# Patient Record
Sex: Female | Born: 1975 | Race: Black or African American | Hispanic: No | Marital: Single | Smoking: Never smoker
Health system: Southern US, Community
[De-identification: ages and names within clinical notes are randomized; demographics above are authoritative.]

## PROBLEM LIST (undated history)

## (undated) DIAGNOSIS — C792 Secondary malignant neoplasm of skin: Secondary | ICD-10-CM

## (undated) DIAGNOSIS — G47419 Narcolepsy without cataplexy: Secondary | ICD-10-CM

## (undated) DIAGNOSIS — C787 Secondary malignant neoplasm of liver and intrahepatic bile duct: Secondary | ICD-10-CM

## (undated) DIAGNOSIS — L509 Urticaria, unspecified: Secondary | ICD-10-CM

## (undated) DIAGNOSIS — C50919 Malignant neoplasm of unspecified site of unspecified female breast: Secondary | ICD-10-CM

## (undated) DIAGNOSIS — M419 Scoliosis, unspecified: Secondary | ICD-10-CM

## (undated) DIAGNOSIS — C50912 Malignant neoplasm of unspecified site of left female breast: Secondary | ICD-10-CM

## (undated) DIAGNOSIS — N3941 Urge incontinence: Secondary | ICD-10-CM

## (undated) HISTORY — DX: Urticaria, unspecified: L50.9

## (undated) HISTORY — DX: Secondary malignant neoplasm of skin: C79.2

## (undated) HISTORY — PX: HERNIA REPAIR: SHX51

## (undated) HISTORY — DX: Secondary malignant neoplasm of liver and intrahepatic bile duct: C78.7

## (undated) HISTORY — PX: SENTINEL NODE BIOPSY: SHX6608

## (undated) HISTORY — DX: Malignant neoplasm of unspecified site of left female breast: C50.912

## (undated) HISTORY — DX: Malignant neoplasm of unspecified site of unspecified female breast: C50.919

---

## 2016-01-03 HISTORY — PX: BREAST BIOPSY: SHX20

## 2017-10-21 DIAGNOSIS — C50812 Malignant neoplasm of overlapping sites of left female breast: Secondary | ICD-10-CM | POA: Insufficient documentation

## 2017-10-21 DIAGNOSIS — Z171 Estrogen receptor negative status [ER-]: Secondary | ICD-10-CM | POA: Insufficient documentation

## 2018-02-13 ENCOUNTER — Other Ambulatory Visit (HOSPITAL_COMMUNITY): Payer: Self-pay | Admitting: Internal Medicine

## 2018-02-13 DIAGNOSIS — C50919 Malignant neoplasm of unspecified site of unspecified female breast: Secondary | ICD-10-CM

## 2018-02-20 ENCOUNTER — Encounter (HOSPITAL_COMMUNITY): Payer: Self-pay

## 2018-02-20 ENCOUNTER — Ambulatory Visit (HOSPITAL_COMMUNITY)
Admission: RE | Admit: 2018-02-20 | Discharge: 2018-02-20 | Disposition: A | Discharge status: Home / Self Care | Source: Ambulatory Visit | Attending: Internal Medicine | Admitting: Internal Medicine

## 2018-02-20 DIAGNOSIS — C773 Secondary and unspecified malignant neoplasm of axilla and upper limb lymph nodes: Secondary | ICD-10-CM | POA: Insufficient documentation

## 2018-02-20 DIAGNOSIS — Z9889 Other specified postprocedural states: Secondary | ICD-10-CM | POA: Insufficient documentation

## 2018-02-20 DIAGNOSIS — C781 Secondary malignant neoplasm of mediastinum: Secondary | ICD-10-CM | POA: Diagnosis not present

## 2018-02-20 DIAGNOSIS — C50912 Malignant neoplasm of unspecified site of left female breast: Secondary | ICD-10-CM | POA: Insufficient documentation

## 2018-02-20 DIAGNOSIS — C787 Secondary malignant neoplasm of liver and intrahepatic bile duct: Secondary | ICD-10-CM | POA: Insufficient documentation

## 2018-02-20 DIAGNOSIS — J90 Pleural effusion, not elsewhere classified: Secondary | ICD-10-CM | POA: Diagnosis not present

## 2018-02-20 DIAGNOSIS — C50919 Malignant neoplasm of unspecified site of unspecified female breast: Secondary | ICD-10-CM

## 2018-02-20 DIAGNOSIS — C771 Secondary and unspecified malignant neoplasm of intrathoracic lymph nodes: Secondary | ICD-10-CM | POA: Insufficient documentation

## 2018-02-20 DIAGNOSIS — R590 Localized enlarged lymph nodes: Secondary | ICD-10-CM | POA: Insufficient documentation

## 2018-02-20 LAB — GLUCOSE, CAPILLARY: Glucose-Capillary: 78 mg/dL (ref 65–99)

## 2018-02-20 MED ORDER — FLUDEOXYGLUCOSE F - 18 (FDG) INJECTION
5.5000 | Freq: Once | INTRAVENOUS | Status: AC | PRN
  Administered 2018-02-20: 5.5 via INTRAVENOUS

## 2018-08-05 ENCOUNTER — Encounter (HOSPITAL_COMMUNITY): Payer: Self-pay

## 2018-08-05 ENCOUNTER — Emergency Department (HOSPITAL_COMMUNITY)
Admission: EM | Admit: 2018-08-05 | Discharge: 2018-08-05 | Disposition: A | Discharge status: Home / Self Care | Attending: Emergency Medicine | Admitting: Emergency Medicine

## 2018-08-05 ENCOUNTER — Other Ambulatory Visit: Payer: Self-pay

## 2018-08-05 DIAGNOSIS — T7840XA Allergy, unspecified, initial encounter: Secondary | ICD-10-CM | POA: Diagnosis not present

## 2018-08-05 HISTORY — DX: Scoliosis, unspecified: M41.9

## 2018-08-05 HISTORY — DX: Narcolepsy without cataplexy: G47.419

## 2018-08-05 MED ORDER — METHYLPREDNISOLONE 4 MG PO TBPK: ORAL_TABLET | ORAL | 0 refills | Status: DC

## 2018-08-05 MED ORDER — EPINEPHRINE 0.3 MG/0.3ML IJ SOAJ: 0.3000 mg | Freq: Once | INTRAMUSCULAR | 0 refills | Status: AC

## 2018-08-05 NOTE — Discharge Instructions (Addendum)
Try taking scheduled benadryl. If that doesn't help, I've given you a prescription for a steroid taper that you can try. Please follow up with your outpatient doctor to discuss restarting your home medications/supplements.   If you experience worsening swelling that leads to difficulty breathing, return to the emergency department.

## 2018-08-05 NOTE — ED Triage Notes (Signed)
Pt c/o mouth and tongue swelling. Pt is on treatment for breast cancer. Pt started new medication and noticed Monday that her right of her mouth was swelling. Pt took benadryl and symptoms resolved. Then the next day left side of mouth was swollen. Pt denies SOB or difficulty.

## 2018-08-05 NOTE — ED Provider Notes (Signed)
[facility] Provider Note   CSN: [id number] Arrival date & time: 08/05/18  3086     History   Chief Complaint Chief Complaint  Patient presents with  . Allergic Reaction    HPI Taylor Flynn is a 42 y.o. female with ER-/PR-/HER2+ stage IV breast cancer currently receiving chemo infusions (paclitaxel, trastuzumab, pertuzumab) along with a long list of herbs/supplements who presents for evaluation of mouth swelling.   She reports starting a new medication Sunday (methotrexate 2.35m every Sunday/Wednesday) and woke up the next morning with right sided lip swelling that resolved with benadryl. The next morning she woke up with left sided lip swelling that resolved on its own throughout the day. This morning she woke up with left sided tongue and left buccal swelling. She reports difficulty speaking but no respiratory distress. She was concerned about the migratory nature of her swelling and the fact that it has been traveling towards her oropharynx.   She has stopped taking all her medications/supplements. Her swelling has almost completely resolved by the time of my encounter.   She denies any issues breathing or swallowing, denies cough, chest pain, rhinorrhea, sneezing, congestion, ear pain, fevers, chills, dysuria or other skin lesions.   ROS is positive for subjectively swollen abdomen and itchy eyes.   For her breast cancer, she elected to pursue alternative therapies with a naturopathic provider including vitamin D, vitamin C, iodine, selenium, magnesium, mushrooms, Chinese herbals, and enemas. She also received infusions of vitamin B-17.    Past Medical History:  Diagnosis [place]  [place]   . Narcolepsy   . Scoliosis     There are no active problems to display for this patient.   Past Surgical History:  Procedure Laterality Date  . CESAREAN SECTION    . HERNIA REPAIR       OB History   None      Home Medications     Prior to Admission medications   Medication Sig Start Date End Date Taking? Authorizing Provider  methylPREDNISolone (MEDROL DOSEPAK) 4 MG TBPK tablet Take dosepak as prescribed 08/05/18   VIsabelle Course MD  paclitaxel, trastuzumab, pertuzumab  vitamin D, vitamin C, iodine, selenium, magnesium, mushrooms, Chinese herbals, and enemas. She also received infusions of vitamin B-17.    Family History No family history on file.  Social History Social History   Tobacco Use  . Smoking status: Never Smoker  . Smokeless tobacco: Never Used  Substance Use Topics  . Alcohol use: Not Currently    Frequency: Never  . Drug use: Never     Allergies   Patient has no allergy information on record.   Review of Systems Review of Systems  See HPI  Physical Exam Updated Vital Signs BP 113/70   Pulse 70   Temp 97.7 F (36.5 C) (Oral)   Resp 16   Ht '5\' 4"'  (1.626 m)   Wt 52.6 kg   LMP 07/14/2018 (Exact Date)   SpO2 100%   BMI 19.91 kg/m   Physical Exam Vitals:   08/05/18 0716 08/05/18 0730 08/05/18 0815  BP:  118/73 113/70  Pulse:  66 70  Resp:  16 16  Temp:  97.7 F (36.5 C)   TempSrc:  Oral   SpO2:  100% 100%  Weight: 52.6 kg    Height: '5\' 4"'  (1.626 m)     General: Vital signs reviewed.  Patient is well-developed and well-nourished, in no acute distress and cooperative with exam.  Sitting up in bed. Talking comfortably.  Head: Normocephalic and atraumatic. Eyes: EOMI, conjunctivae normal, no scleral icterus. No discharge or erythema  Mouth: slight swelling of left tongue, oropharynx erythematous with out exudate or obstruction. No mucosal lesions. No lip swelling. Nose: nasal turbinates pink without swelling, clear mucus noted.  Neck: Supple, trachea midline, normal ROM, masses Pulmonary/Chest: Port-a-cath in right upper chest. Left breast retracted with scattered nodules and skin lesions, no drainage or foul odor from skin lesions. Clear to auscultation bilaterally, no  wheezes, rales, or rhonchi. Abdominal: Soft, non-tender, non-distended, no masses, organomegaly, or guarding present. Two 2x3cm erythematous macules on abdomen from mistletoe injections.  Musculoskeletal: No joint deformities, erythema, or stiffness, ROM full and nontender. Extremities: No lower extremity edema bilaterally,  pulses symmetric and intact bilaterally. No cyanosis or clubbing. Neurological: A&O x3, Strength is normal and symmetric bilaterally, cranial nerve II-XII are grossly intact, no focal motor deficit, sensory intact to light touch bilaterally.  Skin: Warm, dry and intact.  Psychiatric: Normal mood and affect. speech and behavior is normal. Cognition and memory are normal.    ED Treatments / Results  Labs (all labs ordered are listed, but only abnormal results are displayed) Labs Reviewed - No data to display  EKG None  Radiology No results found.  Procedures Procedures (including critical care time)  Medications Ordered in ED Medications - No data to display   Initial Impression / Assessment and Plan / ED Course  I have reviewed the triage vital signs and the nursing notes.  Pertinent labs & imaging results that were available during my care of the patient were reviewed by me and considered in my medical decision making (see chart for details).   42yo female with ER-/PR-/HER2+ stage IV breast cancer currently receiving chemo infusions (paclitaxel, trastuzumab, pertuzumab) along with a long list of herbs/supplements who presents for evaluation of mouth swelling. Timeline correlates with starting low dose methotrexate. She is taking a long list of herbs/supplements that could be interacting. Mucosal swelling waxes and wanes. No other dermatologic involvement, no nausea/vomiting/diarrhea, she otherwise feels in her usual state of health. Denies respiratory distress and swelling has almost completely resolved. Unlikely Remo Lipps Johnson's syndrome due to lack of skin  rash.   She is stable for discharge. Swelling has resolved. Recommend scheduled benadryl. Will prescribe steroid dosepak in case scheduled benadryl does not work. Given return precautions if swelling worsens.   Final Clinical Impressions(s) / ED Diagnoses   Final diagnoses:  Allergic reaction, initial encounter    ED Discharge Orders         Ordered    methylPREDNISolone (MEDROL DOSEPAK) 4 MG TBPK tablet     08/05/18 8329           Isabelle Course, MD 08/05/18 1916    Drenda Freeze, MD 08/07/18 2149

## 2018-08-14 ENCOUNTER — Other Ambulatory Visit (HOSPITAL_COMMUNITY): Payer: Self-pay | Admitting: Family Medicine

## 2018-08-14 DIAGNOSIS — R140 Abdominal distension (gaseous): Secondary | ICD-10-CM

## 2018-08-14 DIAGNOSIS — C50919 Malignant neoplasm of unspecified site of unspecified female breast: Secondary | ICD-10-CM

## 2018-08-18 ENCOUNTER — Encounter: Payer: Self-pay | Admitting: Allergy

## 2018-08-18 ENCOUNTER — Ambulatory Visit (INDEPENDENT_AMBULATORY_CARE_PROVIDER_SITE_OTHER): Admitting: Allergy

## 2018-08-18 VITALS — BP 124/80 | HR 82 | Temp 97.5°F | Resp 16 | Ht 64.4 in | Wt 115.6 lb

## 2018-08-18 DIAGNOSIS — T783XXD Angioneurotic edema, subsequent encounter: Secondary | ICD-10-CM

## 2018-08-18 NOTE — Patient Instructions (Addendum)
Angioedema    - at this time believe most likely related to aspirin use as angioedema has been seen in aspirin moreso than MTX.      - would continue avoidance of MTX and aspirin (and all NSAIDs) until able to challenge.  Recommend in-office challenge first to aspirin no sooner than 6 weeks from initial incident    - will also rule out Hereditary angioedema which presents with episodic swelling which usually does not respond to antihistamines or steroids    - consider Loratadine 10mg daily to help suppress angioedema episodes.     - you have access to Epipen in case of severe reaction.    Follow-up for challenge in office 

## 2018-08-18 NOTE — Progress Notes (Signed)
New Taylor Flynn Note  RE: Taylor Flynn MRN: [id number] DOB: May 23, 1976 Date of Office Visit: 08/18/2018  Referring provider: [name], MD Primary care provider: Patient, No Pcp Per  Chief Complaint: allergic reaction  History of present illness: Taylor Flynn is a 42 y.o. female presenting today for evaluation of possible allergic reaction.  She has a history of stage IV breast cancer currently receiving chemotherapy (paclitaxel, trastuzumab, pertuzumab).   She also follows with a naturopathic provider for alternative therapies including vit D, vit C, iodine, selenium, magnesium, mushrooms, chinese herbals and enemas as well as vit b17 infusions.   She presents today as she believes she may be allergic to MTX or aspirin.   She states there have been studies to these medications that have been effective in cancer therapy as to why she was recommended to use these medications by Dr. Cherylann Parr.    She states she started MTX and apsirin at same times about 2-3 weeks ago.  She took MTX around dinner and aspirin before bedtime.  She woke up next morning and her bottom lip was swollen (provided with pictures with obvious angioedema).  She states the lip continued to swell over several hours. She took benadryl which did help.  The lip swelling resolved over the course of the day.  She stopped the MTX but continued the aspirin and took she states for at least 2-3 days total.  The next morning her upper top lip on left side was swollen and included buccal mucosa swelling as well.   She took benadryl and states didn't work as well as the day before.  The next morning she states the back of her tongue was swollen (does provide picture with edema of half of tongue).  She states she did have some difficulty swallowing with this but denies any difficulty breathing, no cough, wheeze or stridor.  She went to the ED with the tongue swelling.  She state she took benadryl and by the time she got to the ED the  tongue swelling had improved.  In the ED 08/05/18 on exam was noted to have "slight swelling of left tongue, oropharynx erythematous without exudate or obstruction.  No mucosal lesions. No lip swelling."   She was prescribed a steroid dosepak.  She was prescribed an epipen as well.    She reports the only meat she eats is wild salmon.  Normally she eats a lot of salad.  She reports she does eat some ghee.  She does not recall exactly what she ate for dinner the night before swelling started but states she does not eat red meat products.      She denies any stings prior to onset of swelling.  She states she has been bitten but variety of different bugs while living with her brother and most recently developed cellulitis after a presume fire ant bite.    She denies history of eczema, food allergy or environmental allergy symptoms.    Review of systems: Review of Systems  Constitutional: Negative for chills, fever and malaise/fatigue.  HENT: Negative for congestion, ear discharge, nosebleeds, sinus pain and sore throat.   Eyes: Negative for pain, discharge and redness.  Respiratory: Negative for cough, shortness of breath and wheezing.   Cardiovascular: Negative for chest pain.  Gastrointestinal: Negative for abdominal pain, constipation, diarrhea, nausea and vomiting.  Skin: Negative for itching and rash.  Neurological: Negative for headaches.    All other systems negative unless noted above in HPI  Past  medical history: Past Medical History:  Diagnosis Date  . Cancer ([place])   . Cellulitis   . Narcolepsy   . Scoliosis     Past surgical history: Past Surgical History:  Procedure Laterality Date  . CESAREAN SECTION    . HERNIA REPAIR      Family history:  Family History  Problem Relation Age of Onset  . Aortic aneurysm Mother   . Hypertension Mother   . Dementia Father   . Asthma Sister   . Asthma Brother   . Breast cancer Maternal Aunt   . Diabetes Maternal Grandmother   .  Leukemia Maternal Grandmother   . Prostate cancer Maternal Grandfather   . Eczema Daughter     Social history: Currently lives in a home with her brother with carpeting with electric heating and central cooling.  There are cats in the home.  Dogs and cats outside the home.  She has seen variety of different bugs in the home including carpet bug larvae.  No concern for water damage, mildew or roaches in the home.  She is a Teacher, music and reservist.    Medication List: Allergies as of 08/18/2018      Reactions   Sulfa Antibiotics Rash      Medication List        Accurate as of 08/18/18 12:19 PM. Always use your most recent med list.          cephALEXin 250 MG capsule Commonly known as:  KEFLEX TK TWO CS PO Q 6 H   cyclophosphamide 50 MG capsule Commonly known as:  CYTOXAN Take 1 capsule by mouth daily.   DANDELION ROOT PO Take 3 capsules by mouth daily.   ELTA SILVERGEL EX Apply topically 3 (three) times daily.   EPINEPHrine 0.3 mg/0.3 mL Soaj injection Commonly known as:  EPI-PEN INJECT 0.3ML  INTRAMUSCULARLY ONCE FOR 1 DOSE AS DIRECTED   EUROPEAN MISTLETOE [place] Inject into the skin 3 (three) times a week.   GINSENG PO Take 2 capsules by mouth 2 (two) times daily.   HERCEPTIN IV Inject into the vein every 21 ( twenty-one) days. Combined with Perjeta and Zometa   ivermectin 3 MG Tabs tablet Commonly known as:  STROMECTOL TAKE 2 TABLETS BY MOUTH BID FOR 2 WEEKS THEN 2 TABLETS EVERY DAY THEREAFTER   MELATONIN PO Take 20 mg by mouth at bedtime.   NALTREXONE HCL PO Take 4.5 mg by mouth at bedtime.   NUTRITIONAL SUPPLEMENT PO Take by mouth. Haelen (fermented soy) 1 oz.- BID Chinese Herbs 4 oz- BID Alcide Goodness- 1 capsule - QHS DesBio Leaky Gut Formula- 10 drops- TID Bravo Europe (Colostrum)- 1 capsule- QHS Graviola extract- 40 drops- TID Hydrate II - 10 drops -TID Immunis - 3 drops- TID Metatrol Pro ( Fermented wheat grem extract)- 2 capsules-  daily Biohackit ( FenbenCur)- 4 capsules- 3 nights per week Lymph-Tone II - 1 dropper- BID Salvestrol- 4 capsules- every morning BrocElite- 2 capsules- TID   PERJETA IV Inject into the vein every 21 ( twenty-one) days. Combined with Herceptin and Zometa   UNABLE TO FIND 60 mg 3 (three) times daily. Tetrathiomolybdate   Vitamin D3 5000 units Tabs Take by mouth daily.   ZOMETA IV Inject into the vein every 21 ( twenty-one) days. Combined with Herceptin and Perjeta       Known medication allergies: Allergies  Allergen Reactions  . Sulfa Antibiotics Rash     Physical examination: Blood pressure 124/80, pulse 82, temperature (!) 97.5  F (36.4 C), temperature source Oral, resp. rate 16, height 5' 4.4" (1.636 m), weight 115 lb 9.6 oz (52.4 kg).  General: Alert, interactive, in no acute distress. HEENT: PERRLA, TMs pearly gray, turbinates non-edematous without discharge, post-pharynx non erythematous.  No edema or oral mucosal lesions. Neck: Supple without lymphadenopathy. Lungs: Clear to auscultation without wheezing, rhonchi or rales. {no increased work of breathing. CV: Normal S1, S2 without murmurs. Abdomen: Nondistended, nontender. Skin: Warm and dry, without lesions or rashes. Extremities:  No clubbing, cyanosis or edema. Neuro:   Grossly intact.  Diagnositics/Labs: None today   Assessment and plan:   Angioedema    - at this time believe most likely related to aspirin use as angioedema has been associated with aspirin use moreso than MTX.      - would continue avoidance of MTX and aspirin (and all NSAIDs) until able to challenge.  Recommend in-office challenge first to aspirin no sooner than 6 weeks from initial incident    - will also rule out Hereditary angioedema which presents with episodic swelling which usually does not respond to antihistamines or steroids    - consider Loratadine 10mg  daily to help suppress angioedema episodes.      - you have access to  Epipen in case of severe reaction.    Follow-up for challenge in office   I appreciate the opportunity to take part in Lanita's care. Please do not hesitate to contact me with questions.  Sincerely,   Prudy Feeler, MD Allergy/Immunology [facility]

## 2018-08-21 ENCOUNTER — Ambulatory Visit (HOSPITAL_COMMUNITY)
Admission: RE | Admit: 2018-08-21 | Discharge: 2018-08-21 | Disposition: A | Discharge status: Home / Self Care | Source: Ambulatory Visit | Attending: Family Medicine | Admitting: Family Medicine

## 2018-08-21 DIAGNOSIS — C50919 Malignant neoplasm of unspecified site of unspecified female breast: Secondary | ICD-10-CM | POA: Insufficient documentation

## 2018-08-21 DIAGNOSIS — J90 Pleural effusion, not elsewhere classified: Secondary | ICD-10-CM | POA: Insufficient documentation

## 2018-08-21 DIAGNOSIS — R140 Abdominal distension (gaseous): Secondary | ICD-10-CM

## 2018-08-21 LAB — GLUCOSE, CAPILLARY: Glucose-Capillary: 80 mg/dL (ref 70–99)

## 2018-08-21 MED ORDER — FLUDEOXYGLUCOSE F - 18 (FDG) INJECTION
5.7700 | Freq: Once | INTRAVENOUS | Status: AC | PRN
  Administered 2018-08-21: 5.77 via INTRAVENOUS

## 2018-09-16 ENCOUNTER — Encounter: Payer: Self-pay | Admitting: Allergy

## 2018-09-16 ENCOUNTER — Ambulatory Visit: Admitting: Allergy

## 2018-09-16 LAB — TRYPTASE: Tryptase: 4.4 ug/L (ref 2.2–13.2)

## 2018-09-16 LAB — HAE INTERPRETATION:

## 2018-09-16 LAB — HEREDITARY ANGIOEDEMA
C2 Esterase Inhibitor, Serum: 29 mg/dL (ref 21–39)
Complement C4, Serum: 30 mg/dL (ref 14–44)

## 2018-09-16 LAB — C1 ESTERASE INHIBITOR, FUNC: C1 Est.Inhib.Funct.: 80 %{normal}

## 2018-09-16 NOTE — Patient Instructions (Signed)
Angioedema    - at this time believe most likely related to aspirin use as angioedema has been seen in aspirin moreso than MTX.      - would continue avoidance of MTX and aspirin (and all NSAIDs) until able to challenge.  Recommend in-office challenge first to aspirin no sooner than 6 weeks from initial incident    - will also rule out Hereditary angioedema which presents with episodic swelling which usually does not respond to antihistamines or steroids    - consider Loratadine 10mg  daily to help suppress angioedema episodes.     - you have access to Epipen in case of severe reaction.    Follow-up for challenge in office

## 2018-09-17 ENCOUNTER — Encounter: Payer: Self-pay | Admitting: Hematology & Oncology

## 2018-09-21 ENCOUNTER — Telehealth: Payer: Self-pay | Admitting: Hematology & Oncology

## 2018-09-21 NOTE — Telephone Encounter (Signed)
Spoke with patient to confirm new patient appt 10/05/18 at 3:15 pm

## 2018-09-25 ENCOUNTER — Encounter: Payer: Self-pay | Admitting: Family Medicine

## 2018-09-25 ENCOUNTER — Ambulatory Visit (INDEPENDENT_AMBULATORY_CARE_PROVIDER_SITE_OTHER): Admitting: Family Medicine

## 2018-09-25 VITALS — BP 126/66 | HR 63 | Resp 16

## 2018-09-25 DIAGNOSIS — T783XXD Angioneurotic edema, subsequent encounter: Secondary | ICD-10-CM | POA: Diagnosis not present

## 2018-09-25 DIAGNOSIS — T783XXA Angioneurotic edema, initial encounter: Secondary | ICD-10-CM

## 2018-09-25 HISTORY — DX: Angioneurotic edema, initial encounter: T78.3XXA

## 2018-09-25 MED ORDER — FEXOFENADINE HCL 180 MG PO TABS: 180.0000 mg | ORAL_TABLET | Freq: Every day | ORAL | 5 refills | Status: DC

## 2018-09-25 NOTE — Patient Instructions (Addendum)
Aspirin challenge Taylor Flynn was able to tolerate the aspirin 81 mg challenge today at the office without adverse sighs or symptoms of an allergic reaction. Therefore, she has the same risk of systemic reaction associated with the consumption of aspirin products as the general population.  - Do not give any aspirin products for the next 24 hours. - Monitor for allergic symptoms such as rash, wheezing, diarrhea, swelling, and vomiting for the next 24 hours. If severe symptoms occur, treat with EpiPen injection and call 911. For less severe symptoms treat with Benadryl 50 mg every 4 hours and call the clinic.  - If no allergic symptoms are evident, reintroduce aspirin products as needed. If you develop an allergic reaction to aspirin, record what was eaten the amount eaten, preparation method, time from ingestion to reaction, and symptoms.  - Consider beginning an antihistamine once a day to suppress swelling. Allegra 180 is the least sedating followed by Xyzal 5 mg, and then cetirizine 10 mg.    Follow up in this clinic as needed. Please feel free to call with any questions or concerns.

## 2018-09-25 NOTE — Progress Notes (Signed)
[place] [address] Dept: [phone]  FOLLOW UP NOTE  Patient ID: Taylor Flynn, female    DOB: [birth year]  Age: 42 y.o. MRN: [id number] Date of Office Visit: 09/25/2018  Assessment  Chief Complaint: Food/Drug Challenge  HPI Taylor Flynn is a 42 year old female who presents to the clinic for an aspirin office challenge. She reports she has not taken any aspirin for the last 6 weeks. She has experienced lip swelling and swelling of the medial aspect of er left heel. She has taken antihistamines on an as needed basis which has decreased the swelling.    Drug Allergies:  Allergies  Allergen Reactions  . Sulfa Antibiotics Rash    Physical Exam: BP 126/66 (BP Location: Right Arm, Patient Position: Sitting, Cuff Size: Normal)   Pulse 63   Resp 16   SpO2 97%    Physical Exam  Constitutional: She is oriented to person, place, and time. She appears well-developed and well-nourished.  HENT:  Head: Normocephalic.  Right Ear: External ear normal.  Left Ear: External ear normal.  Nose: Nose normal.  Mouth/Throat: Oropharynx is clear and moist.  Eyes: Conjunctivae are normal.  Neck: Normal range of motion. Neck supple.  Cardiovascular: Normal rate, regular rhythm and normal heart sounds.  No murmur noted  Pulmonary/Chest: Effort normal and breath sounds normal.  Lungs clear to auscultation  Musculoskeletal: Normal range of motion.  Neurological: She is alert and oriented to person, place, and time.  Skin: Skin is warm and dry.  Inner aspect of right heel area with redness and localized swelling noted before the aspirin challenge started.  Vitals reviewed.   Procedure note: Open graded aspirin 81 mg challenge: Verbal consent obtained. The patient was able to tolerate the challenge today without adverse signs or symptoms. Vital signs were stable throughout the challenge and observation period. She received multiple doses separated by 20 minutes, each of  which was separated by vitals and a brief physical exam. She received the following doses: 1%, 10%, and 89% of one aspirin 81 mg. She was monitored for 60 minutes following the last dose.    Assessment and Plan: 1. Angioedema, subsequent encounter     Meds ordered this encounter  Medications  . fexofenadine (ALLEGRA) 180 MG tablet    Sig: Take 1 tablet (180 mg total) by mouth daily.    Dispense:  31 tablet    Refill:  5    Patient Instructions  Aspirin challenge Taylor Flynn was able to tolerate the aspirin 81 mg challenge today at the office without adverse sighs or symptoms of an allergic reaction. Therefore, she has the same risk of systemic reaction associated with the consumption of aspirin products as the general population.  - Do not give any aspirin products for the next 24 hours. - Monitor for allergic symptoms such as rash, wheezing, diarrhea, swelling, and vomiting for the next 24 hours. If severe symptoms occur, treat with EpiPen injection and call 911. For less severe symptoms treat with Benadryl 50 mg every 4 hours and call the clinic.  - If no allergic symptoms are evident, reintroduce aspirin products as needed. If you develop an allergic reaction to aspirin, record what was eaten the amount eaten, preparation method, time from ingestion to reaction, and symptoms.  - Consider beginning an antihistamine once a day to suppress swelling. Allegra 180 is the least sedating followed by Xyzal 5 mg, and then cetirizine 10 mg.    Follow up in this clinic  as needed. Please feel free to call with any questions or concerns.    Return if symptoms worsen or fail to improve.    Thank you for the opportunity to care for this patient.  Please do not hesitate to contact me with questions.  Gareth Morgan, FNP [facility]

## 2018-09-29 ENCOUNTER — Encounter: Admitting: Allergy

## 2018-10-05 ENCOUNTER — Other Ambulatory Visit: Payer: Self-pay

## 2018-10-05 ENCOUNTER — Inpatient Hospital Stay

## 2018-10-05 ENCOUNTER — Inpatient Hospital Stay: Attending: Hematology & Oncology | Admitting: Hematology & Oncology

## 2018-10-05 ENCOUNTER — Encounter: Payer: Self-pay | Admitting: Hematology & Oncology

## 2018-10-05 VITALS — BP 103/59 | HR 75 | Resp 16 | Ht 64.0 in | Wt 113.0 lb

## 2018-10-05 DIAGNOSIS — C50919 Malignant neoplasm of unspecified site of unspecified female breast: Secondary | ICD-10-CM

## 2018-10-05 DIAGNOSIS — C787 Secondary malignant neoplasm of liver and intrahepatic bile duct: Principal | ICD-10-CM

## 2018-10-05 DIAGNOSIS — L989 Disorder of the skin and subcutaneous tissue, unspecified: Secondary | ICD-10-CM | POA: Insufficient documentation

## 2018-10-05 DIAGNOSIS — Z79899 Other long term (current) drug therapy: Secondary | ICD-10-CM | POA: Insufficient documentation

## 2018-10-05 DIAGNOSIS — C50912 Malignant neoplasm of unspecified site of left female breast: Secondary | ICD-10-CM | POA: Diagnosis not present

## 2018-10-05 DIAGNOSIS — O039 Complete or unspecified spontaneous abortion without complication: Secondary | ICD-10-CM | POA: Insufficient documentation

## 2018-10-05 DIAGNOSIS — I071 Rheumatic tricuspid insufficiency: Secondary | ICD-10-CM | POA: Insufficient documentation

## 2018-10-05 DIAGNOSIS — C773 Secondary and unspecified malignant neoplasm of axilla and upper limb lymph nodes: Secondary | ICD-10-CM | POA: Insufficient documentation

## 2018-10-05 DIAGNOSIS — Z5112 Encounter for antineoplastic immunotherapy: Secondary | ICD-10-CM | POA: Insufficient documentation

## 2018-10-05 DIAGNOSIS — C792 Secondary malignant neoplasm of skin: Secondary | ICD-10-CM

## 2018-10-05 DIAGNOSIS — Z171 Estrogen receptor negative status [ER-]: Secondary | ICD-10-CM | POA: Insufficient documentation

## 2018-10-05 DIAGNOSIS — N898 Other specified noninflammatory disorders of vagina: Secondary | ICD-10-CM | POA: Insufficient documentation

## 2018-10-05 DIAGNOSIS — O09529 Supervision of elderly multigravida, unspecified trimester: Secondary | ICD-10-CM | POA: Insufficient documentation

## 2018-10-05 LAB — CBC WITH DIFFERENTIAL (CANCER CENTER ONLY)
Abs Immature Granulocytes: 0.04 10*3/uL (ref 0.00–0.07)
Basophils Absolute: 0 10*3/uL (ref 0.0–0.1)
Basophils Relative: 0 %
Eosinophils Absolute: 0.1 10*3/uL (ref 0.0–0.5)
Eosinophils Relative: 2 %
HCT: 32.9 % — ABNORMAL LOW (ref 36.0–46.0)
Hemoglobin: 10.3 g/dL — ABNORMAL LOW (ref 12.0–15.0)
Immature Granulocytes: 0 %
Lymphocytes Relative: 20 %
Lymphs Abs: 1.8 10*3/uL (ref 0.7–4.0)
MCH: 29.3 pg (ref 26.0–34.0)
MCHC: 31.3 g/dL (ref 30.0–36.0)
MCV: 93.5 fL (ref 80.0–100.0)
Monocytes Absolute: 0.6 10*3/uL (ref 0.1–1.0)
Monocytes Relative: 7 %
Neutro Abs: 6.4 10*3/uL (ref 1.7–7.7)
Neutrophils Relative %: 71 %
Platelet Count: 278 10*3/uL (ref 150–400)
RBC: 3.52 MIL/uL — ABNORMAL LOW (ref 3.87–5.11)
RDW: 14.6 % (ref 11.5–15.5)
WBC Count: 9 10*3/uL (ref 4.0–10.5)
nRBC: 0 % (ref 0.0–0.2)

## 2018-10-05 LAB — CMP (CANCER CENTER ONLY)
ALT: 7 U/L (ref 0–44)
AST: 14 U/L — ABNORMAL LOW (ref 15–41)
Albumin: 4.4 g/dL (ref 3.5–5.0)
Alkaline Phosphatase: 58 U/L (ref 38–126)
Anion gap: 8 (ref 5–15)
BUN: 7 mg/dL (ref 6–20)
CO2: 28 mmol/L (ref 22–32)
Calcium: 9.1 mg/dL (ref 8.9–10.3)
Chloride: 104 mmol/L (ref 98–111)
Creatinine: 0.91 mg/dL (ref 0.44–1.00)
GFR, Est AFR Am: >60 mL/min (ref >60)
GFR, Estimated: >60 mL/min (ref >60)
Glucose, Bld: 78 mg/dL (ref 70–99)
Potassium: 3.6 mmol/L (ref 3.5–5.1)
Sodium: 140 mmol/L (ref 135–145)
Total Bilirubin: 0.5 mg/dL (ref 0.3–1.2)
Total Protein: 7 g/dL (ref 6.5–8.1)

## 2018-10-06 LAB — CANCER ANTIGEN 27.29: CA 27.29: 21 U/mL (ref 0.0–38.6)

## 2018-10-08 ENCOUNTER — Telehealth: Payer: Self-pay | Admitting: Hematology & Oncology

## 2018-10-08 ENCOUNTER — Encounter: Payer: Self-pay | Admitting: Hematology & Oncology

## 2018-10-08 DIAGNOSIS — C50912 Malignant neoplasm of unspecified site of left female breast: Secondary | ICD-10-CM

## 2018-10-08 DIAGNOSIS — C787 Secondary malignant neoplasm of liver and intrahepatic bile duct: Secondary | ICD-10-CM | POA: Insufficient documentation

## 2018-10-08 DIAGNOSIS — C792 Secondary malignant neoplasm of skin: Secondary | ICD-10-CM

## 2018-10-08 DIAGNOSIS — C50919 Malignant neoplasm of unspecified site of unspecified female breast: Secondary | ICD-10-CM

## 2018-10-08 HISTORY — DX: Secondary malignant neoplasm of liver and intrahepatic bile duct: C78.7

## 2018-10-08 HISTORY — DX: Malignant neoplasm of unspecified site of unspecified female breast: C50.919

## 2018-10-08 HISTORY — DX: Malignant neoplasm of unspecified site of left female breast: C50.912

## 2018-10-08 NOTE — Progress Notes (Signed)
Referral MD  Reason for Referral: Metastatic breast cancer --  ER-/HER2+  Chief Complaint  Patient presents with  . 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  Even more importantly is the fact that she went to [employer] school of medicine.  As such, we had a great time talking about that.  She presented in April 2017 with a mass in the left breast.  Unfortunately, she had noted the mass for quite a while before then.  However, she decided not to pursue any therapy for it.  She subsequently had a biopsy.  This showed an invasive ductal carcinoma.  It was ER negative/PR negative/HER-2 positive.  She underwent MRI of the breast.  This showed a 2.6 x 4 x 2.4 cm mass in the medial superior portion of the left breast.  There was enlarged axillary lymph nodes on the left.  She pursued alternative therapies.  Unfortunately, the tumor continued to grow.  She underwent a PET scan in January 2019.  The left breast mass was now 8.7 cm.  She had metabolic activity in the liver, both supraclavicular regions and both axillary nodes.  She was tested for BRCA 1 and BRCA2.  These were negative.  She elected to undergo continued alternative therapies.  She did receive Herceptin/Perjeta.  I think she also received CMF/Adriamycin.  I suspect that these were given elsewhere.  She had been seen out in [place].  She has lost about 50 pounds.  [facility].  In December 2018, she was seen at [facility].  She was told that she had stage IV disease.  She was not considered a surgical candidate.  It was recommended that she undergo dual anti-HER-2 therapy along with cytotoxic therapy.  She continued on a naturopathic course.  She again was out in [place] where she was receiving low-dose chemotherapy with CMF and  Adriamycin.  She also received insulin.  She was started on the Perjeta/Herceptin.  She says that her tumor has began to respond.  She was seen in April 2019 at [facility] oncology.  She was seen by Dr. Servando Salina.  It was recommended that she receive dual Her-2 therapy along with a taxane.  She consented to the Perjeta/Herceptin and Zometa.  She had a repeat PET scan on 02/20/2018.  It looks as if she was responding.  She is being treated at [facility] hematology/oncology in [facility].  She is being treated by Dr. Lorna Few.  Dr. Megan Salon has really done a great job in trying to help her and trying to treat her malignancy.  She had a PET scan done on 08/21/2018.  Shockingly enough, the PET scan did not show any evidence of metastatic disease.  She had very minimal uptake on the left chest wall.  She has moved to the [place] area.  As such, she would like to have her treatments done at our office.  We can certainly accommodate her.  She will receive Herceptin/Perjeta and Zometa on Tuesday at Dr. Hale Bogus office.  She understands that she is dealing with metastatic disease as not curable.  She looks pretty good.  She is eating pretty good.  She is had no problems with nausea or vomiting.  Overall, her performance status is ECOG 1..  She does not smoke.  She does not drink.  She has 2 daughters.  The first daughter was born when she was 35 years old.  She does not have her monthly cycles.   Past Medical History:  Diagnosis Date  . Cancer ([place])   . Cellulitis   . Narcolepsy   . Scoliosis   :  Past Surgical History:  Procedure Laterality Date  . CESAREAN SECTION    . HERNIA REPAIR    :   Current Outpatient Medications:  .  bicalutamide (CASODEX) 50 MG tablet, , Disp: , Rfl:  .  Cholecalciferol (VITAMIN D3) 5000 units TABS, Take by mouth daily., Disp: , Rfl:  .  cyclophosphamide (CYTOXAN) 50 MG capsule, Take 1 capsule by mouth daily., Disp: , Rfl:  .   ELTA SILVERGEL EX, Apply topically 3 (three) times daily., Disp: , Rfl:  .  EPINEPHrine 0.3 mg/0.3 mL IJ SOAJ injection, INJECT 0.3ML  INTRAMUSCULARLY ONCE FOR 1 DOSE AS DIRECTED, Disp: , Rfl: 0 .  EUROPEAN MISTLETOE [place], Inject into the skin 3 (three) times a week., Disp: , Rfl:  .  fexofenadine (ALLEGRA) 180 MG tablet, Take 1 tablet (180 mg total) by mouth daily., Disp: 31 tablet, Rfl: 5 .  GINSENG PO, Take 2 capsules by mouth 2 (two) times daily., Disp: , Rfl:  .  ivermectin (STROMECTOL) 3 MG TABS tablet, TAKE 2 TABLETS BY MOUTH BID FOR 2 WEEKS THEN 2 TABLETS EVERY DAY THEREAFTER, Disp: , Rfl: 0 .  MELATONIN PO, Take 20 mg by mouth at bedtime., Disp: , Rfl:  .  Misc Natural Products (DANDELION ROOT PO), Take 3 capsules by mouth daily., Disp: , Rfl:  .  NALTREXONE HCL PO, Take 4.5 mg by mouth at bedtime., Disp: , Rfl:  .  Nutritional Supplements (NUTRITIONAL SUPPLEMENT PO), Take by mouth. Haelen (fermented soy) 1 oz.- BID Chinese Herbs 4 oz- BID Alcide Goodness- 1 capsule - QHS DesBio Leaky Gut Formula- 10 drops- TID Bravo Europe (Colostrum)- 1 capsule- QHS Graviola extract- 40 drops- TID Hydrate II - 10 drops -TID Immunis - 3 drops- TID Metatrol Pro ( Fermented wheat grem extract)- 2 capsules- daily Biohackit ( FenbenCur)- 4 capsules- 3 nights per week Lymph-Tone II - 1 dropper- BID Salvestrol- 4 capsules- every morning BrocElite- 2 capsules- TID, Disp: , Rfl:  .  Pertuzumab (PERJETA IV), Inject into the vein every 21 ( twenty-one) days. Combined with Herceptin and Zometa, Disp: , Rfl:  .  Trastuzumab (HERCEPTIN IV), Inject into the vein every 21 ( twenty-one) days. Combined with Perjeta and Zometa, Disp: , Rfl:  .  UNABLE TO FIND, 60 mg 3 (three) times daily. Tetrathiomolybdate, Disp: , Rfl:  .  Zoledronic Acid (ZOMETA IV), Inject into the vein every 21 ( twenty-one) days. Combined with Herceptin and Perjeta, Disp: , Rfl: :  :  Allergies  Allergen Reactions  . Sulfa Antibiotics Rash  :  Family  History  Problem Relation Age of Onset  . Aortic aneurysm Mother   . Hypertension Mother   . Dementia Father   . Asthma Sister   . Asthma Brother   . Breast cancer Maternal Aunt   . Diabetes Maternal Grandmother   . Leukemia Maternal Grandmother   . Prostate cancer Maternal Grandfather   . Eczema Daughter   :  Social History   Socioeconomic History  . Marital status: Single    Spouse name: Not on file  . Number of children: Not on file  . Years of education: Not on file  . Highest education level: Not  on file  Occupational History  . Not on file  Social Needs  . Financial resource strain: Not on file  . Food insecurity:    Worry: Not on file    Inability: Not on file  . Transportation needs:    Medical: Not on file    Non-medical: Not on file  Tobacco Use  . Smoking status: Never Smoker  . Smokeless tobacco: Never Used  Substance and Sexual Activity  . Alcohol use: Not Currently    Frequency: Never  . Drug use: Never  . Sexual activity: Not on file  Lifestyle  . Physical activity:    Days per week: Not on file    Minutes per session: Not on file  . Stress: Not on file  Relationships  . Social connections:    Talks on phone: Not on file    Gets together: Not on file    Attends religious service: Not on file    Active member of club or organization: Not on file    Attends meetings of clubs or organizations: Not on file    Relationship status: Not on file  . Intimate partner violence:    Fear of current or ex partner: Not on file    Emotionally abused: Not on file    Physically abused: Not on file    Forced sexual activity: Not on file  Other Topics Concern  . Not on file  Social History Narrative  . Not on file  :  Review of Systems  Constitutional: Negative.   HENT: Negative.   Eyes: Negative.   Respiratory: Negative.   Gastrointestinal: Negative.   Genitourinary: Negative.   Musculoskeletal: Negative.   Skin: Negative.   Neurological: Negative.    Endo/Heme/Allergies: Negative.   Psychiatric/Behavioral: Negative.      Exam: Petite appearing African-American female in no obvious distress.  Her vital signs show a temperature of 98 2.  Pulse 75.  Blood pressure 103/59.  Weight is 113 pounds.  Head neck exam shows no ocular or oral lesions.  She has no scleral icterus.  There is no mucositis.  I really cannot palpate any obvious adenopathy on her neck.  Lungs are clear bilaterally.  Cardiac exam regular rate and rhythm with no murmurs, rubs or bruits.  Breast exam shows right breast with no masses, edema or erythema.  There is no right axillary adenopathy.  Left chest wall shows a residual areolar complex.  There is some slight erythema present.  There is a satellite lesion noted at about the 7 o'clock position.  There is some palpable adenopathy in the left axilla.  Abdomen is soft.  She has good bowel sounds.  There is no fluid wave.  There is no palpable abdominal mass.  Her liver edge is about 3 cm below the right costal margin.  Back exam shows no tenderness over the spine or ribs or hips.  There is no lymphedema in the left arm.  Extremities shows no clubbing cyanosis or edema, outside of the mild lymphedema of the left arm.  She has good range of motion of her joints.  Neurological exam is nonfocal.  Skin exam shows no rashes, ecchymoses or petechia. 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  Again, she graduated from [employer] of Medicine.  I realize that this is a very delicate case.  I know that she has her preferences with respect to naturopathic treatments.  She does know that the Herceptin/Perjeta are  helping.  From my perspective, it looks like the Herceptin/Perjeta are doing a good job right now.  I talked to her about the possibility of using Kadcyla.  I think this would be a very good option to consider if we see that she is progressing.  She does have this nodule on her skin on the upper left quadrant of her abdomen.  I suspect this is a skin metastasis.  We could use this as a measure of response.  One possibility also would be to biopsy this nodule.  We can then send off for genetic analysis.  She is going to go back to Dr. Megan Salon tomorrow to have her treatment.  After that, we will do her Herceptin and Perjeta in our office.  We will also think about a another PET scan on her in the near future.  Of note, her CA 27.29 is only 21.  As such, I am not sure we can really use this as a marker for disease response.  I know that [name] is definite Taylor Flynn confident in her choices of therapy.  I spent a good hour or so with her.  All the time spent face-to-face.  I also spent time getting her treatments coordinated so we can have them done here.  I would like to see her back when she is ready for her second cycle of therapy.  I answered all of her questions.  We talked about our experience is at [facility].

## 2018-10-08 NOTE — Telephone Encounter (Signed)
Spoke with pt to confirm 12/24 appt at 0930 per 12/5 LOS

## 2018-10-08 NOTE — Progress Notes (Signed)
START ON PATHWAY REGIMEN - Breast     A cycle is every 21 days:     Pertuzumab      Pertuzumab      Trastuzumab-xxxx      Trastuzumab-xxxx      Docetaxel   **Always confirm dose/schedule in your pharmacy ordering system**  Patient Characteristics: Distant Metastases or Locoregional Recurrent Disease - Unresected or Locally Advanced Unresectable Disease Progressing after Neoadjuvant and Local Therapies, HER2 Positive, ER Negative/Unknown, Chemotherapy, First Line Therapeutic Status: Distant Metastases BRCA Mutation Status: Absent ER Status: Negative (-) HER2 Status: Positive (+) PR Status: Negative (-) Line of Therapy: First Line Intent of Therapy: Non-Curative / Palliative Intent, Discussed with Patient

## 2018-10-20 ENCOUNTER — Telehealth: Payer: Self-pay | Admitting: Hematology & Oncology

## 2018-10-20 NOTE — Telephone Encounter (Signed)
lmom asking pt to return call to office to resch 12/24 appts per staff message 12/16

## 2018-10-27 ENCOUNTER — Inpatient Hospital Stay

## 2018-10-27 ENCOUNTER — Other Ambulatory Visit: Payer: Self-pay

## 2018-10-27 ENCOUNTER — Inpatient Hospital Stay (HOSPITAL_BASED_OUTPATIENT_CLINIC_OR_DEPARTMENT_OTHER): Admitting: Hematology & Oncology

## 2018-10-27 ENCOUNTER — Encounter: Payer: Self-pay | Admitting: Hematology & Oncology

## 2018-10-27 VITALS — BP 104/52 | HR 80 | Temp 98.3°F | Resp 16 | Wt 117.0 lb

## 2018-10-27 DIAGNOSIS — C50812 Malignant neoplasm of overlapping sites of left female breast: Secondary | ICD-10-CM

## 2018-10-27 DIAGNOSIS — C50919 Malignant neoplasm of unspecified site of unspecified female breast: Secondary | ICD-10-CM

## 2018-10-27 DIAGNOSIS — Z5112 Encounter for antineoplastic immunotherapy: Secondary | ICD-10-CM | POA: Diagnosis not present

## 2018-10-27 DIAGNOSIS — C787 Secondary malignant neoplasm of liver and intrahepatic bile duct: Secondary | ICD-10-CM | POA: Diagnosis not present

## 2018-10-27 DIAGNOSIS — C792 Secondary malignant neoplasm of skin: Secondary | ICD-10-CM

## 2018-10-27 DIAGNOSIS — D500 Iron deficiency anemia secondary to blood loss (chronic): Secondary | ICD-10-CM

## 2018-10-27 DIAGNOSIS — C50912 Malignant neoplasm of unspecified site of left female breast: Secondary | ICD-10-CM | POA: Diagnosis not present

## 2018-10-27 DIAGNOSIS — Z171 Estrogen receptor negative status [ER-]: Secondary | ICD-10-CM | POA: Diagnosis not present

## 2018-10-27 DIAGNOSIS — C773 Secondary and unspecified malignant neoplasm of axilla and upper limb lymph nodes: Secondary | ICD-10-CM

## 2018-10-27 DIAGNOSIS — L989 Disorder of the skin and subcutaneous tissue, unspecified: Secondary | ICD-10-CM

## 2018-10-27 DIAGNOSIS — Z79899 Other long term (current) drug therapy: Secondary | ICD-10-CM

## 2018-10-27 LAB — CBC WITH DIFFERENTIAL (CANCER CENTER ONLY)
Abs Immature Granulocytes: 0.01 10*3/uL (ref 0.00–0.07)
Basophils Absolute: 0 10*3/uL (ref 0.0–0.1)
Basophils Relative: 0 %
Eosinophils Absolute: 0.1 10*3/uL (ref 0.0–0.5)
Eosinophils Relative: 3 %
HCT: 29.7 % — ABNORMAL LOW (ref 36.0–46.0)
Hemoglobin: 9.2 g/dL — ABNORMAL LOW (ref 12.0–15.0)
Immature Granulocytes: 0 %
Lymphocytes Relative: 28 %
Lymphs Abs: 1 10*3/uL (ref 0.7–4.0)
MCH: 29.5 pg (ref 26.0–34.0)
MCHC: 31 g/dL (ref 30.0–36.0)
MCV: 95.2 fL (ref 80.0–100.0)
Monocytes Absolute: 0.4 10*3/uL (ref 0.1–1.0)
Monocytes Relative: 12 %
Neutro Abs: 2.1 10*3/uL (ref 1.7–7.7)
Neutrophils Relative %: 57 %
Platelet Count: 233 10*3/uL (ref 150–400)
RBC: 3.12 MIL/uL — ABNORMAL LOW (ref 3.87–5.11)
RDW: 15 % (ref 11.5–15.5)
WBC Count: 3.7 10*3/uL — ABNORMAL LOW (ref 4.0–10.5)
nRBC: 0 % (ref 0.0–0.2)

## 2018-10-27 LAB — CMP (CANCER CENTER ONLY)
ALT: 8 U/L (ref 0–44)
AST: 15 U/L (ref 15–41)
Albumin: 3.9 g/dL (ref 3.5–5.0)
Alkaline Phosphatase: 59 U/L (ref 38–126)
Anion gap: 4 — ABNORMAL LOW (ref 5–15)
BUN: 12 mg/dL (ref 6–20)
CO2: 27 mmol/L (ref 22–32)
Calcium: 8.6 mg/dL — ABNORMAL LOW (ref 8.9–10.3)
Chloride: 109 mmol/L (ref 98–111)
Creatinine: 1 mg/dL (ref 0.44–1.00)
GFR, Est AFR Am: >60 mL/min (ref >60)
GFR, Estimated: >60 mL/min (ref >60)
Glucose, Bld: 78 mg/dL (ref 70–99)
Potassium: 3.9 mmol/L (ref 3.5–5.1)
Sodium: 140 mmol/L (ref 135–145)
Total Bilirubin: 0.3 mg/dL (ref 0.3–1.2)
Total Protein: 5.9 g/dL — ABNORMAL LOW (ref 6.5–8.1)

## 2018-10-27 MED ORDER — SODIUM CHLORIDE 0.9 % IV SOLN
420.0000 mg | Freq: Once | INTRAVENOUS | Status: AC
  Administered 2018-10-27: 420 mg via INTRAVENOUS
  Filled 2018-10-27: qty 14

## 2018-10-27 MED ORDER — TRASTUZUMAB CHEMO 150 MG IV SOLR
300.0000 mg | Freq: Once | INTRAVENOUS | Status: AC
  Administered 2018-10-27: 300 mg via INTRAVENOUS
  Filled 2018-10-27: qty 14.29

## 2018-10-27 MED ORDER — ACETAMINOPHEN 325 MG PO TABS: 650.0000 mg | ORAL_TABLET | Freq: Once | ORAL | Status: DC

## 2018-10-27 MED ORDER — SODIUM CHLORIDE 0.9% FLUSH
10.0000 mL | INTRAVENOUS | Status: DC | PRN
  Administered 2018-10-27: 10 mL
  Filled 2018-10-27: qty 10

## 2018-10-27 MED ORDER — HEPARIN SOD (PORK) LOCK FLUSH 100 UNIT/ML IV SOLN
500.0000 [IU] | Freq: Once | INTRAVENOUS | Status: AC | PRN
  Administered 2018-10-27: 500 [IU]
  Filled 2018-10-27: qty 5

## 2018-10-27 MED ORDER — DIPHENHYDRAMINE HCL 25 MG PO CAPS: 50.0000 mg | ORAL_CAPSULE | Freq: Once | ORAL | Status: DC

## 2018-10-27 MED ORDER — SODIUM CHLORIDE 0.9 % IV SOLN
Freq: Once | INTRAVENOUS | Status: AC
  Administered 2018-10-27: 11:00:00 via INTRAVENOUS
  Filled 2018-10-27: qty 250

## 2018-10-27 NOTE — Progress Notes (Signed)
Hematology and Oncology Follow Up Visit  Taylor Flynn [id number] 1976-04-02 42 y.o. 10/27/2018   Principle Diagnosis:   Metastatic breast cancer-ER-/HER-2+  Current Therapy:    Herceptin/Perjeta --previous cycles given in [place].  Zometa 4 mg IV q. 2 months -next dose in January 2020     Interim History:  Taylor Flynn is back for her second office visit.  We first saw her about 3 weeks ago.  She had moved to [facility].  She wanted to transfer her care to the [place].  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  She also incorporates alternative therapies with the Perjeta/Herceptin.  She has a subcutaneous nodule in the left upper abdominal wall that we are not using as a measurement for response.  She is having some lower abdominal discomfort.  Her oncologist in [place] wanted to do a vaginal ultrasound.  This has not been done yet.  We will try to set this up at the imaging facility in our building.  She has had no problems with bowels or bladder.  She is had no bleeding.  She has had no leg swelling.  When we first saw her, her CA 27.29 was only 21.  Her appetite is good.  There is no cough or shortness of breath.  Overall, her performance status is ECOG 1.    Medications:  Current Outpatient Medications:  .  bicalutamide (CASODEX) 50 MG tablet, , Disp: , Rfl:  .  Cholecalciferol (VITAMIN D3) 5000 units TABS, Take by mouth daily., Disp: , Rfl:  .  cyclophosphamide (CYTOXAN) 50 MG capsule, Take 1 capsule by mouth daily., Disp: , Rfl:  .  ELTA SILVERGEL EX, Apply topically 3 (three) times daily., Disp: , Rfl:  .  EPINEPHrine 0.3 mg/0.3 mL IJ SOAJ injection, INJECT 0.3ML  INTRAMUSCULARLY ONCE FOR 1 DOSE AS DIRECTED, Disp: , Rfl: 0 .  EUROPEAN MISTLETOE [place], Inject into the skin 3 (three) times a week., Disp: , Rfl:  .  fexofenadine (ALLEGRA) 180 MG tablet, Take 1 tablet  (180 mg total) by mouth daily., Disp: 31 tablet, Rfl: 5 .  GINSENG PO, Take 2 capsules by mouth 2 (two) times daily., Disp: , Rfl:  .  ivermectin (STROMECTOL) 3 MG TABS tablet, TAKE 2 TABLETS BY MOUTH BID FOR 2 WEEKS THEN 2 TABLETS EVERY DAY THEREAFTER, Disp: , Rfl: 0 .  MELATONIN PO, Take 20 mg by mouth at bedtime., Disp: , Rfl:  .  Misc Natural Products (DANDELION ROOT PO), Take 3 capsules by mouth daily., Disp: , Rfl:  .  NALTREXONE HCL PO, Take 4.5 mg by mouth at bedtime., Disp: , Rfl:  .  Nutritional Supplements (NUTRITIONAL SUPPLEMENT PO), Take by mouth. Haelen (fermented soy) 1 oz.- BID Chinese Herbs 4 oz- BID Alcide Goodness- 1 capsule - QHS DesBio Leaky Gut Formula- 10 drops- TID Bravo Europe (Colostrum)- 1 capsule- QHS Graviola extract- 40 drops- TID Hydrate II - 10 drops -TID Immunis - 3 drops- TID Metatrol Pro ( Fermented wheat grem extract)- 2 capsules- daily Biohackit ( FenbenCur)- 4 capsules- 3 nights per week Lymph-Tone II - 1 dropper- BID Salvestrol- 4 capsules- every morning BrocElite- 2 capsules- TID, Disp: , Rfl:  .  Pertuzumab (PERJETA IV), Inject into the vein every 21 ( twenty-one) days. Combined with Herceptin and Zometa, Disp: , Rfl:  .  Trastuzumab (HERCEPTIN IV), Inject into the vein every 21 (  twenty-one) days. Combined with Perjeta and Zometa, Disp: , Rfl:  .  UNABLE TO FIND, 60 mg 3 (three) times daily. Tetrathiomolybdate, Disp: , Rfl:  .  Zoledronic Acid (ZOMETA IV), Inject into the vein every 21 ( twenty-one) days. Combined with Herceptin and Perjeta, Disp: , Rfl:   Allergies:  Allergies  Allergen Reactions  . Sulfa Antibiotics Rash    Past Medical History, Surgical history, Social history, and Family History were reviewed and updated.  Review of Systems: Review of Systems  Constitutional: Negative.   HENT:  Negative.   Eyes: Negative.   Respiratory: Negative.   Cardiovascular: Negative.   Gastrointestinal: Positive for abdominal pain.  Endocrine: Negative.     Genitourinary: Negative.    Musculoskeletal: Negative.   Skin: Negative.   Neurological: Negative.   Hematological: Negative.   Psychiatric/Behavioral: Negative.     Physical Exam:  weight is 117 lb (53.1 kg). Her oral temperature is 98.3 F (36.8 C). Her blood pressure is 104/52 (abnormal) and her pulse is 80. Her respiration is 16 and oxygen saturation is 100%.   Wt Readings from Last 3 Encounters:  10/27/18 117 lb (53.1 kg)  10/05/18 113 lb (51.3 kg)  08/18/18 115 lb 9.6 oz (52.4 kg)    Physical Exam Vitals signs reviewed.  HENT:     Head: Normocephalic and atraumatic.  Eyes:     Pupils: Pupils are equal, round, and reactive to light.  Neck:     Musculoskeletal: Normal range of motion.  Cardiovascular:     Rate and Rhythm: Normal rate and regular rhythm.     Heart sounds: Normal heart sounds.  Pulmonary:     Effort: Pulmonary effort is normal.     Breath sounds: Normal breath sounds.  Abdominal:     General: Bowel sounds are normal.     Palpations: Abdomen is soft.  Musculoskeletal: Normal range of motion.        General: No tenderness or deformity.  Lymphadenopathy:     Cervical: No cervical adenopathy.  Skin:    General: Skin is warm and dry.     Findings: No erythema or rash.     Comments: On the upper left quadrant of her abdomen, there is a subcutaneous nodule that measures about 3-4 mm in length.  It is nontender and non-mobile.  Neurological:     Mental Status: She is alert and oriented to person, place, and time.  Psychiatric:        Behavior: Behavior normal.        Thought Content: Thought content normal.        Judgment: Judgment normal.      Lab Results  Component Value Date   WBC 3.7 (L) 10/27/2018   HGB 9.2 (L) 10/27/2018   HCT 29.7 (L) 10/27/2018   MCV 95.2 10/27/2018   PLT 233 10/27/2018     Chemistry      Component Value Date/Time   NA 140 10/27/2018 0945   K 3.9 10/27/2018 0945   CL 109 10/27/2018 0945   CO2 27 10/27/2018 0945    BUN 12 10/27/2018 0945   CREATININE 1.00 10/27/2018 0945      Component Value Date/Time   CALCIUM 8.6 (L) 10/27/2018 0945   ALKPHOS 59 10/27/2018 0945   AST 15 10/27/2018 0945   ALT 8 10/27/2018 0945   BILITOT 0.3 10/27/2018 0945       Impression and Plan: Ms. Daggs is a 42 year old African-American female.  She has metastatic breast cancer.  She has moved to [place].  She will now continue her therapy in our clinic.  We will continue her on the Perjeta/Herceptin.  Again, she is a huge believer in complementary therapy with naturopathic agents.  She really wants to avoid chemotherapy.  As far as restaging her, her last scans were done in October.  There is no obvious metastatic disease that we can see.  Again she has this nodule in the left upper abdominal area.  I will plan to get her back in 3 weeks for her next cycle of Perjeta/Herceptin.  Her last echocardiogram was done in July.  We probably need to get one set up for her in January.  The ejection fraction was 65%.  We will see about the vaginal ultrasound.   [name], MD 12/24/201911:09 AM

## 2018-10-27 NOTE — Patient Instructions (Signed)
[facility] Discharge Instructions for Patients Receiving Chemotherapy  Today you received the following chemotherapy agents Herceptin and Perjeta.   To help prevent nausea and vomiting after your treatment, we encourage you to take your nausea medication as directed.   If you develop nausea and vomiting that is not controlled by your nausea medication, call the clinic.   BELOW ARE SYMPTOMS THAT SHOULD BE REPORTED IMMEDIATELY:  *FEVER GREATER THAN 100.5 F  *CHILLS WITH OR WITHOUT FEVER  NAUSEA AND VOMITING THAT IS NOT CONTROLLED WITH YOUR NAUSEA MEDICATION  *UNUSUAL SHORTNESS OF BREATH  *UNUSUAL BRUISING OR BLEEDING  TENDERNESS IN MOUTH AND THROAT WITH OR WITHOUT PRESENCE OF ULCERS  *URINARY PROBLEMS  *BOWEL PROBLEMS  UNUSUAL RASH Items with * indicate a potential emergency and should be followed up as soon as possible.  Feel free to call the clinic should you have any questions or concerns. The clinic phone number is [phone].  Please show the CHEMO ALERT CARD at check-in to the Emergency Department and triage nurse.   

## 2018-10-28 LAB — CANCER ANTIGEN 27.29: CA 27.29: 16.2 U/mL (ref 0.0–38.6)

## 2018-10-29 ENCOUNTER — Other Ambulatory Visit: Payer: Self-pay | Admitting: Family

## 2018-10-29 DIAGNOSIS — Z171 Estrogen receptor negative status [ER-]: Secondary | ICD-10-CM

## 2018-10-29 DIAGNOSIS — C50812 Malignant neoplasm of overlapping sites of left female breast: Secondary | ICD-10-CM

## 2018-10-29 DIAGNOSIS — R102 Pelvic and perineal pain: Secondary | ICD-10-CM

## 2018-11-17 ENCOUNTER — Inpatient Hospital Stay

## 2018-11-17 ENCOUNTER — Other Ambulatory Visit: Payer: Self-pay | Admitting: Family

## 2018-11-17 ENCOUNTER — Inpatient Hospital Stay: Attending: Hematology & Oncology | Admitting: Family

## 2018-11-17 ENCOUNTER — Encounter: Payer: Self-pay | Admitting: Family

## 2018-11-17 ENCOUNTER — Telehealth: Payer: Self-pay | Admitting: Family

## 2018-11-17 VITALS — BP 121/74 | HR 74 | Temp 98.1°F | Resp 16 | Wt 117.8 lb

## 2018-11-17 DIAGNOSIS — Z5112 Encounter for antineoplastic immunotherapy: Secondary | ICD-10-CM | POA: Diagnosis present

## 2018-11-17 DIAGNOSIS — Z171 Estrogen receptor negative status [ER-]: Secondary | ICD-10-CM

## 2018-11-17 DIAGNOSIS — C792 Secondary malignant neoplasm of skin: Secondary | ICD-10-CM

## 2018-11-17 DIAGNOSIS — D500 Iron deficiency anemia secondary to blood loss (chronic): Secondary | ICD-10-CM

## 2018-11-17 DIAGNOSIS — C50919 Malignant neoplasm of unspecified site of unspecified female breast: Secondary | ICD-10-CM

## 2018-11-17 DIAGNOSIS — C171 Malignant neoplasm of jejunum: Secondary | ICD-10-CM

## 2018-11-17 DIAGNOSIS — R102 Pelvic and perineal pain: Secondary | ICD-10-CM

## 2018-11-17 DIAGNOSIS — C787 Secondary malignant neoplasm of liver and intrahepatic bile duct: Secondary | ICD-10-CM

## 2018-11-17 DIAGNOSIS — C50912 Malignant neoplasm of unspecified site of left female breast: Secondary | ICD-10-CM | POA: Insufficient documentation

## 2018-11-17 DIAGNOSIS — R222 Localized swelling, mass and lump, trunk: Secondary | ICD-10-CM | POA: Diagnosis not present

## 2018-11-17 DIAGNOSIS — C50812 Malignant neoplasm of overlapping sites of left female breast: Secondary | ICD-10-CM

## 2018-11-17 DIAGNOSIS — C773 Secondary and unspecified malignant neoplasm of axilla and upper limb lymph nodes: Secondary | ICD-10-CM | POA: Diagnosis not present

## 2018-11-17 DIAGNOSIS — R140 Abdominal distension (gaseous): Secondary | ICD-10-CM | POA: Insufficient documentation

## 2018-11-17 LAB — CBC WITH DIFFERENTIAL (CANCER CENTER ONLY)
Abs Immature Granulocytes: 0.01 10*3/uL (ref 0.00–0.07)
Basophils Absolute: 0 10*3/uL (ref 0.0–0.1)
Basophils Relative: 1 %
Eosinophils Absolute: 0.2 10*3/uL (ref 0.0–0.5)
Eosinophils Relative: 4 %
HCT: 33.3 % — ABNORMAL LOW (ref 36.0–46.0)
Hemoglobin: 10.4 g/dL — ABNORMAL LOW (ref 12.0–15.0)
Immature Granulocytes: 0 %
Lymphocytes Relative: 26 %
Lymphs Abs: 1.1 10*3/uL (ref 0.7–4.0)
MCH: 29.5 pg (ref 26.0–34.0)
MCHC: 31.2 g/dL (ref 30.0–36.0)
MCV: 94.6 fL (ref 80.0–100.0)
Monocytes Absolute: 0.4 10*3/uL (ref 0.1–1.0)
Monocytes Relative: 9 %
Neutro Abs: 2.6 10*3/uL (ref 1.7–7.7)
Neutrophils Relative %: 60 %
Platelet Count: 257 10*3/uL (ref 150–400)
RBC: 3.52 MIL/uL — ABNORMAL LOW (ref 3.87–5.11)
RDW: 14.1 % (ref 11.5–15.5)
WBC Count: 4.2 10*3/uL (ref 4.0–10.5)
nRBC: 0 % (ref 0.0–0.2)

## 2018-11-17 LAB — IRON AND TIBC
Iron: 16 ug/dL — ABNORMAL LOW (ref 41–142)
Saturation Ratios: 4 % — ABNORMAL LOW (ref 21–57)
TIBC: 403 ug/dL (ref 236–444)
UIBC: 386 ug/dL — ABNORMAL HIGH (ref 120–384)

## 2018-11-17 LAB — CMP (CANCER CENTER ONLY)
ALT: 8 U/L (ref 0–44)
AST: 16 U/L (ref 15–41)
Albumin: 4.3 g/dL (ref 3.5–5.0)
Alkaline Phosphatase: 49 U/L (ref 38–126)
Anion gap: 7 (ref 5–15)
BUN: 12 mg/dL (ref 6–20)
CO2: 28 mmol/L (ref 22–32)
Calcium: 8.9 mg/dL (ref 8.9–10.3)
Chloride: 104 mmol/L (ref 98–111)
Creatinine: 1.1 mg/dL — ABNORMAL HIGH (ref 0.44–1.00)
GFR, Est AFR Am: >60 mL/min (ref >60)
GFR, Estimated: >60 mL/min (ref >60)
Glucose, Bld: 84 mg/dL (ref 70–99)
Potassium: 4 mmol/L (ref 3.5–5.1)
Sodium: 139 mmol/L (ref 135–145)
Total Bilirubin: 0.3 mg/dL (ref 0.3–1.2)
Total Protein: 6.9 g/dL (ref 6.5–8.1)

## 2018-11-17 LAB — FERRITIN: Ferritin: <4 ng/mL — ABNORMAL LOW (ref 11–307)

## 2018-11-17 LAB — RETICULOCYTES
Immature Retic Fract: 8.8 % (ref 2.3–15.9)
RBC.: 3.52 MIL/uL — ABNORMAL LOW (ref 3.87–5.11)
Retic Count, Absolute: 29.2 10*3/uL (ref 19.0–186.0)
Retic Ct Pct: 0.8 % (ref 0.4–3.1)

## 2018-11-17 LAB — LACTATE DEHYDROGENASE: LDH: 165 U/L (ref 98–192)

## 2018-11-17 MED ORDER — TRASTUZUMAB CHEMO 150 MG IV SOLR
300.0000 mg | Freq: Once | INTRAVENOUS | Status: AC
  Administered 2018-11-17: 300 mg via INTRAVENOUS
  Filled 2018-11-17: qty 14.29

## 2018-11-17 MED ORDER — SODIUM CHLORIDE 0.9 % IV SOLN
420.0000 mg | Freq: Once | INTRAVENOUS | Status: AC
  Administered 2018-11-17: 420 mg via INTRAVENOUS
  Filled 2018-11-17: qty 14

## 2018-11-17 MED ORDER — SODIUM CHLORIDE 0.9 % IV SOLN
Freq: Once | INTRAVENOUS | Status: AC
  Administered 2018-11-17: 11:00:00 via INTRAVENOUS
  Filled 2018-11-17: qty 250

## 2018-11-17 MED ORDER — SODIUM CHLORIDE 0.9% FLUSH
10.0000 mL | INTRAVENOUS | Status: DC | PRN
  Administered 2018-11-17: 10 mL
  Filled 2018-11-17: qty 10

## 2018-11-17 MED ORDER — HEPARIN SOD (PORK) LOCK FLUSH 100 UNIT/ML IV SOLN
500.0000 [IU] | Freq: Once | INTRAVENOUS | Status: AC | PRN
  Administered 2018-11-17: 500 [IU]
  Filled 2018-11-17: qty 5

## 2018-11-17 MED ORDER — TRASTUZUMAB CHEMO 150 MG IV SOLR: 6.0000 mg/kg | Freq: Once | INTRAVENOUS | Status: DC

## 2018-11-17 MED ORDER — ZOLEDRONIC ACID 4 MG/100ML IV SOLN
4.0000 mg | Freq: Once | INTRAVENOUS | Status: AC
  Administered 2018-11-17: 4 mg via INTRAVENOUS
  Filled 2018-11-17: qty 100

## 2018-11-17 NOTE — Patient Instructions (Signed)
[facility] Discharge Instructions for Patients Receiving Chemotherapy  Today you received the following chemotherapy agents Herceptin Perjeta  To help prevent nausea and vomiting after your treatment, we encourage you to take your nausea medication    If you develop nausea and vomiting that is not controlled by your nausea medication, call the clinic.   BELOW ARE SYMPTOMS THAT SHOULD BE REPORTED IMMEDIATELY:  *FEVER GREATER THAN 100.5 F  *CHILLS WITH OR WITHOUT FEVER  NAUSEA AND VOMITING THAT IS NOT CONTROLLED WITH YOUR NAUSEA MEDICATION  *UNUSUAL SHORTNESS OF BREATH  *UNUSUAL BRUISING OR BLEEDING  TENDERNESS IN MOUTH AND THROAT WITH OR WITHOUT PRESENCE OF ULCERS  *URINARY PROBLEMS  *BOWEL PROBLEMS  UNUSUAL RASH Items with * indicate a potential emergency and should be followed up as soon as possible.  Feel free to call the clinic should you have any questions or concerns. The clinic phone number is [phone].  Please show the [place] at check-in to the Emergency Department and triage nurse.

## 2018-11-17 NOTE — Telephone Encounter (Signed)
Appointments scheduled/ US/ECHO patient was given phone # to call to r/s her ECHO appointment [phone] per 1/14 los

## 2018-11-17 NOTE — Progress Notes (Signed)
Hematology and Oncology Follow Up Visit  Taylor Flynn [id number] 1976/08/31 43 y.o. 11/17/2018   Principle Diagnosis:  Metastatic breast cancer-ER-/HER-2+  Current Therapy:   Herceptin/Perjeta - previous cycles given in [place] Zometa 4 mg IV q 2 months due again in March   Interim History:  Taylor Flynn is here today for follow-up. She is doing well but has intermittent fatigue and mild headaches she feels may be due to the Casodex. She has also noted that she is urinating more frequently but denies pain or odor.   She has had some abdominal bloating and discomfort at times. She denies any pain at this time. She will be having an [place] of pelvis complete with transvaginal this week on Thursday.  She is taking a probiotic and seeing an holistic doctor.  Her cycles is irregular and occurs every 21 days or so.  Breast exam today showed no changes with right or left breast.  CA 27.29 in December was 16.2.  No lymphadenopathy noted on exam. She has a subcutaneous nodule on the left upper quadrant of her abdomen measuring 1/2 in x 1/2 in today. She feels that this has gotten larger.  No fever, chills, n/v, cough, rash, dizziness, SOB, chest pain, palpitations or changes in bowel or bladder habits.  No swelling, tenderness, numbness or tingling in her extremities.   She is primarily Vegan but does eat some wild salmon from time to time. She has maintained a good appetite and is staying well hydrated. Her weight is stable.  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   ECOG Performance Status: 1 - Symptomatic but completely ambulatory  Medications:  Allergies as of 11/17/2018      Reactions   Sulfa Antibiotics Rash      Medication List       Accurate as of November 17, 2018  3:19 PM. Always use your most recent med list.        bicalutamide 50 MG tablet Commonly known as:  CASODEX 3 tabs every morning   cyclophosphamide 50 MG capsule Commonly known as:   CYTOXAN Take 1 capsule by mouth daily.   ELTA SILVERGEL EX Apply topically 3 (three) times daily.   EPINEPHrine 0.3 mg/0.3 mL Soaj injection Commonly known as:  EPI-PEN INJECT 0.3ML  INTRAMUSCULARLY ONCE FOR 1 DOSE AS DIRECTED   EUROPEAN MISTLETOE [place] Inject 100 mg into the skin 3 (three) times a week.   HERCEPTIN IV Inject into the vein every 21 ( twenty-one) days. Combined with Perjeta and Zometa   ivermectin 3 MG Tabs tablet Commonly known as:  STROMECTOL TAKE 2 TABLETS BY MOUTH BID FOR 2 WEEKS THEN 2 TABLETS EVERY DAY THEREAFTER   methotrexate 2.5 MG tablet Commonly known as:  RHEUMATREX Take 2.5 mg by mouth 2 (two) times a week. Caution:Chemotherapy. Protect from light.   NALTREXONE HCL PO Take 4.5 mg by mouth at bedtime. Taking it Wed through Sat only   NUTRITIONAL SUPPLEMENT PO Take by mouth. Haelen (fermented soy) 1 oz.- BID  Alcide Goodness- 1 capsule - QHS Vit U2 15 drops TID CoQ10 1 dropperful BID with Haelen Haelen 2 Tbsp BID  Medicinal Mushrooms BID and [place] Tail 1/2 tsp BID    Graviola extract- 40 drops- TID Hydrate II - 10 drops -TID Immunis - 3 drops- TID  Biohackit ( FenbenCur)- 4 capsules- 3 nights per week Lymph-Tone II - 1 dropper- BID Salvestrol- 4 capsules- every morning and 2-3 caps about 4  hours later  BrocElite- 2 capsules- 1-3 caps daily   PERJETA IV Inject into the vein every 21 ( twenty-one) days. Combined with Herceptin and Zometa   UNABLE TO FIND 60 mg 3 (three) times daily. Tetrathiomolybdate   Vitamin D3 125 MCG (5000 UT) Tabs Take by mouth daily.   ZOMETA IV Inject into the vein every 21 ( twenty-one) days. Combined with Herceptin and Perjeta       Allergies:  Allergies  Allergen Reactions  . Sulfa Antibiotics Rash    Past Medical History, Surgical history, Social history, and Family History were reviewed and updated.  Review of Systems: All other 10 point review of systems is negative.   Physical Exam:  weight is  117 lb 12 oz (53.4 kg). Her oral temperature is 98.1 F (36.7 C). Her blood pressure is 121/74 and her pulse is 74. Her respiration is 16 and oxygen saturation is 100%.   Wt Readings from Last 3 Encounters:  11/17/18 117 lb 12 oz (53.4 kg)  10/27/18 117 lb (53.1 kg)  10/05/18 113 lb (51.3 kg)    Ocular: Sclerae unicteric, pupils equal, round and reactive to light Ear-nose-throat: Oropharynx clear, dentition fair Lymphatic: No cervical, supraclavicular or axillary adenopathy Lungs no rales or rhonchi, good excursion bilaterally Heart regular rate and rhythm, no murmur appreciated Abd soft, nontender, positive bowel sounds, no liver or spleen tip palpated on exam, no fluid wave, subcutaneous nodule left upper abdomen   MSK no focal spinal tenderness, no joint edema Neuro: non-focal, well-oriented, appropriate affect Breasts: No changes with right or left breast.   Lab Results  Component Value Date   WBC 4.2 11/17/2018   HGB 10.4 (L) 11/17/2018   HCT 33.3 (L) 11/17/2018   MCV 94.6 11/17/2018   PLT 257 11/17/2018   Lab Results  Component Value Date   FERRITIN <4 (L) 11/17/2018   IRON 16 (L) 11/17/2018   TIBC 403 11/17/2018   UIBC 386 (H) 11/17/2018   IRONPCTSAT 4 (L) 11/17/2018   Lab Results  Component Value Date   RETICCTPCT 0.8 11/17/2018   RBC 3.52 (L) 11/17/2018   RBC 3.52 (L) 11/17/2018   No results found for: KPAFRELGTCHN, LAMBDASER, KAPLAMBRATIO No results found for: IGGSERUM, IGA, IGMSERUM No results found for: Odetta Pink, SPEI   Chemistry      Component Value Date/Time   NA 139 11/17/2018 0909   K 4.0 11/17/2018 0909   CL 104 11/17/2018 0909   CO2 28 11/17/2018 0909   BUN 12 11/17/2018 0909   CREATININE 1.10 (H) 11/17/2018 0909      Component Value Date/Time   CALCIUM 8.9 11/17/2018 0909   ALKPHOS 49 11/17/2018 0909   AST 16 11/17/2018 0909   ALT 8 11/17/2018 0909   BILITOT 0.3 11/17/2018 0909         Impression and Plan: Taylor Flynn is a very pleasant 43 yo African American female with metastatic breast cancer. She continues to tolerate Herceptin and Perjeta well.  We will proceed with treatment today as planned.  Is spoke with Dr. Marin Olp regarding the subcutaneous nodule on her left upper abdomen and would like to have this resected and sent for molecular testing. Referral placed with Dr. Dalbert Batman.   She will be having her US of the pelvis complete with transvaginal this week on Thursday to further evaluate her bloating and abdominal discomfort.  We will also get her schedule for an ECHO this month.  She wants  to hold off on IV iron at this time and try changing her diet and adding a supplement.  She is researching prosthetics for mastectomy bras and will let us know which type she prefers to order.  She has her current treatment schedule with follow-up in 3 weeks.  She is in agreement with the plan and will contact our office with any questions or concerns. We can certainly see her sooner if need be.   [name], NP 1/14/20203:19 PM

## 2018-11-18 LAB — CANCER ANTIGEN 27.29: CA 27.29: 19.7 U/mL (ref 0.0–38.6)

## 2018-11-19 ENCOUNTER — Encounter (HOSPITAL_BASED_OUTPATIENT_CLINIC_OR_DEPARTMENT_OTHER): Payer: Self-pay

## 2018-11-19 ENCOUNTER — Telehealth: Payer: Self-pay | Admitting: Family

## 2018-11-19 ENCOUNTER — Inpatient Hospital Stay

## 2018-11-19 ENCOUNTER — Ambulatory Visit (HOSPITAL_BASED_OUTPATIENT_CLINIC_OR_DEPARTMENT_OTHER)
Admission: RE | Admit: 2018-11-19 | Discharge: 2018-11-19 | Disposition: A | Discharge status: Home / Self Care | Source: Ambulatory Visit | Attending: Family | Admitting: Family

## 2018-11-19 DIAGNOSIS — R102 Pelvic and perineal pain: Secondary | ICD-10-CM | POA: Insufficient documentation

## 2018-11-19 DIAGNOSIS — Z171 Estrogen receptor negative status [ER-]: Secondary | ICD-10-CM | POA: Diagnosis present

## 2018-11-19 DIAGNOSIS — C50812 Malignant neoplasm of overlapping sites of left female breast: Secondary | ICD-10-CM | POA: Insufficient documentation

## 2018-11-19 NOTE — Telephone Encounter (Signed)
Spoke with patient regarding appointment @ [facility] w/ Dr Dalbert Batman also ECHO was r/s and Iron infusion was cancelled per patient request.  Burman Freestone ok's this to be cancelled

## 2018-11-20 ENCOUNTER — Other Ambulatory Visit (HOSPITAL_BASED_OUTPATIENT_CLINIC_OR_DEPARTMENT_OTHER)

## 2018-11-20 ENCOUNTER — Other Ambulatory Visit: Payer: Self-pay | Admitting: Family

## 2018-11-20 ENCOUNTER — Other Ambulatory Visit: Payer: Self-pay

## 2018-11-20 ENCOUNTER — Telehealth: Payer: Self-pay | Admitting: Family

## 2018-11-20 ENCOUNTER — Other Ambulatory Visit: Payer: Self-pay | Admitting: General Surgery

## 2018-11-20 ENCOUNTER — Telehealth: Payer: Self-pay | Admitting: *Deleted

## 2018-11-20 ENCOUNTER — Encounter (HOSPITAL_COMMUNITY): Payer: Self-pay | Admitting: *Deleted

## 2018-11-20 DIAGNOSIS — C50912 Malignant neoplasm of unspecified site of left female breast: Secondary | ICD-10-CM

## 2018-11-20 DIAGNOSIS — C787 Secondary malignant neoplasm of liver and intrahepatic bile duct: Secondary | ICD-10-CM

## 2018-11-20 DIAGNOSIS — C792 Secondary malignant neoplasm of skin: Secondary | ICD-10-CM

## 2018-11-20 DIAGNOSIS — R9389 Abnormal findings on diagnostic imaging of other specified body structures: Secondary | ICD-10-CM

## 2018-11-20 DIAGNOSIS — C50919 Malignant neoplasm of unspecified site of unspecified female breast: Secondary | ICD-10-CM

## 2018-11-20 NOTE — Telephone Encounter (Signed)
Taylor Flynn had emailed with some questions. I was able to call and speak directly with her. She will be having surgery with Dr. Dalbert Batman on Tuesday next week (11/24/2018). We will be able to better determine treatment options once we have her pathology report. She verbalized understanding. She also sees an integrative medicine doctor who suggested a protocol with propranolol and tramadol before, during and after surgery to help prevent stem cell leakage with surgery. She will fax [place] this protocol and Dr. Marin Olp will evaluate.  We also went over her [place] report and have referred her to Dr. Ammie Ferrier for further evaluation. She is in agreement with the plan.

## 2018-11-20 NOTE — Telephone Encounter (Signed)
Received patient's requested surgical protocol from her holistic physician Taylor Flynn. The protocol is as follows:  Propranolol 40mg  starting one week before surgery and 80mg  starting 2 days before surgery continuing 14 days after surgery. Toradol 30mg  IV q6h starting before the first incision, then 10mg  po q6hrs for a combined total of 5 days.   Taylor Peace NP and Taylor Taylor Flynn reviewed the protocol but due to the patient being scheduled for surgery on Tuesday, and this being a surgical protocol, this will need to be approved and prescribed by the surgeon's office. This office doesn't want to start patient on propranolol so close to a surgical date without knowing potential side effects, and any IV dosing and post op treatment with toradol will need to be approve by the attending physician at the surgical location.  Patient became upset when she was told we would not prescribe. She stated she was on a time crunch and her integrative health MD was out of state and could not prescribe in [place]. She stated she had tried to call Taylor Darrel Hoover office, but hadn't heard back yet. 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 Again, I asked her to follow up with the surgeon's office, and she sounded like she was upset and the phone call ended.   Taylor Peace NP notified of the above. Will also route to Taylor Dalbert Batman so that he is aware of patient request.

## 2018-11-22 NOTE — H&P (Signed)
Taylor Flynn Location: [facility] Patient #: [id number] DOB: 1976-03-25 Divorced / Language: English / Race: Black or African American Female       History of Present Illness  The patient is a 43 year old female who presents with breast cancer. This is a very pleasant 43 year old woman, referred by Dr. Burney Gauze for excision of left anterior chest wall nodule thought to be metastatic breast cancer.    In April 2017 she lived in [place], [place] and presented with a large ulcerated tumor of her left breast. She has not had breast surgery. Reportedly this is a HER-2 positive tumor. A port was placed on the right side and she received Herceptin and the tumor regressed and the skin healed. She states that she has liver lesions that have responded to chemotherapy. She says there was a left anterior chest wall nodule, never biopsy, that regressed. 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 She has now moved to [place] to be with her brother sister and mother who lives in [place] and is established with Dr. Marin Olp. Dr. Marin Olp continued her suppressive chemotherapy but noticed a nodule of the left anterior chest wall and reported that excised for histologic confirmation and breast diagnostic profile.     Comorbidities include the metastatic breast cancer. Narcolepsy. Urinary urgency. She had a pelvic ultrasound yesterday which was reportedly normal. Family history reveals a half sister had DCIS. 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 Doing locums work.. Used to live in [place]. Moved to [place]. Divorced. 2 children. Ages 75 and 26. Denies alcohol or tobacco. Lives with her brother.     We discussed and she agreed that we would proceed next week with complete excision of this area under monitored sedation. I described an  elliptical incision trying to get clean margins all around and she agrees with that. I discussed the indications, details, techniques, and risk of the surgery with her in detail. She is aware the risk of bleeding, infection, nerve damage with chronic pain, local recurrence. She understands all of these issues. All her questions were answered. She agrees with this plan.   Past Surgical History  Breast Biopsy  Left. Cesarean Section - 1  Sentinel Lymph Node Biopsy   Diagnostic Studies History Colonoscopy  never Mammogram  1-3 years ago Pap Smear  1-5 years ago  Allergies Sulfa Antibiotics  Allergies Reconciled   Medication History Methotrexate (2.5MG Tablet, Oral) Active. Bicalutamide (50MG Tablet, Oral) Active. Cyclophosphamide (50MG Capsule, Oral) Active. Cephalexin (250MG Capsule, Oral) Active. EPINEPHrine (0.3MG/0.3ML Soln Auto-inj, Injection) Active. Ivermectin (3MG Tablet, Oral) Active. predniSONE (20MG Tablet, Oral) Active. Medications Reconciled  Social History  Caffeine use  Tea. No alcohol use  No drug use  Tobacco use  Never smoker.  Family History Alcohol Abuse  Brother, Sister. Anesthetic complications  Mother. Arthritis  Father. Breast Cancer  Family Members In General, Sister. Cancer  Family Members In General. Cerebrovascular Accident  Family Members In General, Mother, Sister. Colon Polyps  Sister. Depression  Brother, Mother, Sister. Diabetes Mellitus  Family Members In General, Sister. Heart Disease  Family Members In General. Heart disease in female family member before age 29  Hypertension  Brother, Family Members In [place], Mother, Sister. Kidney Disease  Family Members In General. Prostate Cancer  Brother, Family Members In General. Respiratory Condition  Sister.  Pregnancy / Birth  History  Age at menarche  33 years. Contraceptive History  Oral contraceptives. Gravida  5 Length (months) of  breastfeeding  7-12 Maternal age  38-20 Para  2 Regular periods   Other Problems  Anxiety Disorder  Back Pain  Bladder Problems  Breast Cancer  Heart murmur  Inguinal Hernia  Other disease, cancer, significant illness     Review of Systems General Not Present- Appetite Loss, Chills, Fatigue, Fever, Night Sweats, Weight Gain and Weight Loss. Skin Present- New Lesions. Not Present- Change in Wart/Mole, Dryness, Hives, Jaundice, Non-Healing Wounds, Rash and Ulcer. HEENT Present- Wears glasses/contact lenses. Not Present- Earache, Hearing Loss, Hoarseness, Nose Bleed, Oral Ulcers, Ringing in the Ears, Seasonal Allergies, Sinus Pain, Sore Throat, Visual Disturbances and Yellow Eyes. Breast Present- Breast Mass. Not Present- Breast Pain, Nipple Discharge and Skin Changes. Cardiovascular Present- Rapid Heart Rate and Swelling of Extremities. Not Present- Chest Pain, Difficulty Breathing Lying Down, Leg Cramps, Palpitations and Shortness of Breath. Gastrointestinal Present- Abdominal Pain, Bloating, Excessive gas and Indigestion. Not Present- Bloody Stool, Change in Bowel Habits, Chronic diarrhea, Constipation, Difficulty Swallowing, Gets full quickly at meals, Hemorrhoids, Nausea, Rectal Pain and Vomiting. Female Genitourinary Present- Frequency, Nocturia, Pelvic Pain and Urgency. Not Present- Painful Urination. Musculoskeletal Present- Back Pain. Not Present- Joint Pain, Joint Stiffness, Muscle Pain, Muscle Weakness and Swelling of Extremities. Neurological Present- Headaches. Not Present- Decreased Memory, Fainting, Numbness, Seizures, Tingling, Tremor, Trouble walking and Weakness. Psychiatric Present- Anxiety and Fearful. Not Present- Bipolar, Change in Sleep Pattern, Depression and Frequent crying.  Vitals  Weight: 114 lb Height: 64in Body Surface Area: 1.54 m Body Mass Index: 19.57 kg/m  Temp.: 97.80F  Pulse: 80 (Regular)  BP: 106/72 (Sitting, Left Arm,  Standard)       Physical Exam  General Mental Status-Alert. General Appearance-Consistent with stated age. Hydration-Well hydrated. Voice-Normal. Note: Low body weight. BMI 19.   Head and Neck Head-normocephalic, atraumatic with no lesions or palpable masses. Trachea-midline. Thyroid Gland Characteristics - normal size and consistency.  Eye Eyeball - Bilateral-Extraocular movements intact. Sclera/Conjunctiva - Bilateral-No scleral icterus.  Chest and Lung Exam Chest and lung exam reveals -quiet, even and easy respiratory effort with no use of accessory muscles and on auscultation, normal breath sounds, no adventitious sounds and normal vocal resonance. Inspection Chest Wall - Normal. Back - normal. Note: Port-A-Cath in place right infraclavicular area looks fine   Breast Note: Right breast is tiny almost atrophic skin nodules or mass. There is a small palpable lymph node in the right axilla. Left breast shows lots of skin hypopigmentation presumably from resolution of her aggressive tumor but the skin is intact. There is 1 cm mobile nodule in the left anterior chest wall inferior and lateral to the breast itself. This is suspicious for metastatic breast cancer. Left axilla is clinically negative.   Cardiovascular Cardiovascular examination reveals -normal heart sounds, regular rate and rhythm with no murmurs and normal pedal pulses bilaterally.  Abdomen Inspection Inspection of the abdomen reveals - No Hernias. Skin - Scar - no surgical scars. Palpation/Percussion Palpation and Percussion of the abdomen reveal - Soft, Non Tender, No Rebound tenderness, No Rigidity (guarding) and No hepatosplenomegaly. Auscultation Auscultation of the abdomen reveals - Bowel sounds normal.  Neurologic Neurologic evaluation reveals -alert and oriented x 3 with no impairment of recent or remote memory. Mental Status-Normal.  Musculoskeletal Normal Exam -  Left-Upper Extremity Strength Normal and Lower Extremity Strength Normal. Normal Exam - Right-Upper Extremity Strength Normal and Lower Extremity Strength Normal.  Lymphatic Note: There was a small but palpable lymph node in the right axilla that is mobile. Significance unknown Left axilla is negative Cervical, supraclavicular areas reveal no adenopathy     Assessment & Plan  NODULE OF LEFT ANTERIOR CHEST WALL (R22.2) Current Plans Schedule for Surgery  The 1 cm nodule of your left anterior chest wall is suspicious for recurrent metastatic breast cancer Fortunately it is not fixed to the chest wall I recommend complete excision with elliptical incision as I described You have agreed to this  We will schedule the surgery as soon as possible next week I have discussed the indications, techniques, and risk of the surgery with him in detail  METASTATIC BREAST CANCER (C50.919) NARCOLEPSY (G47.419)    Edsel Petrin. Dalbert Batman, M.D., [facility] General and Minimally invasive Surgery Breast and Colorectal Surgery Office:   [phone] Pager:   [phone]

## 2018-11-23 ENCOUNTER — Other Ambulatory Visit (HOSPITAL_BASED_OUTPATIENT_CLINIC_OR_DEPARTMENT_OTHER)

## 2018-11-24 ENCOUNTER — Ambulatory Visit (HOSPITAL_COMMUNITY): Admitting: Anesthesiology

## 2018-11-24 ENCOUNTER — Encounter (HOSPITAL_COMMUNITY)
Admission: RE | Disposition: A | Discharge status: Home / Self Care | Payer: Self-pay | Source: Home / Self Care | Attending: General Surgery

## 2018-11-24 ENCOUNTER — Encounter (HOSPITAL_COMMUNITY): Payer: Self-pay | Admitting: *Deleted

## 2018-11-24 ENCOUNTER — Other Ambulatory Visit: Payer: Self-pay

## 2018-11-24 ENCOUNTER — Ambulatory Visit (HOSPITAL_COMMUNITY)
Admission: RE | Admit: 2018-11-24 | Discharge: 2018-11-24 | Disposition: A | Discharge status: Home / Self Care | Attending: General Surgery | Admitting: General Surgery

## 2018-11-24 DIAGNOSIS — Z79899 Other long term (current) drug therapy: Secondary | ICD-10-CM | POA: Insufficient documentation

## 2018-11-24 DIAGNOSIS — Z9221 Personal history of antineoplastic chemotherapy: Secondary | ICD-10-CM | POA: Diagnosis not present

## 2018-11-24 DIAGNOSIS — Z8042 Family history of malignant neoplasm of prostate: Secondary | ICD-10-CM | POA: Insufficient documentation

## 2018-11-24 DIAGNOSIS — Z803 Family history of malignant neoplasm of breast: Secondary | ICD-10-CM | POA: Insufficient documentation

## 2018-11-24 DIAGNOSIS — Z841 Family history of disorders of kidney and ureter: Secondary | ICD-10-CM | POA: Insufficient documentation

## 2018-11-24 DIAGNOSIS — Z8371 Family history of colonic polyps: Secondary | ICD-10-CM | POA: Insufficient documentation

## 2018-11-24 DIAGNOSIS — Z8249 Family history of ischemic heart disease and other diseases of the circulatory system: Secondary | ICD-10-CM | POA: Diagnosis not present

## 2018-11-24 DIAGNOSIS — Z8261 Family history of arthritis: Secondary | ICD-10-CM | POA: Insufficient documentation

## 2018-11-24 DIAGNOSIS — C50912 Malignant neoplasm of unspecified site of left female breast: Secondary | ICD-10-CM | POA: Diagnosis not present

## 2018-11-24 DIAGNOSIS — Z833 Family history of diabetes mellitus: Secondary | ICD-10-CM | POA: Diagnosis not present

## 2018-11-24 DIAGNOSIS — M549 Dorsalgia, unspecified: Secondary | ICD-10-CM | POA: Insufficient documentation

## 2018-11-24 DIAGNOSIS — C792 Secondary malignant neoplasm of skin: Secondary | ICD-10-CM | POA: Insufficient documentation

## 2018-11-24 DIAGNOSIS — G47419 Narcolepsy without cataplexy: Secondary | ICD-10-CM | POA: Insufficient documentation

## 2018-11-24 DIAGNOSIS — Z836 Family history of other diseases of the respiratory system: Secondary | ICD-10-CM | POA: Diagnosis not present

## 2018-11-24 DIAGNOSIS — Z171 Estrogen receptor negative status [ER-]: Secondary | ICD-10-CM | POA: Insufficient documentation

## 2018-11-24 DIAGNOSIS — C7951 Secondary malignant neoplasm of bone: Secondary | ICD-10-CM | POA: Diagnosis present

## 2018-11-24 DIAGNOSIS — R011 Cardiac murmur, unspecified: Secondary | ICD-10-CM | POA: Diagnosis not present

## 2018-11-24 DIAGNOSIS — F419 Anxiety disorder, unspecified: Secondary | ICD-10-CM | POA: Insufficient documentation

## 2018-11-24 DIAGNOSIS — Z818 Family history of other mental and behavioral disorders: Secondary | ICD-10-CM | POA: Diagnosis not present

## 2018-11-24 DIAGNOSIS — Z811 Family history of alcohol abuse and dependence: Secondary | ICD-10-CM | POA: Diagnosis not present

## 2018-11-24 DIAGNOSIS — Z882 Allergy status to sulfonamides status: Secondary | ICD-10-CM | POA: Insufficient documentation

## 2018-11-24 DIAGNOSIS — Z823 Family history of stroke: Secondary | ICD-10-CM | POA: Diagnosis not present

## 2018-11-24 DIAGNOSIS — R3915 Urgency of urination: Secondary | ICD-10-CM | POA: Insufficient documentation

## 2018-11-24 HISTORY — DX: Urge incontinence: N39.41

## 2018-11-24 HISTORY — PX: MASS EXCISION: SHX2000

## 2018-11-24 LAB — HCG, SERUM, QUALITATIVE: Preg, Serum: NEGATIVE

## 2018-11-24 SURGERY — EXCISION MASS
Anesthesia: Monitor Anesthesia Care | Laterality: Left

## 2018-11-24 MED ORDER — LIDOCAINE 2% (20 MG/ML) 5 ML SYRINGE
INTRAMUSCULAR | Status: DC | PRN
  Administered 2018-11-24: 25 mg via INTRAVENOUS

## 2018-11-24 MED ORDER — ACETAMINOPHEN 325 MG PO TABS: 650.0000 mg | ORAL_TABLET | ORAL | Status: DC | PRN

## 2018-11-24 MED ORDER — MIDAZOLAM HCL 2 MG/2ML IJ SOLN
INTRAMUSCULAR | Status: AC
  Filled 2018-11-24: qty 2

## 2018-11-24 MED ORDER — ACETAMINOPHEN 160 MG/5ML PO SOLN: 325.0000 mg | ORAL | Status: DC | PRN

## 2018-11-24 MED ORDER — ACETAMINOPHEN 325 MG PO TABS: 325.0000 mg | ORAL_TABLET | ORAL | Status: DC | PRN

## 2018-11-24 MED ORDER — GABAPENTIN 300 MG PO CAPS
300.0000 mg | ORAL_CAPSULE | ORAL | Status: DC
  Filled 2018-11-24: qty 1

## 2018-11-24 MED ORDER — FENTANYL CITRATE (PF) 100 MCG/2ML IJ SOLN: 25.0000 ug | INTRAMUSCULAR | Status: DC | PRN

## 2018-11-24 MED ORDER — CEFAZOLIN SODIUM-DEXTROSE 2-4 GM/100ML-% IV SOLN
2.0000 g | INTRAVENOUS | Status: AC
  Administered 2018-11-24: 2 g via INTRAVENOUS
  Filled 2018-11-24: qty 100

## 2018-11-24 MED ORDER — LACTATED RINGERS IV SOLN
INTRAVENOUS | Status: DC
  Administered 2018-11-24 (×2): via INTRAVENOUS

## 2018-11-24 MED ORDER — CHLORHEXIDINE GLUCONATE CLOTH 2 % EX PADS: 6.0000 | MEDICATED_PAD | Freq: Once | CUTANEOUS | Status: DC

## 2018-11-24 MED ORDER — OXYCODONE HCL 5 MG PO TABS: 5.0000 mg | ORAL_TABLET | ORAL | Status: DC | PRN

## 2018-11-24 MED ORDER — ACETAMINOPHEN 500 MG PO TABS
1000.0000 mg | ORAL_TABLET | ORAL | Status: DC
  Filled 2018-11-24: qty 2

## 2018-11-24 MED ORDER — DEXAMETHASONE SODIUM PHOSPHATE 10 MG/ML IJ SOLN
INTRAMUSCULAR | Status: DC | PRN
  Administered 2018-11-24: 10 mg via INTRAVENOUS

## 2018-11-24 MED ORDER — KETOROLAC TROMETHAMINE 30 MG/ML IJ SOLN
INTRAMUSCULAR | Status: AC
  Administered 2018-11-24: 30 mg via INTRAVENOUS
  Filled 2018-11-24: qty 1

## 2018-11-24 MED ORDER — OXYCODONE HCL 5 MG PO TABS: 5.0000 mg | ORAL_TABLET | Freq: Once | ORAL | Status: DC | PRN

## 2018-11-24 MED ORDER — PROPOFOL 500 MG/50ML IV EMUL
INTRAVENOUS | Status: DC | PRN
  Administered 2018-11-24: 150 ug/kg/min via INTRAVENOUS

## 2018-11-24 MED ORDER — BUPIVACAINE-EPINEPHRINE 0.25% -1:200000 IJ SOLN
INTRAMUSCULAR | Status: DC | PRN
  Administered 2018-11-24: 15 mL

## 2018-11-24 MED ORDER — LACTATED RINGERS IV SOLN
INTRAVENOUS | Status: DC
  Administered 2018-11-24: 11:00:00 via INTRAVENOUS

## 2018-11-24 MED ORDER — MEPERIDINE HCL 50 MG/ML IJ SOLN: 6.2500 mg | INTRAMUSCULAR | Status: DC | PRN

## 2018-11-24 MED ORDER — FENTANYL CITRATE (PF) 100 MCG/2ML IJ SOLN
INTRAMUSCULAR | Status: DC | PRN
  Administered 2018-11-24 (×2): 50 ug via INTRAVENOUS

## 2018-11-24 MED ORDER — MIDAZOLAM HCL 2 MG/2ML IJ SOLN
INTRAMUSCULAR | Status: DC | PRN
  Administered 2018-11-24: 2 mg via INTRAVENOUS

## 2018-11-24 MED ORDER — PROPOFOL 10 MG/ML IV BOLUS
INTRAVENOUS | Status: DC | PRN
  Administered 2018-11-24: 30 mg via INTRAVENOUS

## 2018-11-24 MED ORDER — CELECOXIB 200 MG PO CAPS: 200.0000 mg | ORAL_CAPSULE | ORAL | Status: DC

## 2018-11-24 MED ORDER — ONDANSETRON HCL 4 MG/2ML IJ SOLN
INTRAMUSCULAR | Status: DC | PRN
  Administered 2018-11-24: 4 mg via INTRAVENOUS

## 2018-11-24 MED ORDER — OXYCODONE HCL 5 MG/5ML PO SOLN
5.0000 mg | Freq: Once | ORAL | Status: DC | PRN
  Filled 2018-11-24: qty 5

## 2018-11-24 MED ORDER — PROPOFOL 10 MG/ML IV BOLUS
INTRAVENOUS | Status: AC
  Filled 2018-11-24: qty 40

## 2018-11-24 MED ORDER — BUPIVACAINE-EPINEPHRINE (PF) 0.25% -1:200000 IJ SOLN
INTRAMUSCULAR | Status: AC
  Filled 2018-11-24: qty 30

## 2018-11-24 MED ORDER — LACTATED RINGERS IV SOLN: INTRAVENOUS | Status: DC

## 2018-11-24 MED ORDER — ACETAMINOPHEN 500 MG PO TABS: 1000.0000 mg | ORAL_TABLET | Freq: Four times a day (QID) | ORAL | Status: DC

## 2018-11-24 MED ORDER — SODIUM CHLORIDE 0.9 % IV SOLN: 250.0000 mL | INTRAVENOUS | Status: DC | PRN

## 2018-11-24 MED ORDER — SODIUM CHLORIDE 0.9% FLUSH: 3.0000 mL | INTRAVENOUS | Status: DC | PRN

## 2018-11-24 MED ORDER — PROMETHAZINE HCL 25 MG/ML IJ SOLN: 6.2500 mg | INTRAMUSCULAR | Status: DC | PRN

## 2018-11-24 MED ORDER — FENTANYL CITRATE (PF) 100 MCG/2ML IJ SOLN
INTRAMUSCULAR | Status: AC
  Filled 2018-11-24: qty 2

## 2018-11-24 MED ORDER — ONDANSETRON HCL 4 MG/2ML IJ SOLN: 4.0000 mg | Freq: Once | INTRAMUSCULAR | Status: DC | PRN

## 2018-11-24 MED ORDER — SODIUM CHLORIDE 0.9% FLUSH: 3.0000 mL | Freq: Two times a day (BID) | INTRAVENOUS | Status: DC

## 2018-11-24 MED ORDER — ACETAMINOPHEN 650 MG RE SUPP
650.0000 mg | RECTAL | Status: DC | PRN
  Filled 2018-11-24: qty 1

## 2018-11-24 MED ORDER — CELECOXIB 200 MG PO CAPS
ORAL_CAPSULE | ORAL | Status: AC
  Filled 2018-11-24: qty 1

## 2018-11-24 MED ORDER — KETOROLAC TROMETHAMINE 30 MG/ML IJ SOLN
30.0000 mg | Freq: Once | INTRAMUSCULAR | Status: AC
  Administered 2018-11-24: 30 mg via INTRAVENOUS

## 2018-11-24 MED ORDER — 0.9 % SODIUM CHLORIDE (POUR BTL) OPTIME
TOPICAL | Status: DC | PRN
  Administered 2018-11-24: 1000 mL

## 2018-11-24 MED ORDER — HYDROMORPHONE HCL 1 MG/ML IJ SOLN: 0.2500 mg | INTRAMUSCULAR | Status: DC | PRN

## 2018-11-24 MED ORDER — OXYCODONE HCL 5 MG/5ML PO SOLN: 5.0000 mg | Freq: Once | ORAL | Status: DC | PRN

## 2018-11-24 SURGICAL SUPPLY — 22 items
BLADE HEX COATED 2.75 (ELECTRODE) ×2 IMPLANT
BLADE SURG 15 STRL LF DISP TIS (BLADE) ×1 IMPLANT
BLADE SURG 15 STRL SS (BLADE) ×1
BLADE SURG SZ10 CARB STEEL (BLADE) ×2 IMPLANT
CHLORAPREP W/TINT 26ML (MISCELLANEOUS) ×2 IMPLANT
COVER WAND RF STERILE (DRAPES) ×2 IMPLANT
DERMABOND ADVANCED (GAUZE/BANDAGES/DRESSINGS) ×1
DERMABOND ADVANCED .7 DNX12 (GAUZE/BANDAGES/DRESSINGS) ×1 IMPLANT
DRAPE LAPAROTOMY T 98X78 PEDS (DRAPES) ×2 IMPLANT
ELECT REM PT RETURN 15FT ADLT (MISCELLANEOUS) ×2 IMPLANT
GLOVE BIOGEL PI IND STRL 7.0 (GLOVE) ×1 IMPLANT
GLOVE BIOGEL PI INDICATOR 7.0 (GLOVE) ×1
GLOVE EUDERMIC 7 POWDERFREE (GLOVE) ×2 IMPLANT
GOWN STRL REUS W/TWL LRG LVL3 (GOWN DISPOSABLE) ×2 IMPLANT
GOWN STRL REUS W/TWL XL LVL3 (GOWN DISPOSABLE) ×2 IMPLANT
KIT BASIN OR (CUSTOM PROCEDURE TRAY) ×2 IMPLANT
NEEDLE HYPO 22GX1.5 SAFETY (NEEDLE) ×2 IMPLANT
PACK GENERAL/GYN (CUSTOM PROCEDURE TRAY) ×2 IMPLANT
SUT MNCRL AB 4-0 PS2 18 (SUTURE) ×2 IMPLANT
SUT VIC AB 3-0 SH 18 (SUTURE) ×2 IMPLANT
SYR CONTROL 10ML LL (SYRINGE) ×2 IMPLANT
TOWEL OR 17X26 10 PK STRL BLUE (TOWEL DISPOSABLE) ×2 IMPLANT

## 2018-11-24 NOTE — Anesthesia Postprocedure Evaluation (Signed)
Anesthesia Post Note  Patient: Taylor Flynn  Procedure(s) Performed: EXCISION NODULE LEFT CHEST WALL ERAS PATHWAY (Left )     Patient location during evaluation: PACU Anesthesia Type: MAC Level of consciousness: awake and alert Pain management: pain level controlled Vital Signs Assessment: post-procedure vital signs reviewed and stable Respiratory status: spontaneous breathing, nonlabored ventilation, respiratory function stable and patient connected to nasal cannula oxygen Cardiovascular status: stable and blood pressure returned to baseline Postop Assessment: no apparent nausea or vomiting Anesthetic complications: no    Last Vitals:  Vitals:   11/24/18 1245 11/24/18 1255  BP: 123/88 124/66  Pulse: (!) 57 (!) 58  Resp: 14 16  Temp: (!) 36.1 C   SpO2: 100% 100%    Last Pain:  Vitals:   11/24/18 1255  TempSrc:   PainSc: 0-No pain                 Stony Stegmann

## 2018-11-24 NOTE — Anesthesia Preprocedure Evaluation (Signed)
Anesthesia Evaluation  Patient identified by MRN, date of birth, ID band Patient awake    Reviewed: Allergy & Precautions, H&P , NPO status , Patient's Chart, lab work & pertinent test results, reviewed documented beta blocker date and time   Airway Mallampati: II  TM Distance: >3 FB Neck ROM: full    Dental no notable dental hx.    Pulmonary neg pulmonary ROS,    Pulmonary exam normal breath sounds clear to auscultation       Cardiovascular Exercise Tolerance: Good negative cardio ROS   Rhythm:regular Rate:Normal     Neuro/Psych negative neurological ROS  negative psych ROS   GI/Hepatic negative GI ROS, Neg liver ROS,   Endo/Other  negative endocrine ROS  Renal/GU negative Renal ROS  negative genitourinary   Musculoskeletal   Abdominal   Peds  Hematology negative hematology ROS (+)   Anesthesia Other Findings   Reproductive/Obstetrics negative OB ROS                             Anesthesia Physical Anesthesia Plan  ASA: II  Anesthesia Plan: MAC   Post-op Pain Management:    Induction:   PONV Risk Score and Plan: 2 and Ondansetron, Treatment may vary due to age or medical condition and Midazolam  Airway Management Planned: Nasal Cannula and Simple Face Mask  Additional Equipment:   Intra-op Plan:   Post-operative Plan: Extubation in OR  Informed Consent: I have reviewed the patients History and Physical, chart, labs and discussed the procedure including the risks, benefits and alternatives for the proposed anesthesia with the patient or authorized representative who has indicated his/her understanding and acceptance.     Dental Advisory Given  Plan Discussed with: CRNA, Anesthesiologist and Surgeon  Anesthesia Plan Comments:         Anesthesia Quick Evaluation

## 2018-11-24 NOTE — Discharge Instructions (Signed)
The incision in the left anterior chest wall is closed with dissolving sutures under the skin. The skin is painted with a clear plastic superglue, which will wear off in about 3 weeks  Ice pack to this area for about 10 minutes at a time, 2 or 3 times an hour You may take a quick shower, starting tomorrow No tub baths hot tubs or swimming pools for 3 weeks  You may drive your car in 2 or 3 days.  Use Tylenol and ibuprofen for pain as we discussed You have Toradol at home if necessary  Dr. Dalbert Batman should call the report to you by noon on Friday Call Dr. Darrel Hoover office if this does not happen  Keep your appointment with Dr. Marin Olp to discuss management options  See Dr. Dalbert Batman in 3 weeks.  Call to make an appointment if necessary

## 2018-11-24 NOTE — Interval H&P Note (Signed)
History and Physical Interval Note:  11/24/2018 11:08 AM  Taylor Flynn  has presented today for surgery, with the diagnosis of etastatic breast cancer  The various methods of treatment have been discussed with the patient and family. After consideration of risks, benefits and other options for treatment, the patient has consented to  Procedure(s): [place] (Left) as a surgical intervention .  The patient's history has been reviewed, patient examined, no change in status, stable for surgery.  I have reviewed the patient's chart and labs.  Questions were answered to the patient's satisfaction.     Adin Hector

## 2018-11-24 NOTE — Op Note (Signed)
Patient Name:           Taylor Flynn   Date of Surgery:        11/24/2018  Pre op Diagnosis:      Metastatic breast cancer  Post op Diagnosis:    Metastatic breast cancer  Procedure:                 Excision 1 cm malignant nodule left inferior anterior chest wall with margin assessment  Surgeon:                     Edsel Petrin. Dalbert Batman, M.D., FACS  Assistant:                      Or staff  Operative Indications:  This is a very pleasant 43 year old woman, referred by Dr. Burney Gauze for excision of left anterior chest wall nodule thought to be metastatic breast cancer.    In April 2017 she lived in [place], [place] and presented with a large ulcerated tumor of her left breast. She has not had breast surgery. Reportedly this is a HER-2 positive tumor. A port was placed on the right side and she received Herceptin and the tumor regressed and the skin ulcerated, reportedly, and then healed. She states that she has liver lesions that have responded to chemotherapy. She says there was a left anterior chest wall nodule, never biopsy, that regressed. 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 She has now moved to [place] to be with her brother sister and mother who lives in [place] and is newly established with Dr. Marin Olp. Dr. Marin Olp continues her suppressive chemotherapy but noticed a nodule of the left anterior chest wall and requests that the excised for histologic confirmation and breast diagnostic profile.     Comorbidities include the metastatic breast cancer. Narcolepsy. Urinary urgency. She had a pelvic ultrasound yesterday which was reportedly normal. Family history reveals a half sister had DCIS. 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 Doing locums work.. Used to live in [place]. Moved to [place]. Divorced. 2 children. Ages 63 and 40.  Denies alcohol or tobacco. Lives with her brother.  Mother is with her today.     We discussed and she agreed that we would proceed next week with complete excision of this area under monitored sedation. I described an elliptical incision trying to get clean margins all around and she agrees with that. I discussed the indications, details, techniques, and risk of the surgery with her in detail. She is aware the risk of bleeding, infection, nerve damage with chronic pain, local recurrence. She understands all of these issues. All her questions were answered. She agrees with this plan.  Operative Findings:       The nodule of her left anterior chest wall was above the costal margin.  It is at least 1 cm in diameter but quite mobile.  It projects above the skin surface and does not appear fixed to the chest wall.  I made a 4 cm transverse by 2.5 cm vertical by 1 cm deep incision to get widely around this nodule.  The margins were marked with silk sutures.  Primary layered closure  Procedure in Detail:          Patient was brought to the operating room.  She was monitored and sedated by  the anesthesia department.  The left chest wall was prepped and draped in a sterile fashion.  Intravenous antibiotics were given.  Surgical timeout was performed.  0.25% Marcaine with epinephrine was used as a local infiltration anesthetic.    I drew a elliptical incision with a marking pen, 4 cm transverse by 2.5 cm vertical which appeared to be widely around the tumor itself.  Incision was made with a knife.  The specimen was excised all the way down to the pectoralis fascia.  I marked the superior and lateral margins with silk sutures to orient the pathologist and the specimen was sent to the lab with the history attached.  Hemostasis was excellent.  Subcutaneous tissue was closed with 3-0 Vicryl sutures and the skin closed with a running subcuticular 4-0 Monocryl and Dermabond.  The patient tolerated the procedure well  was taken to PACU in stable condition.  EBL 10 cc or less.  Counts correct.  Complications none.   Addendum: I logged onto the PMP aware website and reviewed her prescription medication history.     Edsel Petrin. Dalbert Batman, M.D., FACS General and Minimally Invasive Surgery Breast and Colorectal Surgery  11/24/2018 12:09 PM

## 2018-11-24 NOTE — Transfer of Care (Signed)
Immediate Anesthesia Transfer of Care Note  Patient: Taylor Flynn  Procedure(s) Performed: EXCISION NODULE LEFT CHEST WALL ERAS PATHWAY (Left )  Patient Location: PACU  Anesthesia Type:MAC  Level of Consciousness: sedated and responds to stimulation  Airway & Oxygen Therapy: Patient Spontanous Breathing and Patient connected to face mask oxygen  Post-op Assessment: Report given to RN and Post -op Vital signs reviewed and stable  Post vital signs: stable  Last Vitals:  Vitals Value Taken Time  BP 103/57 11/24/2018 12:08 PM  Temp    Pulse 54 11/24/2018 12:09 PM  Resp 10 11/24/2018 12:09 PM  SpO2 100 % 11/24/2018 12:09 PM  Vitals shown include unvalidated device data.  Last Pain:  Vitals:   11/24/18 1034  TempSrc: Oral         Complications: No apparent anesthesia complications

## 2018-11-25 ENCOUNTER — Encounter (HOSPITAL_COMMUNITY): Payer: Self-pay | Admitting: General Surgery

## 2018-11-25 ENCOUNTER — Other Ambulatory Visit (HOSPITAL_BASED_OUTPATIENT_CLINIC_OR_DEPARTMENT_OTHER)

## 2018-11-26 ENCOUNTER — Encounter: Payer: Self-pay | Admitting: Obstetrics & Gynecology

## 2018-11-26 ENCOUNTER — Telehealth: Payer: Self-pay | Admitting: Obstetrics & Gynecology

## 2018-11-26 NOTE — Telephone Encounter (Signed)
Called and left a message for patient to call back and ask for [facility] or Starla to schedule a new patient doctor referral appointment with our office to see Dr. Sabra Heck for: Endometrial thickening on ultrasound.  If she is available, Dr. Sabra Heck can see her this afternoon at to arrive at 3:00 PM for a 3:30 PM appointment.  Routing to [employer] for [employer].

## 2018-11-27 ENCOUNTER — Ambulatory Visit

## 2018-12-01 ENCOUNTER — Ambulatory Visit (HOSPITAL_BASED_OUTPATIENT_CLINIC_OR_DEPARTMENT_OTHER): Admission: RE | Admit: 2018-12-01 | Source: Ambulatory Visit

## 2018-12-01 ENCOUNTER — Ambulatory Visit (HOSPITAL_BASED_OUTPATIENT_CLINIC_OR_DEPARTMENT_OTHER)
Admission: RE | Admit: 2018-12-01 | Discharge: 2018-12-01 | Disposition: A | Discharge status: Home / Self Care | Source: Ambulatory Visit | Attending: Family | Admitting: Family

## 2018-12-01 DIAGNOSIS — C50919 Malignant neoplasm of unspecified site of unspecified female breast: Secondary | ICD-10-CM | POA: Diagnosis not present

## 2018-12-01 DIAGNOSIS — C50912 Malignant neoplasm of unspecified site of left female breast: Secondary | ICD-10-CM | POA: Diagnosis not present

## 2018-12-01 DIAGNOSIS — C792 Secondary malignant neoplasm of skin: Secondary | ICD-10-CM | POA: Diagnosis not present

## 2018-12-01 DIAGNOSIS — C787 Secondary malignant neoplasm of liver and intrahepatic bile duct: Secondary | ICD-10-CM | POA: Diagnosis not present

## 2018-12-01 NOTE — Progress Notes (Signed)
  Echocardiogram 2D Echocardiogram has been performed.  Maguadalupe Lata T Monico Sudduth 12/01/2018, 11:57 AM

## 2018-12-03 ENCOUNTER — Ambulatory Visit (INDEPENDENT_AMBULATORY_CARE_PROVIDER_SITE_OTHER): Admitting: Obstetrics & Gynecology

## 2018-12-03 ENCOUNTER — Encounter: Payer: Self-pay | Admitting: Obstetrics & Gynecology

## 2018-12-03 ENCOUNTER — Other Ambulatory Visit (HOSPITAL_COMMUNITY)
Admission: RE | Admit: 2018-12-03 | Discharge: 2018-12-03 | Disposition: A | Discharge status: Home / Self Care | Source: Ambulatory Visit | Attending: Obstetrics & Gynecology | Admitting: Obstetrics & Gynecology

## 2018-12-03 ENCOUNTER — Other Ambulatory Visit: Payer: Self-pay

## 2018-12-03 VITALS — BP 112/60 | HR 68 | Resp 16 | Ht 63.5 in | Wt 113.2 lb

## 2018-12-03 DIAGNOSIS — Z01411 Encounter for gynecological examination (general) (routine) with abnormal findings: Secondary | ICD-10-CM | POA: Diagnosis not present

## 2018-12-03 DIAGNOSIS — Z113 Encounter for screening for infections with a predominantly sexual mode of transmission: Secondary | ICD-10-CM

## 2018-12-03 DIAGNOSIS — Z124 Encounter for screening for malignant neoplasm of cervix: Secondary | ICD-10-CM | POA: Insufficient documentation

## 2018-12-03 NOTE — Progress Notes (Signed)
43 y.o. [id number] Single Black or African American female here for new patient exam.    Her hx is significant for stage IV breast cancer that started in the L breast and was quite large (~8cm).  It did erupt at one point and she now has significant scarring on the left breast.  She does have liver metastases and had a left abdominal wall mass that was excised about two weeks ago showing an abdominal wall occurrence.  Her tumor is ER/PR- and her2 nu positive.  She initially use complimentary medicine treatments and has been treated at various locations during the last several years.  She moved back to the [place] area to be near family.  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    She is currently being treated with Herceptin and Perjeta and has experienced a good response with this.  She has not abnormal breast finding except for the scarring and her two PET scans done locally have been negative for any distant disease.  As well, the axillary disease on the left side has reduced in size as well.    She has been experiencing some abdominal bloating that has been ongoing for months.  Her prior provider in [place] wanted her to have an ultrasound.  She did not have this done before leaving the [place] area.  This was ordered by [name] and eon 11/19/18.    Results showed uterus to measure 9.4 x 4.7 x 5.47m with heterogenous echotexture possible consistent with adenomyosis. Endometrium was 345mRight ovary measured 3.0 x 3.4 x 1.6cm with a small follicle Left ovary measured 2.9 x 1.1 x 2.3cm.  No follicles noted. Small amount of PCDS fluid and left adnexal fluid noted.  These are small pockets of fluid.  Images reviewed with pt personally today.  Cycles are about every 21 to 23 days.  Flow lasts 4-5 days and is not heavy.  She does not pass clots. Patient's last menstrual period was 11/13/2018 (exact date).          Sexually active: No.   The current method of family planning is abstinence.    Exercising: Yes.    yoga, walking, stretching  Smoker:  no  Health Maintenance: Pap:  ~ 3 years ago History of abnormal Pap:  no MMG:  02/2016 Left breast cancer  Colonoscopy:  Never BMD:   Never TDaP: 10/28/14 Screening Labs:    reports that she has never smoked. She has never used smokeless tobacco. She reports previous alcohol use. She reports that she does not use drugs.  Past Medical History:  Diagnosis Date  . Breast cancer metastasized to liver, unspecified laterality [place]  . 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, [place]  . [place] [place]  . Cellulitis    RESOLVED  . Narcolepsy   . Scoliosis    MILD  . Urge incontinence     Past Surgical History:  Procedure Laterality Date  . BREAST BIOPSY Left 01/2016  . CESAREAN SECTION  2012  . HERNIA REPAIR     ? Inguinal, but patient unsure.  Age 89  . MASS EXCISION Left 11/24/2018   Procedure: EXCISION NODULE LEFT CHEST WALL ERAS PATHWAY;  Surgeon: InFanny SkatesMD;  Location: [facility];  Service: General;  Laterality: Left;  . SENTINEL NODE BIOPSY Left 2017 APRIL    Current Outpatient Medications  Medication Sig Dispense Refill  . bicalutamide (CASODEX) 50 MG  tablet Take 150 mg by mouth daily.     . Coenzyme Q10 (COQ10 PO) Take 1 Dose by mouth 2 (two) times daily.    . cyclophosphamide (CYTOXAN) 50 MG capsule Take 50 mg by mouth every evening.     Magdalene Molly MISTLETOE [place] Inject 100 mg into the skin every Monday, Wednesday, and Friday.     . ivermectin (STROMECTOL) 3 MG TABS tablet Take 6 mg by mouth daily.   0  . Menaquinone-7 (VITAMIN K2 PO) Take by mouth daily.    . methotrexate (RHEUMATREX) 2.5 MG tablet Take 2.5 mg by mouth 2 (two) times a week. Caution:Chemotherapy. Protect from light. Sundays and Wednesdays    . NALTREXONE HCL PO Take 4.5 mg by mouth See admin instructions. Take 4.5 mg by mouth at night Wen through Sat    . OVER THE COUNTER  MEDICATION Take 1 tablet by mouth daily. calcium microcrystalline hydroxyapatite otc supplement    . OVER THE COUNTER MEDICATION Take 2-4 capsules by mouth daily. Salvestrol-  Take 4 capsules every morning and 2 capsules in the aftenroon    . OVER THE COUNTER MEDICATION Take 1-2 capsules by mouth See admin instructions. lumbrokinase - Take 1 capsule in the morning 2 capsules at lunch and 1 capsule at bedtime    . OVER THE COUNTER MEDICATION Take 1 Dose by mouth at bedtime. cbd oil    . OVER THE COUNTER MEDICATION Take 4 capsules by mouth See admin instructions. fenbencur take 4 capsules by mouth at bedtime Sunday through Tuesday    . OVER THE COUNTER MEDICATION Take 3 drops by mouth 3 (three) times daily. immunis    . OVER THE COUNTER MEDICATION Take 3 drops by mouth 3 (three) times daily. Phung-ex    . OVER THE COUNTER MEDICATION Take 1 Dose by mouth daily. Lymph-tone 2    . OVER THE COUNTER MEDICATION Take 1 Dose by mouth daily. Pineal code    . OVER THE COUNTER MEDICATION Take 30 mLs by mouth 2 (two) times daily. Haelen    . Pertuzumab (PERJETA IV) Inject 1 Dose into the vein every 21 ( twenty-one) days. Combined with Herceptin    . Probiotic CAPS Take 1 capsule by mouth every other day. With a meal    . propranolol (INNOPRAN XL) 80 MG 24 hr capsule Take 80 mg by mouth at bedtime.    . Trastuzumab (HERCEPTIN IV) Inject 1 Dose into the vein every 21 ( twenty-one) days. Combined with Perjeta    . VITAMIN D-VITAMIN K PO Place 5 drops under the tongue daily.    . Zoledronic Acid (ZOMETA IV) Inject 1 Dose into the vein every 6 (six) weeks.     Marland Kitchen EPINEPHrine 0.3 mg/0.3 mL IJ SOAJ injection Inject 0.3 mg into the muscle as needed for anaphylaxis.   0   No current facility-administered medications for this visit.     Family History  Problem Relation Age of Onset  . Aortic aneurysm Mother   . Hypertension Mother   . Dementia Father   . Asthma Sister   . Asthma Brother   . Breast cancer Maternal  Aunt   . Diabetes Maternal Grandmother   . Leukemia Maternal Grandmother   . Prostate cancer Maternal Grandfather   . Eczema Daughter   . Prostate cancer Brother   . Breast cancer Sister     Review of Systems  All other systems reviewed and are negative.   Exam:   BP 112/60 (BP  Location: Right Arm, Patient Position: Sitting, Cuff Size: Normal)   Pulse 68   Resp 16   Ht 5' 3.5" (1.613 m)   Wt 113 lb 3.2 oz (51.3 kg)   LMP 11/13/2018 (Exact Date)   BMI 19.74 kg/m     Height: 5' 3.5" (161.3 cm)  Ht Readings from Last 3 Encounters:  12/03/18 5' 3.5" (1.613 m)  11/24/18 _0  (1.6 m)  10/05/18 _1  (1.626 m)    General appearance: alert, cooperative and appears stated age Head: Normocephalic, without obvious abnormality, atraumatic Neck: no adenopathy, supple, symmetrical, trachea midline and thyroid normal to inspection and palpation Lungs: clear to auscultation bilaterally Breasts: minimal breast tissue present, scarring on left breast skin but no masses, LAD, or nipple discharge noted in either side Heart: regular rate and rhythm Abdomen: soft, non-tender; bowel sounds normal; no masses,  no organomegaly Extremities: extremities normal, atraumatic, no cyanosis or edema Skin: Skin color, texture, turgor normal. No rashes or lesions Lymph nodes: Cervical, supraclavicular, and axillary nodes normal. No abnormal inguinal nodes palpated Neurologic: Grossly normal   Pelvic: External genitalia:  no lesions              Urethra:  normal appearing urethra with no masses, tenderness or lesions              Bartholins and Skenes: normal                 Vagina: normal appearing vagina with normal color and discharge, no lesions              Cervix: no lesions              Pap taken: Yes.   Bimanual Exam:  Uterus:  normal size, contour, position, consistency, mobility, non-tender              Adnexa: normal adnexa and no mass, fullness, tenderness               Rectovaginal:  Confirms               Anus:  normal sphincter tone, no lesions  Chaperone was present for exam.  A:  Well Woman with normal exam H/O stage IV breast cancer undergoing treatment with herceptin and Perjeta with good response, negative PET scans x 2 H/o liver mets pt pt.  I do not have any imaging that shows this (she has results and will bring a copy) Polymenorrhea for which she does not desire any treatment Anemia with hb 10.4 1/14/202 and iron deficiency, trying to improve with diet/supplements  P:   Mammogram guidelines are going to be given by Dr. Marin Olp pap smear and HR HPV obtained today Possible adenomyosis of uterus discussed today.  Does not desire treatment for this or polymenorrhea at this time Declines any vaccinations Desires help with PCP and referral to lymphedema clinic return annually or prn

## 2018-12-07 LAB — CYTOLOGY - PAP
Chlamydia: NEGATIVE
Diagnosis: NEGATIVE
HPV: NOT DETECTED
Neisseria Gonorrhea: NEGATIVE
Trichomonas: NEGATIVE

## 2018-12-08 ENCOUNTER — Inpatient Hospital Stay: Attending: Hematology & Oncology | Admitting: Hematology & Oncology

## 2018-12-08 ENCOUNTER — Encounter (HOSPITAL_COMMUNITY): Payer: Self-pay | Admitting: Hematology & Oncology

## 2018-12-08 ENCOUNTER — Encounter: Payer: Self-pay | Admitting: Hematology & Oncology

## 2018-12-08 ENCOUNTER — Other Ambulatory Visit: Payer: Self-pay

## 2018-12-08 ENCOUNTER — Inpatient Hospital Stay

## 2018-12-08 VITALS — BP 115/56 | HR 66 | Temp 97.6°F | Resp 17 | Wt 114.4 lb

## 2018-12-08 DIAGNOSIS — Z79899 Other long term (current) drug therapy: Secondary | ICD-10-CM | POA: Insufficient documentation

## 2018-12-08 DIAGNOSIS — C50912 Malignant neoplasm of unspecified site of left female breast: Secondary | ICD-10-CM | POA: Insufficient documentation

## 2018-12-08 DIAGNOSIS — C773 Secondary and unspecified malignant neoplasm of axilla and upper limb lymph nodes: Secondary | ICD-10-CM | POA: Diagnosis not present

## 2018-12-08 DIAGNOSIS — C787 Secondary malignant neoplasm of liver and intrahepatic bile duct: Secondary | ICD-10-CM | POA: Diagnosis not present

## 2018-12-08 DIAGNOSIS — C50812 Malignant neoplasm of overlapping sites of left female breast: Secondary | ICD-10-CM

## 2018-12-08 DIAGNOSIS — Z5112 Encounter for antineoplastic immunotherapy: Secondary | ICD-10-CM | POA: Diagnosis not present

## 2018-12-08 DIAGNOSIS — C792 Secondary malignant neoplasm of skin: Secondary | ICD-10-CM

## 2018-12-08 DIAGNOSIS — Z171 Estrogen receptor negative status [ER-]: Secondary | ICD-10-CM | POA: Diagnosis not present

## 2018-12-08 DIAGNOSIS — C50919 Malignant neoplasm of unspecified site of unspecified female breast: Secondary | ICD-10-CM

## 2018-12-08 LAB — LACTATE DEHYDROGENASE: LDH: 173 U/L (ref 98–192)

## 2018-12-08 LAB — CBC WITH DIFFERENTIAL (CANCER CENTER ONLY)
Abs Immature Granulocytes: 0.01 10*3/uL (ref 0.00–0.07)
Basophils Absolute: 0 10*3/uL (ref 0.0–0.1)
Basophils Relative: 0 %
Eosinophils Absolute: 0.1 10*3/uL (ref 0.0–0.5)
Eosinophils Relative: 2 %
HCT: 30.7 % — ABNORMAL LOW (ref 36.0–46.0)
Hemoglobin: 9.8 g/dL — ABNORMAL LOW (ref 12.0–15.0)
Immature Granulocytes: 0 %
Lymphocytes Relative: 19 %
Lymphs Abs: 0.9 10*3/uL (ref 0.7–4.0)
MCH: 29.3 pg (ref 26.0–34.0)
MCHC: 31.9 g/dL (ref 30.0–36.0)
MCV: 91.9 fL (ref 80.0–100.0)
Monocytes Absolute: 0.4 10*3/uL (ref 0.1–1.0)
Monocytes Relative: 8 %
Neutro Abs: 3.3 10*3/uL (ref 1.7–7.7)
Neutrophils Relative %: 71 %
Platelet Count: 270 10*3/uL (ref 150–400)
RBC: 3.34 MIL/uL — ABNORMAL LOW (ref 3.87–5.11)
RDW: 14 % (ref 11.5–15.5)
WBC Count: 4.7 10*3/uL (ref 4.0–10.5)
nRBC: 0 % (ref 0.0–0.2)

## 2018-12-08 LAB — CMP (CANCER CENTER ONLY)
ALT: 8 U/L (ref 0–44)
AST: 16 U/L (ref 15–41)
Albumin: 4.4 g/dL (ref 3.5–5.0)
Alkaline Phosphatase: 54 U/L (ref 38–126)
Anion gap: 6 (ref 5–15)
BUN: 12 mg/dL (ref 6–20)
CO2: 28 mmol/L (ref 22–32)
Calcium: 9.8 mg/dL (ref 8.9–10.3)
Chloride: 104 mmol/L (ref 98–111)
Creatinine: 1.02 mg/dL — ABNORMAL HIGH (ref 0.44–1.00)
GFR, Est AFR Am: >60 mL/min (ref >60)
GFR, Estimated: >60 mL/min (ref >60)
Glucose, Bld: 76 mg/dL (ref 70–99)
Potassium: 4 mmol/L (ref 3.5–5.1)
Sodium: 138 mmol/L (ref 135–145)
Total Bilirubin: 0.3 mg/dL (ref 0.3–1.2)
Total Protein: 6.7 g/dL (ref 6.5–8.1)

## 2018-12-08 MED ORDER — SODIUM CHLORIDE 0.9 % IV SOLN
420.0000 mg | Freq: Once | INTRAVENOUS | Status: AC
  Administered 2018-12-08: 420 mg via INTRAVENOUS
  Filled 2018-12-08: qty 14

## 2018-12-08 MED ORDER — SODIUM CHLORIDE 0.9 % IV SOLN
Freq: Once | INTRAVENOUS | Status: AC
  Administered 2018-12-08: 11:00:00 via INTRAVENOUS
  Filled 2018-12-08: qty 250

## 2018-12-08 MED ORDER — HEPARIN SOD (PORK) LOCK FLUSH 100 UNIT/ML IV SOLN
500.0000 [IU] | Freq: Once | INTRAVENOUS | Status: AC | PRN
  Administered 2018-12-08: 500 [IU]
  Filled 2018-12-08: qty 5

## 2018-12-08 MED ORDER — ACETAMINOPHEN 325 MG PO TABS: 650.0000 mg | ORAL_TABLET | Freq: Once | ORAL | Status: DC

## 2018-12-08 MED ORDER — SODIUM CHLORIDE 0.9 % IV SOLN
Freq: Once | INTRAVENOUS | Status: AC
  Filled 2018-12-08: qty 250

## 2018-12-08 MED ORDER — ZOLEDRONIC ACID 4 MG/100ML IV SOLN: 4.0000 mg | Freq: Once | INTRAVENOUS | Status: DC

## 2018-12-08 MED ORDER — DIPHENHYDRAMINE HCL 25 MG PO CAPS: 50.0000 mg | ORAL_CAPSULE | Freq: Once | ORAL | Status: DC

## 2018-12-08 MED ORDER — TRASTUZUMAB CHEMO 150 MG IV SOLR
300.0000 mg | Freq: Once | INTRAVENOUS | Status: AC
  Administered 2018-12-08: 300 mg via INTRAVENOUS
  Filled 2018-12-08: qty 14.29

## 2018-12-08 MED ORDER — SODIUM CHLORIDE 0.9% FLUSH
10.0000 mL | INTRAVENOUS | Status: DC | PRN
  Administered 2018-12-08: 10 mL
  Filled 2018-12-08: qty 10

## 2018-12-08 NOTE — Patient Instructions (Signed)
Pertuzumab injection What is this medicine? PERTUZUMAB (per TOOZ ue mab) is a monoclonal antibody. It is used to treat breast cancer. This medicine may be used for other purposes; ask your health care provider or pharmacist if you have questions. COMMON BRAND NAME(S): PERJETA What should I tell my health care provider before I take this medicine? They need to know if you have any of these conditions: -heart disease -heart failure -high blood pressure -history of irregular heart beat -recent or ongoing radiation therapy -an unusual or allergic reaction to pertuzumab, other medicines, foods, dyes, or preservatives -pregnant or trying to get pregnant -breast-feeding How should I use this medicine? This medicine is for infusion into a vein. It is given by a health care professional in a hospital or clinic setting. Talk to your pediatrician regarding the use of this medicine in children. Special care may be needed. Overdosage: If you think you have taken too much of this medicine contact a poison control center or emergency room at once. NOTE: This medicine is only for you. Do not share this medicine with others. What if I miss a dose? It is important not to miss your dose. Call your doctor or health care professional if you are unable to keep an appointment. What may interact with this medicine? Interactions are not expected. Give your health care provider a list of all the medicines, herbs, non-prescription drugs, or dietary supplements you use. Also tell them if you smoke, drink alcohol, or use illegal drugs. Some items may interact with your medicine. This list may not describe all possible interactions. Give your health care provider a list of all the medicines, herbs, non-prescription drugs, or dietary supplements you use. Also tell them if you smoke, drink alcohol, or use illegal drugs. Some items may interact with your medicine. What should I watch for while using this medicine? Your  condition will be monitored carefully while you are receiving this medicine. Report any side effects. Continue your course of treatment even though you feel ill unless your doctor tells you to stop. Do not become pregnant while taking this medicine or for 7 months after stopping it. Women should inform their doctor if they wish to become pregnant or think they might be pregnant. Women of child-bearing potential will need to have a negative pregnancy test before starting this medicine. There is a potential for serious side effects to an unborn child. Talk to your health care professional or pharmacist for more information. Do not breast-feed an infant while taking this medicine or for 7 months after stopping it. Women must use effective birth control with this medicine. Call your doctor or health care professional for advice if you get a fever, chills or sore throat, or other symptoms of a cold or flu. Do not treat yourself. Try to avoid being around people who are sick. You may experience fever, chills, and headache during the infusion. Report any side effects during the infusion to your health care professional. What side effects may I notice from receiving this medicine? Side effects that you should report to your doctor or health care professional as soon as possible: -breathing problems -chest pain or palpitations -dizziness -feeling faint or lightheaded -fever or chills -skin rash, itching or hives -sore throat -swelling of the face, lips, or tongue -swelling of the legs or ankles -unusually weak or tired Side effects that usually do not require medical attention (report to your doctor or health care professional if they continue or are bothersome): -diarrhea -hair   loss -nausea, vomiting -tiredness This list may not describe all possible side effects. Call your doctor for medical advice about side effects. You may report side effects to FDA at 1-800-FDA-1088. Where should I keep my  medicine? This drug is given in a hospital or clinic and will not be stored at home. NOTE: This sheet is a summary. It may not cover all possible information. If you have questions about this medicine, talk to your doctor, pharmacist, or health care provider.  2019 Elsevier/Gold Standard (2015-11-23 12:08:50) Trastuzumab injection for infusion What is this medicine? TRASTUZUMAB (tras TOO zoo mab) is a monoclonal antibody. It is used to treat breast cancer and stomach cancer. This medicine may be used for other purposes; ask your health care provider or pharmacist if you have questions. COMMON BRAND NAME(S): Herceptin, KANJINTI, OGIVRI What should I tell my health care provider before I take this medicine? They need to know if you have any of these conditions: -heart disease -heart failure -lung or breathing disease, like asthma -an unusual or allergic reaction to trastuzumab, benzyl alcohol, or other medications, foods, dyes, or preservatives -pregnant or trying to get pregnant -breast-feeding How should I use this medicine? This drug is given as an infusion into a vein. It is administered in a hospital or clinic by a specially trained health care professional. Talk to your pediatrician regarding the use of this medicine in children. This medicine is not approved for use in children. Overdosage: If you think you have taken too much of this medicine contact a poison control center or emergency room at once. NOTE: This medicine is only for you. Do not share this medicine with others. What if I miss a dose? It is important not to miss a dose. Call your doctor or health care professional if you are unable to keep an appointment. What may interact with this medicine? This medicine may interact with the following medications: -certain types of chemotherapy, such as daunorubicin, doxorubicin, epirubicin, and idarubicin This list may not describe all possible interactions. Give your health care  provider a list of all the medicines, herbs, non-prescription drugs, or dietary supplements you use. Also tell them if you smoke, drink alcohol, or use illegal drugs. Some items may interact with your medicine. What should I watch for while using this medicine? Visit your doctor for checks on your progress. Report any side effects. Continue your course of treatment even though you feel ill unless your doctor tells you to stop. Call your doctor or health care professional for advice if you get a fever, chills or sore throat, or other symptoms of a cold or flu. Do not treat yourself. Try to avoid being around people who are sick. You may experience fever, chills and shaking during your first infusion. These effects are usually mild and can be treated with other medicines. Report any side effects during the infusion to your health care professional. Fever and chills usually do not happen with later infusions. Do not become pregnant while taking this medicine or for 7 months after stopping it. Women should inform their doctor if they wish to become pregnant or think they might be pregnant. Women of child-bearing potential will need to have a negative pregnancy test before starting this medicine. There is a potential for serious side effects to an unborn child. Talk to your health care professional or pharmacist for more information. Do not breast-feed an infant while taking this medicine or for 7 months after stopping it. Women must use effective   birth control with this medicine. What side effects may I notice from receiving this medicine? Side effects that you should report to your doctor or health care professional as soon as possible: -allergic reactions like skin rash, itching or hives, swelling of the face, lips, or tongue -chest pain or palpitations -cough -dizziness -feeling faint or lightheaded, falls -fever -general ill feeling or flu-like symptoms -signs of worsening heart failure like  breathing problems; swelling in your legs and feet -unusually weak or tired Side effects that usually do not require medical attention (report to your doctor or health care professional if they continue or are bothersome): -bone pain -changes in taste -diarrhea -joint pain -nausea/vomiting -weight loss This list may not describe all possible side effects. Call your doctor for medical advice about side effects. You may report side effects to FDA at 1-800-FDA-1088. Where should I keep my medicine? This drug is given in a hospital or clinic and will not be stored at home. NOTE: This sheet is a summary. It may not cover all possible information. If you have questions about this medicine, talk to your doctor, pharmacist, or health care provider.  2019 Elsevier/Gold Standard (2016-10-15 14:37:52)  

## 2018-12-09 ENCOUNTER — Telehealth: Payer: Self-pay | Admitting: Hematology & Oncology

## 2018-12-09 NOTE — Telephone Encounter (Signed)
Called and LMVM for patient with date/time/location and instructions NPO after midnight and she needs to arrive @ [facility] @ 6:30 AM.  I gave her the main number for scheduling also in case she needed to reschedule

## 2018-12-09 NOTE — Progress Notes (Signed)
Hematology and Oncology Follow Up Visit  Taylor Flynn [id number] December 27, 1975 43 y.o. 12/09/2018   Principle Diagnosis:  Metastatic breast cancer-ER-/HER-2+  Current Therapy:   Herceptin/Perjeta - s/p cycle #2 (previous cycles given in [place]) Zometa 4 mg IV q 2 months due again in March   Interim History:  Taylor Flynn is here today for follow-up.  She is doing pretty well.  She really has no specific complaints..  She actually underwent biopsy and removal of the subcutaneous lesion that was on her left upper anterior abdominal wall.  This was done on 11/24/2018.  The pathology report ([id number]) revealed adenocarcinoma.  All margins were negative.  Para the tumor was ER negative and HER-2 positive.  I did send off molecular profiling on the tumor.  The tumor was PD-L1 positive.  The tumor was TMB low.  The tumor was androgen receptor positive.  We will probably need to get her set up with another PET scan or MRI so we can see how things are going for her.   Her 27.29 level was 19.7 a few weeks ago.  She has had no nausea or vomiting.  She has had no change in bowel or bladder habits.  There is been no leg swelling..  She still is doing this naturopathic therapy.  She feels that this is definitely helping her out.   Overall, her performance status is ECOG 0.    Medications:  Allergies as of 12/08/2018      Reactions   Aspirin Swelling   Angioedema    Sulfa Antibiotics Rash      Medication List       Accurate as of December 08, 2018 11:59 PM. Always use your most recent med list.        bicalutamide 50 MG tablet Commonly known as:  CASODEX Take 150 mg by mouth daily.   COQ10 PO Take 1 Dose by mouth 2 (two) times daily.   cyclophosphamide 50 MG capsule Commonly known as:  CYTOXAN Take 50 mg by mouth every evening.   EPINEPHrine 0.3 mg/0.3 mL Soaj injection Commonly known as:  EPI-PEN Inject 0.3 mg into the muscle as needed for anaphylaxis.   EUROPEAN MISTLETOE  [place] Inject 100 mg into the skin every Monday, Wednesday, and Friday.   HERCEPTIN IV Inject 1 Dose into the vein every 21 ( twenty-one) days. Combined with Perjeta   ivermectin 3 MG Tabs tablet Commonly known as:  STROMECTOL Take 6 mg by mouth daily.   methotrexate 2.5 MG tablet Commonly known as:  RHEUMATREX Take 2.5 mg by mouth 2 (two) times a week. Caution:Chemotherapy. Protect from light. Sundays and Wednesdays   NALTREXONE HCL PO Take 4.5 mg by mouth See admin instructions. Take 4.5 mg by mouth at night Wen through [place] Take 1 tablet by mouth daily. calcium microcrystalline hydroxyapatite otc supplement   OVER THE COUNTER MEDICATION Take 2-4 capsules by mouth daily. Salvestrol-  Take 4 capsules every morning and 2 capsules in the aftenroon   OVER THE COUNTER MEDICATION Take 1-2 capsules by mouth See admin instructions. lumbrokinase - Take 1 capsule in the morning 2 capsules at lunch and 1 capsule at bedtime   OVER THE COUNTER MEDICATION Take 1 Dose by mouth at bedtime. cbd oil   OVER THE COUNTER MEDICATION Take 4 capsules by mouth See admin instructions. fenbencur take 4 capsules by mouth at bedtime Sunday through Tuesday   OVER THE COUNTER MEDICATION Take 3 drops by mouth  3 (three) times daily. immunis   OVER THE COUNTER MEDICATION Take 3 drops by mouth 3 (three) times daily. Phung-ex   OVER THE COUNTER MEDICATION Take 1 Dose by mouth daily. Lymph-tone 2   OVER THE COUNTER MEDICATION Take 1 Dose by mouth daily. Pineal code   OVER THE COUNTER MEDICATION Take 30 mLs by mouth 2 (two) times daily. Haelen   PERJETA IV Inject 1 Dose into the vein every 21 ( twenty-one) days. Combined with Herceptin   Probiotic Caps Take 1 capsule by mouth every other day. With a meal   propranolol 80 MG 24 hr capsule Commonly known as:  INNOPRAN XL Take 80 mg by mouth at bedtime.   VITAMIN D-VITAMIN K PO Place 5 drops under the tongue daily.    VITAMIN K2 PO Take by mouth daily.   ZOMETA IV Inject 1 Dose into the vein every 6 (six) weeks.       Allergies:  Allergies  Allergen Reactions  . Aspirin Swelling    Angioedema   . Sulfa Antibiotics Rash    Past Medical History, Surgical history, Social history, and Family History were reviewed and updated.  Review of Systems: Review of Systems  Constitutional: Negative.   HENT: Negative.   Eyes: Negative.   Respiratory: Negative.   Cardiovascular: Negative.   Gastrointestinal: Negative.   Genitourinary: Negative.   Musculoskeletal: Negative.   Skin: Negative.   Neurological: Negative.   Endo/Heme/Allergies: Negative.   Psychiatric/Behavioral: Negative.      Physical Exam:  weight is 114 lb 6.4 oz (51.9 kg). Her oral temperature is 97.6 F (36.4 C). Her blood pressure is 115/56 (abnormal) and her pulse is 66. Her respiration is 17 and oxygen saturation is 100%.   Wt Readings from Last 3 Encounters:  12/08/18 114 lb 6.4 oz (51.9 kg)  12/03/18 113 lb 3.2 oz (51.3 kg)  11/24/18 117 lb 12 oz (53.4 kg)    Physical Exam Vitals signs reviewed.  Constitutional:      Comments: Breast exam shows right breast with no masses, edema or erythema.  There is no right axillary adenopathy.  Left chest wall shows well-healed mastectomy.  She has no left chest wall nodules.  She has no left axillary adenopathy.  HENT:     Head: Normocephalic and atraumatic.  Eyes:     Pupils: Pupils are equal, round, and reactive to light.  Neck:     Musculoskeletal: Normal range of motion.  Cardiovascular:     Rate and Rhythm: Normal rate and regular rhythm.     Heart sounds: Normal heart sounds.  Pulmonary:     Effort: Pulmonary effort is normal.     Breath sounds: Normal breath sounds.  Abdominal:     General: Bowel sounds are normal.     Palpations: Abdomen is soft.     Comments: Abdominal exam shows the healing excisional biopsy in the left upper quadrant.  There is no palpable  hepatomegaly.  There is no fluid wave.  She has no guarding or rebound tenderness.  Musculoskeletal: Normal range of motion.        General: No tenderness or deformity.  Lymphadenopathy:     Cervical: No cervical adenopathy.  Skin:    General: Skin is warm and dry.     Findings: No erythema or rash.  Neurological:     Mental Status: She is alert and oriented to person, place, and time.  Psychiatric:        Behavior: Behavior normal.          Thought Content: Thought content normal.        Judgment: Judgment normal.      Lab Results  Component Value Date   WBC 4.7 12/08/2018   HGB 9.8 (L) 12/08/2018   HCT 30.7 (L) 12/08/2018   MCV 91.9 12/08/2018   PLT 270 12/08/2018   Lab Results  Component Value Date   FERRITIN <4 (L) 11/17/2018   IRON 16 (L) 11/17/2018   TIBC 403 11/17/2018   UIBC 386 (H) 11/17/2018   IRONPCTSAT 4 (L) 11/17/2018   Lab Results  Component Value Date   RETICCTPCT 0.8 11/17/2018   RBC 3.34 (L) 12/08/2018   No results found for: KPAFRELGTCHN, LAMBDASER, KAPLAMBRATIO No results found for: IGGSERUM, IGA, IGMSERUM No results found for: TOTALPROTELP, ALBUMINELP, A1GS, A2GS, BETS, BETA2SER, GAMS, MSPIKE, SPEI   Chemistry      Component Value Date/Time   NA 138 12/08/2018 0904   K 4.0 12/08/2018 0904   CL 104 12/08/2018 0904   CO2 28 12/08/2018 0904   BUN 12 12/08/2018 0904   CREATININE 1.02 (H) 12/08/2018 0904      Component Value Date/Time   CALCIUM 9.8 12/08/2018 0904   ALKPHOS 54 12/08/2018 0904   AST 16 12/08/2018 0904   ALT 8 12/08/2018 0904   BILITOT 0.3 12/08/2018 0904       Impression and Plan: Ms. Surace is a very pleasant 42 yo African American female with metastatic breast cancer.   For right now, we will continue her on the Herceptin and Perjeta.  We will have to see what is going on with respect to her breast cancer.  I will set her up with a PET scan.  We clearly have other options that we can consider for her treatment if she  has progressive disease.  I probably would favor [facility] as her tumor is HER-2 positive.  I will plan to get her back in another 3 weeks for her next cycle of treatment.  .   Peter R Ennever, MD 2/5/20207:18 AM 

## 2018-12-17 ENCOUNTER — Encounter (HOSPITAL_COMMUNITY)
Admission: RE | Admit: 2018-12-17 | Discharge: 2018-12-17 | Disposition: A | Discharge status: Home / Self Care | Source: Ambulatory Visit | Attending: Hematology & Oncology | Admitting: Hematology & Oncology

## 2018-12-17 DIAGNOSIS — C787 Secondary malignant neoplasm of liver and intrahepatic bile duct: Secondary | ICD-10-CM | POA: Insufficient documentation

## 2018-12-17 DIAGNOSIS — C50919 Malignant neoplasm of unspecified site of unspecified female breast: Secondary | ICD-10-CM | POA: Diagnosis not present

## 2018-12-17 LAB — GLUCOSE, CAPILLARY: Glucose-Capillary: 79 mg/dL (ref 70–99)

## 2018-12-17 MED ORDER — FLUDEOXYGLUCOSE F - 18 (FDG) INJECTION
5.7000 | Freq: Once | INTRAVENOUS | Status: AC | PRN
  Administered 2018-12-17: 5.7 via INTRAVENOUS

## 2018-12-22 ENCOUNTER — Encounter: Payer: Self-pay | Admitting: *Deleted

## 2018-12-22 NOTE — Progress Notes (Signed)
I faxed a [facility] to [facility] for mastectomy supplies. Fax number is [phone].

## 2018-12-23 ENCOUNTER — Telehealth: Payer: Self-pay | Admitting: *Deleted

## 2018-12-23 NOTE — Telephone Encounter (Signed)
-----   Message from [name], MD sent at 12/17/2018 10:42 AM EST ----- Call - NO active breast cancer seen on the PET scan!!!  [facility]

## 2018-12-23 NOTE — Telephone Encounter (Signed)
As noted below by Dr. Marin Olp, I informed the patient that there was NO active breast cancer seen on the PET scan. She verbalized understanding.

## 2018-12-29 ENCOUNTER — Inpatient Hospital Stay

## 2018-12-29 ENCOUNTER — Other Ambulatory Visit: Payer: Self-pay

## 2018-12-29 ENCOUNTER — Telehealth: Payer: Self-pay | Admitting: Obstetrics & Gynecology

## 2018-12-29 ENCOUNTER — Inpatient Hospital Stay (HOSPITAL_BASED_OUTPATIENT_CLINIC_OR_DEPARTMENT_OTHER): Admitting: Hematology & Oncology

## 2018-12-29 ENCOUNTER — Encounter: Payer: Self-pay | Admitting: Hematology & Oncology

## 2018-12-29 VITALS — BP 107/51 | HR 72 | Temp 98.2°F | Resp 18 | Wt 120.0 lb

## 2018-12-29 DIAGNOSIS — C792 Secondary malignant neoplasm of skin: Secondary | ICD-10-CM

## 2018-12-29 DIAGNOSIS — C773 Secondary and unspecified malignant neoplasm of axilla and upper limb lymph nodes: Secondary | ICD-10-CM

## 2018-12-29 DIAGNOSIS — C50912 Malignant neoplasm of unspecified site of left female breast: Secondary | ICD-10-CM

## 2018-12-29 DIAGNOSIS — Z5112 Encounter for antineoplastic immunotherapy: Secondary | ICD-10-CM | POA: Diagnosis not present

## 2018-12-29 DIAGNOSIS — Z171 Estrogen receptor negative status [ER-]: Secondary | ICD-10-CM

## 2018-12-29 DIAGNOSIS — C787 Secondary malignant neoplasm of liver and intrahepatic bile duct: Secondary | ICD-10-CM | POA: Diagnosis not present

## 2018-12-29 DIAGNOSIS — C50919 Malignant neoplasm of unspecified site of unspecified female breast: Secondary | ICD-10-CM

## 2018-12-29 DIAGNOSIS — Z79899 Other long term (current) drug therapy: Secondary | ICD-10-CM

## 2018-12-29 LAB — CBC WITH DIFFERENTIAL (CANCER CENTER ONLY)
Abs Immature Granulocytes: 0.01 10*3/uL (ref 0.00–0.07)
Basophils Absolute: 0 10*3/uL (ref 0.0–0.1)
Basophils Relative: 1 %
Eosinophils Absolute: 0.1 10*3/uL (ref 0.0–0.5)
Eosinophils Relative: 2 %
HCT: 29.6 % — ABNORMAL LOW (ref 36.0–46.0)
Hemoglobin: 9.4 g/dL — ABNORMAL LOW (ref 12.0–15.0)
Immature Granulocytes: 0 %
Lymphocytes Relative: 24 %
Lymphs Abs: 1 10*3/uL (ref 0.7–4.0)
MCH: 28.7 pg (ref 26.0–34.0)
MCHC: 31.8 g/dL (ref 30.0–36.0)
MCV: 90.5 fL (ref 80.0–100.0)
Monocytes Absolute: 0.4 10*3/uL (ref 0.1–1.0)
Monocytes Relative: 9 %
Neutro Abs: 2.7 10*3/uL (ref 1.7–7.7)
Neutrophils Relative %: 64 %
Platelet Count: 246 10*3/uL (ref 150–400)
RBC: 3.27 MIL/uL — ABNORMAL LOW (ref 3.87–5.11)
RDW: 13.7 % (ref 11.5–15.5)
WBC Count: 4.1 10*3/uL (ref 4.0–10.5)
nRBC: 0 % (ref 0.0–0.2)

## 2018-12-29 LAB — CMP (CANCER CENTER ONLY)
ALT: 9 U/L (ref 0–44)
AST: 17 U/L (ref 15–41)
Albumin: 4.2 g/dL (ref 3.5–5.0)
Alkaline Phosphatase: 52 U/L (ref 38–126)
Anion gap: 7 (ref 5–15)
BUN: 15 mg/dL (ref 6–20)
CO2: 27 mmol/L (ref 22–32)
Calcium: 9.3 mg/dL (ref 8.9–10.3)
Chloride: 105 mmol/L (ref 98–111)
Creatinine: 1.03 mg/dL — ABNORMAL HIGH (ref 0.44–1.00)
GFR, Est AFR Am: >60 mL/min (ref >60)
GFR, Estimated: >60 mL/min (ref >60)
Glucose, Bld: 94 mg/dL (ref 70–99)
Potassium: 4.1 mmol/L (ref 3.5–5.1)
Sodium: 139 mmol/L (ref 135–145)
Total Bilirubin: 0.3 mg/dL (ref 0.3–1.2)
Total Protein: 6.1 g/dL — ABNORMAL LOW (ref 6.5–8.1)

## 2018-12-29 LAB — IRON AND TIBC
Iron: 30 ug/dL — ABNORMAL LOW (ref 41–142)
Saturation Ratios: 7 % — ABNORMAL LOW (ref 21–57)
TIBC: 431 ug/dL (ref 236–444)
UIBC: 401 ug/dL — ABNORMAL HIGH (ref 120–384)

## 2018-12-29 LAB — FERRITIN: Ferritin: 4 ng/mL — ABNORMAL LOW (ref 11–307)

## 2018-12-29 MED ORDER — HEPARIN SOD (PORK) LOCK FLUSH 100 UNIT/ML IV SOLN
500.0000 [IU] | Freq: Once | INTRAVENOUS | Status: AC | PRN
  Administered 2018-12-29: 500 [IU]
  Filled 2018-12-29: qty 5

## 2018-12-29 MED ORDER — SODIUM CHLORIDE 0.9 % IV SOLN
Freq: Once | INTRAVENOUS | Status: AC
  Administered 2018-12-29: 10:00:00 via INTRAVENOUS
  Filled 2018-12-29: qty 250

## 2018-12-29 MED ORDER — SODIUM CHLORIDE 0.9 % IV SOLN
420.0000 mg | Freq: Once | INTRAVENOUS | Status: AC
  Administered 2018-12-29: 420 mg via INTRAVENOUS
  Filled 2018-12-29: qty 14

## 2018-12-29 MED ORDER — TRASTUZUMAB CHEMO 150 MG IV SOLR
300.0000 mg | Freq: Once | INTRAVENOUS | Status: AC
  Administered 2018-12-29: 300 mg via INTRAVENOUS
  Filled 2018-12-29: qty 14.29

## 2018-12-29 MED ORDER — SODIUM CHLORIDE 0.9% FLUSH
10.0000 mL | INTRAVENOUS | Status: DC | PRN
  Administered 2018-12-29: 10 mL
  Filled 2018-12-29: qty 10

## 2018-12-29 NOTE — Progress Notes (Signed)
Hematology and Oncology Follow Up Visit  Taylor Flynn [id number] [birth year] 43 y.o. 12/29/2018   Principle Diagnosis:  Metastatic breast cancer-ER-/HER-2+  Current Therapy:   Herceptin/Perjeta - s/p cycle #3 (previous cycles given in [place]) Zometa 4 mg IV q 2 months due again in March   Interim History:  Taylor Flynn is here today for follow-up.  She is feeling pretty well.  She is taking quite a few complementary treatments.  She sees to alternative medicine doctors.  She is on Cytoxan.  She is on methotrexate.  These are both low-dose.  She does not want IV iron.  Her ferritin is 4 and her iron saturation is less than 4%.  She reads that IV iron causes cancer to grow.  She will not take IV iron.  We did go ahead and get do a PET scan on her.  This was done on 12/17/2018.  The PET scan did not show any active areas of breast cancer.  She has some sclerotic lesions in the skeleton but these were not metabolic.  Nothing was in her liver.  Her last CA 27.29 was 19.7.  She is still very active.  She exercises.  She is doing yoga.  She is doing dancing.  She has had no issues with her bowels or bladder.  There is no cough or shortness of breath.  Overall, her performance status is ECOG 1.    Medications:  Allergies as of 12/29/2018      Reactions   Aspirin Swelling   Angioedema    Sulfa Antibiotics Rash      Medication List       Accurate as of December 29, 2018  9:45 AM. Always use your most recent med list.        bicalutamide 50 MG tablet Commonly known as:  CASODEX Take 150 mg by mouth daily.   COQ10 PO Take 1 Dose by mouth 2 (two) times daily.   cyclophosphamide 50 MG capsule Commonly known as:  CYTOXAN Take 50 mg by mouth every evening.   desloratadine 5 MG tablet Commonly known as:  CLARINEX Take 5 mg by mouth daily as needed.   EPINEPHrine 0.3 mg/0.3 mL Soaj injection Commonly known as:  EPI-PEN Inject 0.3 mg into the muscle as needed for anaphylaxis.     EUROPEAN MISTLETOE [place] Inject 100 mg into the skin every Monday, Wednesday, and Friday.   HERCEPTIN IV Inject 1 Dose into the vein every 21 ( twenty-one) days. Combined with Perjeta   ivermectin 3 MG Tabs tablet Commonly known as:  STROMECTOL Take 6 mg by mouth daily.   methotrexate 2.5 MG tablet Commonly known as:  RHEUMATREX Take 2.5 mg by mouth 2 (two) times a week. Caution:Chemotherapy. Protect from light. Sundays and Wednesdays   NALTREXONE HCL PO Take 4.5 mg by mouth See admin instructions. Take 4.5 mg by mouth at night Wen through [place] Take 1 tablet by mouth daily. calcium microcrystalline hydroxyapatite otc supplement   OVER THE COUNTER MEDICATION Take 2-4 capsules by mouth daily. Salvestrol-  Take 4 capsules every morning and 2 capsules in the aftenroon   OVER THE COUNTER MEDICATION Take 1-2 capsules by mouth See admin instructions. lumbrokinase - Take 1 capsule in the morning 2 capsules at lunch and 1 capsule at bedtime   OVER THE COUNTER MEDICATION Take 1 Dose by mouth at bedtime. cbd oil   OVER THE COUNTER MEDICATION Take 4 capsules by mouth See admin instructions. fenbencur  take 4 capsules by mouth at bedtime Sunday through Tuesday   OVER THE COUNTER MEDICATION Take 3 drops by mouth 3 (three) times daily. immunis   OVER THE COUNTER MEDICATION Take 3 drops by mouth 3 (three) times daily. Phung-ex   OVER THE COUNTER MEDICATION Take 1 Dose by mouth daily. Lymph-tone 2   OVER THE COUNTER MEDICATION Take 1 Dose by mouth daily. Pineal code   OVER THE COUNTER MEDICATION Take 30 mLs by mouth 2 (two) times daily. Haelen   PERJETA IV Inject 1 Dose into the vein every 21 ( twenty-one) days. Combined with Herceptin   Probiotic Caps Take 2 capsules by mouth every other day. Daily with a meal   VITAMIN D-VITAMIN K PO Place 5 drops under the tongue daily.   ZOMETA IV Inject 1 Dose into the vein every 6 (six) weeks.        Allergies:  Allergies  Allergen Reactions  . Aspirin Swelling    Angioedema   . Sulfa Antibiotics Rash    Past Medical History, Surgical history, Social history, and Family History were reviewed and updated.  Review of Systems: Review of Systems  Constitutional: Negative.   HENT: Negative.   Eyes: Negative.   Respiratory: Negative.   Cardiovascular: Negative.   Gastrointestinal: Negative.   Genitourinary: Negative.   Musculoskeletal: Negative.   Skin: Negative.   Neurological: Negative.   Endo/Heme/Allergies: Negative.   Psychiatric/Behavioral: Negative.      Physical Exam:  weight is 120 lb (54.4 kg). Her oral temperature is 98.2 F (36.8 C). Her blood pressure is 107/51 (abnormal) and her pulse is 72. Her respiration is 18 and oxygen saturation is 100%.   Wt Readings from Last 3 Encounters:  12/29/18 120 lb (54.4 kg)  12/08/18 114 lb 6.4 oz (51.9 kg)  12/03/18 113 lb 3.2 oz (51.3 kg)    Physical Exam Vitals signs reviewed.  Constitutional:      Comments: Breast exam shows right breast with no masses, edema or erythema.  There is no right axillary adenopathy.  Left chest wall shows well-healed mastectomy.  She has no left chest wall nodules.  She has no left axillary adenopathy.  HENT:     Head: Normocephalic and atraumatic.  Eyes:     Pupils: Pupils are equal, round, and reactive to light.  Neck:     Musculoskeletal: Normal range of motion.  Cardiovascular:     Rate and Rhythm: Normal rate and regular rhythm.     Heart sounds: Normal heart sounds.  Pulmonary:     Effort: Pulmonary effort is normal.     Breath sounds: Normal breath sounds.  Abdominal:     General: Bowel sounds are normal.     Palpations: Abdomen is soft.     Comments: Abdominal exam shows the healing excisional biopsy in the left upper quadrant.  There is no palpable hepatomegaly.  There is no fluid wave.  She has no guarding or rebound tenderness.  Musculoskeletal: Normal range of  motion.        General: No tenderness or deformity.  Lymphadenopathy:     Cervical: No cervical adenopathy.  Skin:    General: Skin is warm and dry.     Findings: No erythema or rash.  Neurological:     Mental Status: She is alert and oriented to person, place, and time.  Psychiatric:        Behavior: Behavior normal.        Thought Content: Thought content normal.  Judgment: Judgment normal.      Lab Results  Component Value Date   WBC 4.1 12/29/2018   HGB 9.4 (L) 12/29/2018   HCT 29.6 (L) 12/29/2018   MCV 90.5 12/29/2018   PLT 246 12/29/2018   Lab Results  Component Value Date   FERRITIN <4 (L) 11/17/2018   IRON 16 (L) 11/17/2018   TIBC 403 11/17/2018   UIBC 386 (H) 11/17/2018   IRONPCTSAT 4 (L) 11/17/2018   Lab Results  Component Value Date   RETICCTPCT 0.8 11/17/2018   RBC 3.27 (L) 12/29/2018   No results found for: KPAFRELGTCHN, LAMBDASER, KAPLAMBRATIO No results found for: IGGSERUM, IGA, IGMSERUM No results found for: [place]det[place] Pink, SPEI   Chemistry      Component Value Date/Time   NA 139 12/29/2018 0850   K 4.1 12/29/2018 0850   CL 105 12/29/2018 0850   CO2 27 12/29/2018 0850   BUN 15 12/29/2018 0850   CREATININE 1.03 (H) 12/29/2018 0850      Component Value Date/Time   CALCIUM 9.3 12/29/2018 0850   ALKPHOS 52 12/29/2018 0850   AST 17 12/29/2018 0850   ALT 9 12/29/2018 0850   BILITOT 0.3 12/29/2018 0850       Impression and Plan: Ms. Yankovich is a very pleasant 43 yo African American female with metastatic breast cancer.   For right now, we will continue her on the Herceptin and Perjeta.  I do not see any reason to make a change with her protocol right now.  We will plan to get her back in another 3 weeks.  [name], MD 2/25/20209:45 AM

## 2018-12-29 NOTE — Progress Notes (Signed)
Patient refused ANY Tylenol or Benadryl " I never take that, ever". Explained rationale behind it and she still declined.

## 2018-12-29 NOTE — Telephone Encounter (Signed)
Left message to call Sharee Pimple, RN at [facility]. Advised office phones go off at 4:30pm, return on at 8am tomorrow morning.

## 2018-12-29 NOTE — Patient Instructions (Signed)
[facility] Discharge Instructions for Patients Receiving Chemotherapy  Today you received the following chemotherapy agents Herceptin, Perjeta  To help prevent nausea and vomiting after your treatment, we encourage you to take your nausea medication    If you develop nausea and vomiting that is not controlled by your nausea medication, call the clinic.   BELOW ARE SYMPTOMS THAT SHOULD BE REPORTED IMMEDIATELY:  *FEVER GREATER THAN 100.5 F  *CHILLS WITH OR WITHOUT FEVER  NAUSEA AND VOMITING THAT IS NOT CONTROLLED WITH YOUR NAUSEA MEDICATION  *UNUSUAL SHORTNESS OF BREATH  *UNUSUAL BRUISING OR BLEEDING  TENDERNESS IN MOUTH AND THROAT WITH OR WITHOUT PRESENCE OF ULCERS  *URINARY PROBLEMS  *BOWEL PROBLEMS  UNUSUAL RASH Items with * indicate a potential emergency and should be followed up as soon as possible.  Feel free to call the clinic should you have any questions or concerns. The clinic phone number is [phone].  Please show the CHEMO ALERT CARD at check-in to the Emergency Department and triage nurse.   

## 2018-12-29 NOTE — Telephone Encounter (Signed)
Patient is calling about a referral for PCP and lymphedema clinic. She states she has an appointment somewhere tomorrow but not sure what office. She did not write down the information when she was called. There is no referral on file for PCP or lymphedema clinic from [facility]. Patient wants to speak with Dr. Sabra Heck about this referral.

## 2018-12-30 ENCOUNTER — Telehealth: Payer: Self-pay | Admitting: *Deleted

## 2018-12-30 ENCOUNTER — Other Ambulatory Visit: Payer: Self-pay | Admitting: Family Medicine

## 2018-12-30 ENCOUNTER — Other Ambulatory Visit (HOSPITAL_COMMUNITY): Payer: Self-pay | Admitting: Family Medicine

## 2018-12-30 ENCOUNTER — Other Ambulatory Visit: Payer: Self-pay | Admitting: Obstetrics & Gynecology

## 2018-12-30 DIAGNOSIS — C50919 Malignant neoplasm of unspecified site of unspecified female breast: Secondary | ICD-10-CM

## 2018-12-30 DIAGNOSIS — C50412 Malignant neoplasm of upper-outer quadrant of left female breast: Secondary | ICD-10-CM

## 2018-12-30 DIAGNOSIS — Z170 Estrogen receptor positive status [ER+]: Secondary | ICD-10-CM

## 2018-12-30 DIAGNOSIS — I890 Lymphedema, not elsewhere classified: Secondary | ICD-10-CM

## 2018-12-30 DIAGNOSIS — R140 Abdominal distension (gaseous): Secondary | ICD-10-CM

## 2018-12-30 LAB — CANCER ANTIGEN 27.29: CA 27.29: 15.2 U/mL (ref 0.0–38.6)

## 2018-12-30 NOTE — Telephone Encounter (Signed)
-----   Message from [name], MD sent at 12/29/2018 12:53 PM EST ----- Call - the iron is still very low!!  Laurey Arrow

## 2018-12-30 NOTE — Telephone Encounter (Signed)
Also forwarding to Glorianne Manchester, RN, to see if can call about appt as well.

## 2018-12-30 NOTE — Telephone Encounter (Signed)
I think pt was given this information but referral placed.  She has breast cancer and lymphedema in her left arm.  She needs to be seen at the [facility].  This is the information:  Taylor Flynn ([place]) [phone]

## 2018-12-30 NOTE — Telephone Encounter (Signed)
As noted below by Dr. Marin Olp, I tried calling the patient to let her know that her iron level is still low. Was not able to leave a message on her phone. Will continue to try calling.

## 2018-12-31 NOTE — Telephone Encounter (Signed)
Left message to call Sharee Pimple, RN at [facility].   If I am not available, may speak with [facility] regarding referral.    Per review of Epic, patient is scheduled at [place], [place],  [place]  [address] on 01/08/19 at 8:45am.  [phone].

## 2018-12-31 NOTE — Telephone Encounter (Signed)
Patient returned call to nurse Jill. °

## 2018-12-31 NOTE — Telephone Encounter (Signed)
Spoke with patient, advised of appointment as seen below per Dr. Sabra Heck. Patient agreeable to date and time.   Patient states she discussed with Dr. Sabra Heck establishing care with PCP.  Patient lives close to [place] [address], [place],  [place]  [address].  Patient states Dr. Sabra Heck made a recommendation, is unsure of provider. Advised I will review with Dr. Sabra Heck and return call with recommendations.   Dr. Sabra Heck -please advise on PCP.

## 2019-01-02 ENCOUNTER — Ambulatory Visit (HOSPITAL_COMMUNITY)
Admission: RE | Admit: 2019-01-02 | Discharge: 2019-01-02 | Disposition: A | Discharge status: Home / Self Care | Source: Ambulatory Visit | Attending: Family Medicine | Admitting: Family Medicine

## 2019-01-02 DIAGNOSIS — R140 Abdominal distension (gaseous): Secondary | ICD-10-CM | POA: Diagnosis not present

## 2019-01-02 DIAGNOSIS — C50919 Malignant neoplasm of unspecified site of unspecified female breast: Secondary | ICD-10-CM | POA: Diagnosis present

## 2019-01-02 MED ORDER — GADOBUTROL 1 MMOL/ML IV SOLN
6.0000 mL | Freq: Once | INTRAVENOUS | Status: AC | PRN
  Administered 2019-01-02: 6 mL via INTRAVENOUS

## 2019-01-04 NOTE — Telephone Encounter (Signed)
We talked about the [facility] at [facility] office but they do not see pediatric patients if she wants to entire family to be seen at one location.  Dr. Ria Bush was the provider we specifically discussed.

## 2019-01-04 NOTE — Telephone Encounter (Signed)
Left detailed message, ok per dpr. Advised as seen below per Dr. Sabra Heck. Advised may contact their office directly to schedule, return call to [facility] if additional assistance is needed.   Encounter closed.

## 2019-01-08 ENCOUNTER — Encounter: Payer: Self-pay | Admitting: Physical Therapy

## 2019-01-08 ENCOUNTER — Ambulatory Visit: Attending: Obstetrics & Gynecology | Admitting: Physical Therapy

## 2019-01-08 DIAGNOSIS — R293 Abnormal posture: Secondary | ICD-10-CM

## 2019-01-08 DIAGNOSIS — M25612 Stiffness of left shoulder, not elsewhere classified: Secondary | ICD-10-CM | POA: Diagnosis present

## 2019-01-08 DIAGNOSIS — I890 Lymphedema, not elsewhere classified: Secondary | ICD-10-CM | POA: Diagnosis present

## 2019-01-08 NOTE — Therapy (Signed)
[place], [place], [address] Phone: [phone]   Fax:  [phone]  Physical Therapy Evaluation  Patient Details  Name: Taylor Flynn MRN: [id number] Date of Birth: [birth year] Referring Provider (PT): Hale Bogus   Encounter Date: 01/08/2019  PT End of Session - 01/08/19 1207    Visit Number  1    Number of Visits  9    Date for PT Re-Evaluation  02/05/19    PT Start Time  0848    PT Stop Time  0925    PT Time Calculation (min)  37 min    Activity Tolerance  Patient tolerated treatment well    Behavior During Therapy  [facility] for tasks assessed/performed       Past Medical History:  Diagnosis Date  . Breast cancer metastasized to liver, unspecified laterality ([place]) 10/08/2018  . 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, left ([place]) 10/08/2018  . Narcolepsy   . Scoliosis    MILD  . Urge incontinence     Past Surgical History:  Procedure Laterality Date  . BREAST BIOPSY Left 01/2016  . CESAREAN SECTION  2012  . HERNIA REPAIR     ? Inguinal, but patient unsure.  Age 11  . MASS EXCISION Left 11/24/2018   Procedure: EXCISION NODULE LEFT CHEST WALL ERAS PATHWAY;  Surgeon: Fanny Skates, MD;  Location: [facility];  Service: General;  Laterality: Left;  . SENTINEL NODE BIOPSY Left 2017 APRIL    There were no vitals filed for this visit.   Subjective Assessment - 01/08/19 0853    Subjective  I have been having chronic swelling in my left upper arm since the cancer metastasized. I am also having swelling in my feet and legs that has been there to but it is not bad.     Pertinent History  left breast cancer metastasized to the liver, skin, and bone April 2017 metastasized Dec 2019, pt completed chemotherapy and is currently taking Herceptin and Projeta    Patient Stated Goals  to get the swelling down, improve lymphatic flow    Currently in Flynn?  No/denies    Flynn Score  0-No Flynn         [facility] PT Assessment  - 01/08/19 0001      Assessment   Medical Diagnosis  left breast cancer    Referring Provider (PT)  Hale Bogus    Onset Date/Surgical Date  02/03/16    Hand Dominance  Right    Prior Therapy  in 06/2018 lymphedema therapy at another facility      Precautions   Precautions  Other (comment)    Precaution Comments  active cancer      Restrictions   Weight Bearing Restrictions  No      Balance Screen   Has the patient fallen in the past 6 months  No    Has the patient had a decrease in activity level because of a fear of falling?   No    Is the patient reluctant to leave their home because of a fear of falling?   No      Home Environment   Living Environment  Private residence    Living Arrangements  Other relatives   lives with brother and daughter   Available Help at Discharge  Family      Prior Function   Level of Independence  Independent    Vocation  Part time employment   telepyschiatry   Vocation Requirements  psychiatry- desk work    Leisure  yoga, qi gong, self rakki, dance, walk, starting to swim      Cognition   Overall Cognitive Status  Within Functional Limits for tasks assessed      ROM / Strength   AROM / PROM / Strength  AROM      AROM   AROM Assessment Site  Shoulder    Right/Left Shoulder  Right;Left    Right Shoulder Flexion  160 Degrees    Right Shoulder ABduction  165 Degrees    Right Shoulder Internal Rotation  60 Degrees    Right Shoulder External Rotation  90 Degrees    Left Shoulder Flexion  159 Degrees    Left Shoulder ABduction  157 Degrees    Left Shoulder Internal Rotation  64 Degrees    Left Shoulder External Rotation  85 Degrees        LYMPHEDEMA/ONCOLOGY QUESTIONNAIRE - 01/08/19 0913      Type   Cancer Type  metastatic left breast cancer      Treatment   Active Chemotherapy Treatment  No    Past Chemotherapy Treatment  Yes      What other symptoms do you have   Are you Having Heaviness or Tightness  Yes    Are you having  Flynn  Yes    Are you having pitting edema  No    Is it Hard or Difficult finding clothes that fit  No    Do you have infections  No    Is there Decreased scar mobility  No      Lymphedema Assessments   Lymphedema Assessments  Upper extremities      Right Upper Extremity Lymphedema   15 cm Proximal to Olecranon Process  24.5 cm    Olecranon Process  23 cm    15 cm Proximal to Ulnar Styloid Process  22 cm    Just Proximal to Ulnar Styloid Process  14.9 cm    Across Hand at [employer]  19.6 cm    At [place] of 2nd Digit  5.7 cm      Left Upper Extremity Lymphedema   15 cm Proximal to Olecranon Process  26.5 cm    Olecranon Process  23.5 cm    15 cm Proximal to Ulnar Styloid Process  21.1 cm    Just Proximal to Ulnar Styloid Process  14.8 cm    Across Hand at [employer]  18.9 cm    At [place] of 2nd Digit  5.3 cm             Objective measurements completed on examination: See above findings.                   PT Long Term Goals - 01/08/19 1216      PT LONG TERM GOAL #1   Title  Pt will obtain appropriate compression sleeve and gauntlet for long term management of lymphedema    Time  4    Period  Weeks    Status  New    Target Date  02/05/19      PT LONG TERM GOAL #2   Title  Pt will be independent in a home exercise program to increase end range L shoulder ROM    Time  4    Period  Weeks    Status  New    Target Date  02/05/19      PT LONG TERM GOAL #3  Title  Pt will be independent in self MLD for long term management of lymphedema    Time  4    Period  Weeks    Status  New    Target Date  02/05/19             Plan - 01/08/19 1209    Clinical Impression Statement  Pt presents to PT with LUE lymphedema that is mild as well as tightness at end range of left shoulder ROM. She was diagnosed with metastatic breast cancer and possible had tumor blockage in left axilla that triggered her swelling. Recent scans show no evidence of tumor in  this area. She has not had any surgeries or any lymph nodes removed. Pt feels the fullness in her upper arm though there is only a 2cm difference in circumference between her L and R UE. Pt would benefit from skilled PT services to teach pt self MLD, assist pt with obtaining compression sleeve and gauntlet, and decrease tightness at end range in left shoulder.     Personal Factors and Comorbidities  Comorbidity 1    Comorbidities  metastatic cancer    Examination-Activity Limitations  Reach Overhead    Stability/Clinical Decision Making  Stable/Uncomplicated    Clinical Decision Making  Low    Rehab Potential  Excellent    PT Frequency  2x / week    PT Duration  4 weeks    PT Treatment/Interventions  ADLs/Self Care Home Management;Therapeutic activities;Therapeutic exercise;Manual techniques;Manual lymph drainage;Compression bandaging;Passive range of motion;Vasopneumatic Device;Taping    PT Next Visit Plan  teach pt self MLD, assist pt with getting compression sleeve and gauntlet, give pt ROM exercises for end range L shoulder ROM    Consulted and Agree with Plan of Care  Patient       Patient will benefit from skilled therapeutic intervention in order to improve the following deficits and impairments:  Increased edema, Decreased range of motion, Postural dysfunction, Increased fascial restricitons  Visit Diagnosis: Lymphedema, not elsewhere classified  Stiffness of left shoulder, not elsewhere classified  Abnormal posture     Problem List Patient Active Problem List   Diagnosis Date Noted  . Breast cancer metastasized to liver, unspecified laterality ([place]) 10/08/2018  . 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 Miscarriage 32/12/2017  . Multigravida of advanced maternal age 32/12/2017  . Pruritus of vagina 10/05/2018  . 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 Angio-edema 09/25/2018  . Malignant neoplasm of overlapping sites of left breast in female,  estrogen receptor negative ([place]) 10/21/2017    Allyson Sabal [facility] 01/08/2019, 12:17 PM  [place], [place], [address] Phone: [phone]   Fax:  [phone]  Name: Taylor Flynn MRN: [id number] Date of Birth: 06-11-1976  Manus Gunning, PT 01/08/19 12:18 PM

## 2019-01-14 ENCOUNTER — Ambulatory Visit: Admitting: Physical Therapy

## 2019-01-19 ENCOUNTER — Encounter: Payer: Self-pay | Admitting: Hematology & Oncology

## 2019-01-19 ENCOUNTER — Other Ambulatory Visit: Payer: Self-pay

## 2019-01-19 ENCOUNTER — Inpatient Hospital Stay: Attending: Hematology & Oncology | Admitting: Hematology & Oncology

## 2019-01-19 ENCOUNTER — Inpatient Hospital Stay

## 2019-01-19 VITALS — BP 107/63 | HR 67 | Temp 98.0°F | Resp 16 | Wt 119.0 lb

## 2019-01-19 DIAGNOSIS — Z79899 Other long term (current) drug therapy: Secondary | ICD-10-CM | POA: Diagnosis not present

## 2019-01-19 DIAGNOSIS — C787 Secondary malignant neoplasm of liver and intrahepatic bile duct: Secondary | ICD-10-CM | POA: Diagnosis not present

## 2019-01-19 DIAGNOSIS — C773 Secondary and unspecified malignant neoplasm of axilla and upper limb lymph nodes: Secondary | ICD-10-CM | POA: Insufficient documentation

## 2019-01-19 DIAGNOSIS — C50919 Malignant neoplasm of unspecified site of unspecified female breast: Secondary | ICD-10-CM

## 2019-01-19 DIAGNOSIS — Z171 Estrogen receptor negative status [ER-]: Secondary | ICD-10-CM | POA: Diagnosis not present

## 2019-01-19 DIAGNOSIS — Z5112 Encounter for antineoplastic immunotherapy: Secondary | ICD-10-CM | POA: Insufficient documentation

## 2019-01-19 DIAGNOSIS — C50912 Malignant neoplasm of unspecified site of left female breast: Secondary | ICD-10-CM

## 2019-01-19 DIAGNOSIS — C50812 Malignant neoplasm of overlapping sites of left female breast: Secondary | ICD-10-CM

## 2019-01-19 DIAGNOSIS — C792 Secondary malignant neoplasm of skin: Secondary | ICD-10-CM

## 2019-01-19 LAB — CBC WITH DIFFERENTIAL (CANCER CENTER ONLY)
Abs Immature Granulocytes: 0.01 10*3/uL (ref 0.00–0.07)
Basophils Absolute: 0 10*3/uL (ref 0.0–0.1)
Basophils Relative: 0 %
Eosinophils Absolute: 0.1 10*3/uL (ref 0.0–0.5)
Eosinophils Relative: 3 %
HCT: 29.7 % — ABNORMAL LOW (ref 36.0–46.0)
Hemoglobin: 9.4 g/dL — ABNORMAL LOW (ref 12.0–15.0)
Immature Granulocytes: 0 %
Lymphocytes Relative: 28 %
Lymphs Abs: 1.3 10*3/uL (ref 0.7–4.0)
MCH: 28.3 pg (ref 26.0–34.0)
MCHC: 31.6 g/dL (ref 30.0–36.0)
MCV: 89.5 fL (ref 80.0–100.0)
Monocytes Absolute: 0.5 10*3/uL (ref 0.1–1.0)
Monocytes Relative: 10 %
Neutro Abs: 2.7 10*3/uL (ref 1.7–7.7)
Neutrophils Relative %: 59 %
Platelet Count: 248 10*3/uL (ref 150–400)
RBC: 3.32 MIL/uL — ABNORMAL LOW (ref 3.87–5.11)
RDW: 14.3 % (ref 11.5–15.5)
WBC Count: 4.6 10*3/uL (ref 4.0–10.5)
nRBC: 0 % (ref 0.0–0.2)

## 2019-01-19 LAB — CMP (CANCER CENTER ONLY)
ALT: 8 U/L (ref 0–44)
AST: 15 U/L (ref 15–41)
Albumin: 4.3 g/dL (ref 3.5–5.0)
Alkaline Phosphatase: 58 U/L (ref 38–126)
Anion gap: 7 (ref 5–15)
BUN: 17 mg/dL (ref 6–20)
CO2: 27 mmol/L (ref 22–32)
Calcium: 8.9 mg/dL (ref 8.9–10.3)
Chloride: 105 mmol/L (ref 98–111)
Creatinine: 1.05 mg/dL — ABNORMAL HIGH (ref 0.44–1.00)
GFR, Est AFR Am: >60 mL/min (ref >60)
GFR, Estimated: >60 mL/min (ref >60)
Glucose, Bld: 87 mg/dL (ref 70–99)
Potassium: 3.9 mmol/L (ref 3.5–5.1)
Sodium: 139 mmol/L (ref 135–145)
Total Bilirubin: 0.3 mg/dL (ref 0.3–1.2)
Total Protein: 6.7 g/dL (ref 6.5–8.1)

## 2019-01-19 LAB — FERRITIN: Ferritin: <4 ng/mL — ABNORMAL LOW (ref 11–307)

## 2019-01-19 LAB — IRON AND TIBC
Iron: 28 ug/dL — ABNORMAL LOW (ref 41–142)
Saturation Ratios: 6 % — ABNORMAL LOW (ref 21–57)
TIBC: 448 ug/dL — ABNORMAL HIGH (ref 236–444)
UIBC: 419 ug/dL — ABNORMAL HIGH (ref 120–384)

## 2019-01-19 MED ORDER — HEPARIN SOD (PORK) LOCK FLUSH 100 UNIT/ML IV SOLN
500.0000 [IU] | Freq: Once | INTRAVENOUS | Status: AC | PRN
  Administered 2019-01-19: 500 [IU]
  Filled 2019-01-19: qty 5

## 2019-01-19 MED ORDER — DIPHENHYDRAMINE HCL 25 MG PO CAPS: 50.0000 mg | ORAL_CAPSULE | Freq: Once | ORAL | Status: DC

## 2019-01-19 MED ORDER — DIPHENHYDRAMINE HCL 25 MG PO CAPS
ORAL_CAPSULE | ORAL | Status: AC
  Filled 2019-01-19: qty 2

## 2019-01-19 MED ORDER — SODIUM CHLORIDE 0.9 % IV SOLN
Freq: Once | INTRAVENOUS | Status: DC
  Filled 2019-01-19: qty 250

## 2019-01-19 MED ORDER — SODIUM CHLORIDE 0.9% FLUSH
10.0000 mL | INTRAVENOUS | Status: DC | PRN
  Administered 2019-01-19: 10 mL
  Filled 2019-01-19: qty 10

## 2019-01-19 MED ORDER — SODIUM CHLORIDE 0.9 % IV SOLN
Freq: Once | INTRAVENOUS | Status: AC
  Administered 2019-01-19: 11:00:00 via INTRAVENOUS
  Filled 2019-01-19: qty 250

## 2019-01-19 MED ORDER — SODIUM CHLORIDE 0.9 % IV SOLN
420.0000 mg | Freq: Once | INTRAVENOUS | Status: AC
  Administered 2019-01-19: 420 mg via INTRAVENOUS
  Filled 2019-01-19: qty 14

## 2019-01-19 MED ORDER — ACETAMINOPHEN 325 MG PO TABS: 650.0000 mg | ORAL_TABLET | Freq: Once | ORAL | Status: DC

## 2019-01-19 MED ORDER — ACETAMINOPHEN 325 MG PO TABS
ORAL_TABLET | ORAL | Status: AC
  Filled 2019-01-19: qty 2

## 2019-01-19 MED ORDER — ZOLEDRONIC ACID 4 MG/100ML IV SOLN
4.0000 mg | Freq: Once | INTRAVENOUS | Status: AC
  Administered 2019-01-19: 4 mg via INTRAVENOUS
  Filled 2019-01-19: qty 100

## 2019-01-19 MED ORDER — TRASTUZUMAB CHEMO 150 MG IV SOLR
300.0000 mg | Freq: Once | INTRAVENOUS | Status: AC
  Administered 2019-01-19: 300 mg via INTRAVENOUS
  Filled 2019-01-19: qty 14.29

## 2019-01-19 NOTE — Patient Instructions (Signed)
Implanted Port Insertion, Care After  This sheet gives you information about how to care for yourself after your procedure. Your health care provider may also give you more specific instructions. If you have problems or questions, contact your health care provider.  What can I expect after the procedure?  After the procedure, it is common to have:  · Discomfort at the port insertion site.  · Bruising on the skin over the port. This should improve over 3-4 days.  Follow these instructions at home:  Port care  · After your port is placed, you will get a manufacturer's information card. The card has information about your port. Keep this card with you at all times.  · Take care of the port as told by your health care provider. Ask your health care provider if you or a family member can get training for taking care of the port at home. A home health care nurse may also take care of the port.  · Make sure to remember what type of port you have.  Incision care         · Follow instructions from your health care provider about how to take care of your port insertion site. Make sure you:  ? Wash your hands with soap and water before and after you change your bandage (dressing). If soap and water are not available, use hand sanitizer.  ? Change your dressing as told by your health care provider.  ? Leave stitches (sutures), skin glue, or adhesive strips in place. These skin closures may need to stay in place for 2 weeks or longer. If adhesive strip edges start to loosen and curl up, you may trim the loose edges. Do not remove adhesive strips completely unless your health care provider tells you to do that.  · Check your port insertion site every day for signs of infection. Check for:  ? Redness, swelling, or pain.  ? Fluid or blood.  ? Warmth.  ? Pus or a bad smell.  Activity  · Return to your normal activities as told by your health care provider. Ask your health care provider what activities are safe for you.  · Do not  lift anything that is heavier than 10 lb (4.5 kg), or the limit that you are told, until your health care provider says that it is safe.  General instructions  · Take over-the-counter and prescription medicines only as told by your health care provider.  · Do not take baths, swim, or use a hot tub until your health care provider approves. Ask your health care provider if you may take showers. You may only be allowed to take sponge baths.  · Do not drive for 24 hours if you were given a sedative during your procedure.  · Wear a medical alert bracelet in case of an emergency. This will tell any health care providers that you have a port.  · Keep all follow-up visits as told by your health care provider. This is important.  Contact a health care provider if:  · You cannot flush your port with saline as directed, or you cannot draw blood from the port.  · You have a fever or chills.  · You have redness, swelling, or pain around your port insertion site.  · You have fluid or blood coming from your port insertion site.  · Your port insertion site feels warm to the touch.  · You have pus or a bad smell coming from the port   insertion site.  Get help right away if:  · You have chest pain or shortness of breath.  · You have bleeding from your port that you cannot control.  Summary  · Take care of the port as told by your health care provider. Keep the manufacturer's information card with you at all times.  · Change your dressing as told by your health care provider.  · Contact a health care provider if you have a fever or chills or if you have redness, swelling, or pain around your port insertion site.  · Keep all follow-up visits as told by your health care provider.  This information is not intended to replace advice given to you by your health care provider. Make sure you discuss any questions you have with your health care provider.  Document Released: 08/11/2013 Document Revised: 05/19/2018 Document Reviewed:  05/19/2018  Elsevier Interactive Patient Education © 2019 [employer]

## 2019-01-19 NOTE — Progress Notes (Signed)
Hematology and Oncology Follow Up Visit  Taylor Flynn [id number] 12-Nov-1975 43 y.o. 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  She is doing quite well.  Her last CA-27-29 was only 15.2.  She had an MRI of the abdomen on 01/02/2019.  This did not show any evidence of metastatic disease in the abdomen or pelvis.  She was quite constipated.  She really has done incredibly well with her protocol.  She has had no problems with nausea or vomiting.  She had no change in bowel or bladder habits.  Again she is little bit constipated.  She is working with her therapist on this.  She has had no bony pain.  She has had no leg swelling.  She has had no rashes.  There is been no cardiac issues.  Her last echocardiogram was the end of January.  Her ejection fraction was 55-60%.   Overall, her performance status is ECOG 0.   Medications:  Allergies as of 01/19/2019      Reactions   Aspirin Swelling   Angioedema    Sulfa Antibiotics Rash      Medication List       Accurate as of January 19, 2019  9:27 AM. Always use your most recent med list.        bicalutamide 50 MG tablet Commonly known as:  CASODEX Take 150 mg by mouth daily.   COQ10 PO Take 1 Dose by mouth 2 (two) times daily.   cyclophosphamide 50 MG capsule Commonly known as:  CYTOXAN Take 50 mg by mouth every evening.   desloratadine 5 MG tablet Commonly known as:  CLARINEX Take 5 mg by mouth daily as needed.   EPINEPHrine 0.3 mg/0.3 mL [place] injection Commonly known as:  EPI-PEN Inject 0.3 mg into the muscle as needed for anaphylaxis.   EUROPEAN MISTLETOE [place] Inject 100 mg into the skin every Monday, Wednesday, and Friday.   HERCEPTIN IV Inject 1 Dose into the vein every 21 ( twenty-one) days.  Combined with Perjeta   ivermectin 3 MG Tabs tablet Commonly known as:  STROMECTOL Take 6 mg by mouth daily.   methotrexate 2.5 MG tablet Commonly known as:  RHEUMATREX Take 2.5 mg by mouth 2 (two) times a week. Caution:Chemotherapy. Protect from light. Sundays and Wednesdays   NALTREXONE HCL PO Take 4.5 mg by mouth See admin instructions. Take 4.5 mg by mouth at night [place] through [place] Take 1 tablet by mouth daily. calcium microcrystalline hydroxyapatite otc supplement   OVER THE COUNTER MEDICATION Take 2-4 capsules by mouth daily. Salvestrol-  Take 4 capsules every morning and 2 capsules in the aftenroon   OVER THE COUNTER MEDICATION Take 1-2 capsules by mouth See admin instructions. lumbrokinase - Take 1 capsule in the morning 2 capsules at lunch and 1 capsule at bedtime   OVER THE COUNTER MEDICATION Take 1 Dose by mouth at bedtime. cbd oil   OVER THE COUNTER MEDICATION Take 4 capsules by mouth See admin instructions. fenbencur take 4 capsules by mouth at bedtime Sunday through Tuesday   OVER THE COUNTER MEDICATION Take 3 drops by mouth 3 (three) times daily. immunis   OVER THE COUNTER MEDICATION Take 3 drops by mouth 3 (three) times daily. Phung-ex  OVER THE COUNTER MEDICATION Take 1 Dose by mouth daily. Lymph-tone 2   OVER THE COUNTER MEDICATION Take 1 Dose by mouth daily. Pineal code   OVER THE COUNTER MEDICATION Take 30 mLs by mouth 2 (two) times daily. [place]   PERJETA IV Inject 1 Dose into the vein every 21 ( twenty-one) days. Combined with Herceptin   Probiotic Caps Take 2 capsules by mouth every other day. Daily with a meal   VITAMIN D-VITAMIN K PO Place 5 drops under the tongue daily.   ZOMETA IV Inject 1 Dose into the vein every 6 (six) weeks.       Allergies:  Allergies  Allergen Reactions  . Aspirin Swelling    Angioedema   . Sulfa Antibiotics Rash    Past Medical History, Surgical history, Social history,  and Family History were reviewed and updated.  Review of Systems: Review of Systems  Constitutional: Negative.   HENT: Negative.   Eyes: Negative.   Respiratory: Negative.   Cardiovascular: Negative.   Gastrointestinal: Negative.   Genitourinary: Negative.   Musculoskeletal: Negative.   Skin: Negative.   Neurological: Negative.   Endo/Heme/Allergies: Negative.   Psychiatric/Behavioral: Negative.      Physical Exam:  weight is 119 lb (54 kg). Her oral temperature is 98 F (36.7 C). Her blood pressure is 107/63 and her pulse is 67. Her respiration is 16 and oxygen saturation is 100%.   Wt Readings from Last 3 Encounters:  01/19/19 119 lb (54 kg)  12/29/18 120 lb (54.4 kg)  12/08/18 114 lb 6.4 oz (51.9 kg)    Physical Exam Vitals signs reviewed.  Constitutional:      Comments: Breast exam shows right breast with no masses, edema or erythema.  There is no right axillary adenopathy.  Left chest wall shows well-healed mastectomy.  She has no left chest wall nodules.  She has no left axillary adenopathy.  HENT:     Head: Normocephalic and atraumatic.  Eyes:     Pupils: Pupils are equal, round, and reactive to light.  Neck:     Musculoskeletal: Normal range of motion.  Cardiovascular:     Rate and Rhythm: Normal rate and regular rhythm.     Heart sounds: Normal heart sounds.  Pulmonary:     Effort: Pulmonary effort is normal.     Breath sounds: Normal breath sounds.  Abdominal:     General: Bowel sounds are normal.     Palpations: Abdomen is soft.     Comments: Abdominal exam shows the healing excisional biopsy in the left upper quadrant.  There is no palpable hepatomegaly.  There is no fluid wave.  She has no guarding or rebound tenderness.  Musculoskeletal: Normal range of motion.        General: No tenderness or deformity.  Lymphadenopathy:     Cervical: No cervical adenopathy.  Skin:    General: Skin is warm and dry.     Findings: No erythema or rash.   Neurological:     Mental Status: She is alert and oriented to person, place, and time.  Psychiatric:        Behavior: Behavior normal.        Thought Content: Thought content normal.        Judgment: Judgment normal.      Lab Results  Component Value Date   WBC 4.6 01/19/2019   HGB 9.4 (L) 01/19/2019   HCT 29.7 (L) 01/19/2019   MCV 89.5 01/19/2019   PLT 248 01/19/2019  Lab Results  Component Value Date   FERRITIN 4 (L) 12/29/2018   IRON 30 (L) 12/29/2018   TIBC 431 12/29/2018   UIBC 401 (H) 12/29/2018   IRONPCTSAT 7 (L) 12/29/2018   Lab Results  Component Value Date   RETICCTPCT 0.8 11/17/2018   RBC 3.32 (L) 01/19/2019   No results found for: KPAFRELGTCHN, LAMBDASER, KAPLAMBRATIO No results found for: IGGSERUM, IGA, IGMSERUM No results found for: Odetta Pink, SPEI   Chemistry      Component Value Date/Time   NA 139 12/29/2018 0850   K 4.1 12/29/2018 0850   CL 105 12/29/2018 0850   CO2 27 12/29/2018 0850   BUN 15 12/29/2018 0850   CREATININE 1.03 (H) 12/29/2018 0850      Component Value Date/Time   CALCIUM 9.3 12/29/2018 0850   ALKPHOS 52 12/29/2018 0850   AST 17 12/29/2018 0850   ALT 9 12/29/2018 0850   BILITOT 0.3 12/29/2018 0850       Impression and Plan: Ms. Behnke is a very pleasant 43 yo African American female with metastatic breast cancer.   For right now, we will continue her on the Herceptin and Perjeta.  I do not see any reason to make a change with her protocol right now.  The MRI clearly was helpful to get.  We will go ahead with her treatment today.  We will plan to get her back in another 3 weeks.  [name], MD 3/17/20209:27 AM

## 2019-01-19 NOTE — Patient Instructions (Signed)
Pertuzumab injection What is this medicine? PERTUZUMAB (per TOOZ ue mab) is a monoclonal antibody. It is used to treat breast cancer. This medicine may be used for other purposes; ask your health care provider or pharmacist if you have questions. COMMON BRAND NAME(S): PERJETA What should I tell my health care provider before I take this medicine? They need to know if you have any of these conditions: -heart disease -heart failure -high blood pressure -history of irregular heart beat -recent or ongoing radiation therapy -an unusual or allergic reaction to pertuzumab, other medicines, foods, dyes, or preservatives -pregnant or trying to get pregnant -breast-feeding How should I use this medicine? This medicine is for infusion into a vein. It is given by a health care professional in a hospital or clinic setting. Talk to your pediatrician regarding the use of this medicine in children. Special care may be needed. Overdosage: If you think you have taken too much of this medicine contact a poison control center or emergency room at once. NOTE: This medicine is only for you. Do not share this medicine with others. What if I miss a dose? It is important not to miss your dose. Call your doctor or health care professional if you are unable to keep an appointment. What may interact with this medicine? Interactions are not expected. Give your health care provider a list of all the medicines, herbs, non-prescription drugs, or dietary supplements you use. Also tell them if you smoke, drink alcohol, or use illegal drugs. Some items may interact with your medicine. This list may not describe all possible interactions. Give your health care provider a list of all the medicines, herbs, non-prescription drugs, or dietary supplements you use. Also tell them if you smoke, drink alcohol, or use illegal drugs. Some items may interact with your medicine. What should I watch for while using this medicine? Your  condition will be monitored carefully while you are receiving this medicine. Report any side effects. Continue your course of treatment even though you feel ill unless your doctor tells you to stop. Do not become pregnant while taking this medicine or for 7 months after stopping it. Women should inform their doctor if they wish to become pregnant or think they might be pregnant. Women of child-bearing potential will need to have a negative pregnancy test before starting this medicine. There is a potential for serious side effects to an unborn child. Talk to your health care professional or pharmacist for more information. Do not breast-feed an infant while taking this medicine or for 7 months after stopping it. Women must use effective birth control with this medicine. Call your doctor or health care professional for advice if you get a fever, chills or sore throat, or other symptoms of a cold or flu. Do not treat yourself. Try to avoid being around people who are sick. You may experience fever, chills, and headache during the infusion. Report any side effects during the infusion to your health care professional. What side effects may I notice from receiving this medicine? Side effects that you should report to your doctor or health care professional as soon as possible: -breathing problems -chest pain or palpitations -dizziness -feeling faint or lightheaded -fever or chills -skin rash, itching or hives -sore throat -swelling of the face, lips, or tongue -swelling of the legs or ankles -unusually weak or tired Side effects that usually do not require medical attention (report to your doctor or health care professional if they continue or are bothersome): -diarrhea -hair   loss -nausea, vomiting -tiredness This list may not describe all possible side effects. Call your doctor for medical advice about side effects. You may report side effects to FDA at 1-800-FDA-1088. Where should I keep my  medicine? This drug is given in a hospital or clinic and will not be stored at home. NOTE: This sheet is a summary. It may not cover all possible information. If you have questions about this medicine, talk to your doctor, pharmacist, or health care provider.  2019 Elsevier/Gold Standard (2015-11-23 12:08:50) Trastuzumab injection for infusion What is this medicine? TRASTUZUMAB (tras TOO zoo mab) is a monoclonal antibody. It is used to treat breast cancer and stomach cancer. This medicine may be used for other purposes; ask your health care provider or pharmacist if you have questions. COMMON BRAND NAME(S): Herceptin, KANJINTI, OGIVRI What should I tell my health care provider before I take this medicine? They need to know if you have any of these conditions: -heart disease -heart failure -lung or breathing disease, like asthma -an unusual or allergic reaction to trastuzumab, benzyl alcohol, or other medications, foods, dyes, or preservatives -pregnant or trying to get pregnant -breast-feeding How should I use this medicine? This drug is given as an infusion into a vein. It is administered in a hospital or clinic by a specially trained health care professional. Talk to your pediatrician regarding the use of this medicine in children. This medicine is not approved for use in children. Overdosage: If you think you have taken too much of this medicine contact a poison control center or emergency room at once. NOTE: This medicine is only for you. Do not share this medicine with others. What if I miss a dose? It is important not to miss a dose. Call your doctor or health care professional if you are unable to keep an appointment. What may interact with this medicine? This medicine may interact with the following medications: -certain types of chemotherapy, such as daunorubicin, doxorubicin, epirubicin, and idarubicin This list may not describe all possible interactions. Give your health care  provider a list of all the medicines, herbs, non-prescription drugs, or dietary supplements you use. Also tell them if you smoke, drink alcohol, or use illegal drugs. Some items may interact with your medicine. What should I watch for while using this medicine? Visit your doctor for checks on your progress. Report any side effects. Continue your course of treatment even though you feel ill unless your doctor tells you to stop. Call your doctor or health care professional for advice if you get a fever, chills or sore throat, or other symptoms of a cold or flu. Do not treat yourself. Try to avoid being around people who are sick. You may experience fever, chills and shaking during your first infusion. These effects are usually mild and can be treated with other medicines. Report any side effects during the infusion to your health care professional. Fever and chills usually do not happen with later infusions. Do not become pregnant while taking this medicine or for 7 months after stopping it. Women should inform their doctor if they wish to become pregnant or think they might be pregnant. Women of child-bearing potential will need to have a negative pregnancy test before starting this medicine. There is a potential for serious side effects to an unborn child. Talk to your health care professional or pharmacist for more information. Do not breast-feed an infant while taking this medicine or for 7 months after stopping it. Women must use effective   birth control with this medicine. What side effects may I notice from receiving this medicine? Side effects that you should report to your doctor or health care professional as soon as possible: -allergic reactions like skin rash, itching or hives, swelling of the face, lips, or tongue -chest pain or palpitations -cough -dizziness -feeling faint or lightheaded, falls -fever -general ill feeling or flu-like symptoms -signs of worsening heart failure like  breathing problems; swelling in your legs and feet -unusually weak or tired Side effects that usually do not require medical attention (report to your doctor or health care professional if they continue or are bothersome): -bone pain -changes in taste -diarrhea -joint pain -nausea/vomiting -weight loss This list may not describe all possible side effects. Call your doctor for medical advice about side effects. You may report side effects to FDA at 1-800-FDA-1088. Where should I keep my medicine? This drug is given in a hospital or clinic and will not be stored at home. NOTE: This sheet is a summary. It may not cover all possible information. If you have questions about this medicine, talk to your doctor, pharmacist, or health care provider.  2019 Elsevier/Gold Standard (2016-10-15 14:37:52) Zoledronic Acid injection (Hypercalcemia, Oncology) What is this medicine? ZOLEDRONIC ACID (ZOE le dron ik AS id) lowers the amount of calcium loss from bone. It is used to treat too much calcium in your blood from cancer. It is also used to prevent complications of cancer that has spread to the bone. This medicine may be used for other purposes; ask your health care provider or pharmacist if you have questions. COMMON BRAND NAME(S): Zometa What should I tell my health care provider before I take this medicine? They need to know if you have any of these conditions: -aspirin-sensitive asthma -cancer, especially if you are receiving medicines used to treat cancer -dental disease or wear dentures -infection -kidney disease -receiving corticosteroids like dexamethasone or prednisone -an unusual or allergic reaction to zoledronic acid, other medicines, foods, dyes, or preservatives -pregnant or trying to get pregnant -breast-feeding How should I use this medicine? This medicine is for infusion into a vein. It is given by a health care professional in a hospital or clinic setting. Talk to your  pediatrician regarding the use of this medicine in children. Special care may be needed. Overdosage: If you think you have taken too much of this medicine contact a poison control center or emergency room at once. NOTE: This medicine is only for you. Do not share this medicine with others. What if I miss a dose? It is important not to miss your dose. Call your doctor or health care professional if you are unable to keep an appointment. What may interact with this medicine? -certain antibiotics given by injection -NSAIDs, medicines for pain and inflammation, like ibuprofen or naproxen -some diuretics like bumetanide, furosemide -teriparatide -thalidomide This list may not describe all possible interactions. Give your health care provider a list of all the medicines, herbs, non-prescription drugs, or dietary supplements you use. Also tell them if you smoke, drink alcohol, or use illegal drugs. Some items may interact with your medicine. What should I watch for while using this medicine? Visit your doctor or health care professional for regular checkups. It may be some time before you see the benefit from this medicine. Do not stop taking your medicine unless your doctor tells you to. Your doctor may order blood tests or other tests to see how you are doing. Women should inform their doctor if  they wish to become pregnant or think they might be pregnant. There is a potential for serious side effects to an unborn child. Talk to your health care professional or pharmacist for more information. You should make sure that you get enough calcium and vitamin D while you are taking this medicine. Discuss the foods you eat and the vitamins you take with your health care professional. Some people who take this medicine have severe bone, joint, and/or muscle pain. This medicine may also increase your risk for jaw problems or a broken thigh bone. Tell your doctor right away if you have severe pain in your jaw,  bones, joints, or muscles. Tell your doctor if you have any pain that does not go away or that gets worse. Tell your dentist and dental surgeon that you are taking this medicine. You should not have major dental surgery while on this medicine. See your dentist to have a dental exam and fix any dental problems before starting this medicine. Take good care of your teeth while on this medicine. Make sure you see your dentist for regular follow-up appointments. What side effects may I notice from receiving this medicine? Side effects that you should report to your doctor or health care professional as soon as possible: -allergic reactions like skin rash, itching or hives, swelling of the face, lips, or tongue -anxiety, confusion, or depression -breathing problems -changes in vision -eye pain -feeling faint or lightheaded, falls -jaw pain, especially after dental work -mouth sores -muscle cramps, stiffness, or weakness -redness, blistering, peeling or loosening of the skin, including inside the mouth -trouble passing urine or change in the amount of urine Side effects that usually do not require medical attention (report to your doctor or health care professional if they continue or are bothersome): -bone, joint, or muscle pain -constipation -diarrhea -fever -hair loss -irritation at site where injected -loss of appetite -nausea, vomiting -stomach upset -trouble sleeping -trouble swallowing -weak or tired This list may not describe all possible side effects. Call your doctor for medical advice about side effects. You may report side effects to FDA at 1-800-FDA-1088. Where should I keep my medicine? This drug is given in a hospital or clinic and will not be stored at home. NOTE: This sheet is a summary. It may not cover all possible information. If you have questions about this medicine, talk to your doctor, pharmacist, or health care provider.  2019 Elsevier/Gold Standard (2014-03-19  14:19:39)

## 2019-01-20 ENCOUNTER — Encounter: Admitting: Rehabilitation

## 2019-01-20 LAB — CANCER ANTIGEN 27.29: CA 27.29: 15 U/mL (ref 0.0–38.6)

## 2019-01-28 ENCOUNTER — Encounter: Admitting: Physical Therapy

## 2019-02-02 ENCOUNTER — Encounter: Admitting: Physical Therapy

## 2019-02-04 ENCOUNTER — Telehealth: Payer: Self-pay | Admitting: Physical Therapy

## 2019-02-04 ENCOUNTER — Encounter: Admitting: Physical Therapy

## 2019-02-04 NOTE — Telephone Encounter (Signed)
Called pt and spoke with her about possibility of doing an evisit. Pt was very interested in this and would like to learn self MLD over evisit when possible. She would also like to be called to reschedule once [facility] re[facility].  Allyson Sabal [place], [place] 02/04/19 1:08 PM

## 2019-02-09 ENCOUNTER — Ambulatory Visit: Admitting: Hematology & Oncology

## 2019-02-09 ENCOUNTER — Other Ambulatory Visit

## 2019-02-09 ENCOUNTER — Ambulatory Visit

## 2019-02-11 ENCOUNTER — Ambulatory Visit: Admitting: Physical Therapy

## 2019-02-15 ENCOUNTER — Ambulatory Visit: Admitting: Physical Therapy

## 2019-02-23 ENCOUNTER — Ambulatory Visit: Attending: Hematology & Oncology | Admitting: Physical Therapy

## 2019-02-23 ENCOUNTER — Encounter: Payer: Self-pay | Admitting: Physical Therapy

## 2019-02-23 ENCOUNTER — Other Ambulatory Visit: Payer: Self-pay

## 2019-02-23 DIAGNOSIS — I890 Lymphedema, not elsewhere classified: Secondary | ICD-10-CM | POA: Insufficient documentation

## 2019-02-23 DIAGNOSIS — R293 Abnormal posture: Secondary | ICD-10-CM | POA: Diagnosis present

## 2019-02-23 DIAGNOSIS — M25612 Stiffness of left shoulder, not elsewhere classified: Secondary | ICD-10-CM | POA: Diagnosis present

## 2019-02-23 NOTE — Patient Instructions (Signed)
Access Code: [email or web address]  URL: [email or web address]  Date: 02/23/2019  Prepared by: Maudry Diego   Program Notes  For free streaming video of self manual lymph drainage: Go to [email or web address], then go to  Resources (drop down menu on far right side) Self-Care Videos Self-MLD, Upper and Lower Extremity (this will direct you to [email or web address] and scroll down this page Watch Self MLD intro. then choose appropriate video and download the PDF for written step by step if you like           Exercises  Sawing with Compression Garment - 15 reps - 1 sets - 2x daily - 7x weekly  Trunk Sidebending with Compression Garment - 15 reps - 1 sets - 2x daily - 7x weekly  Scapular Retraction with Compression Garment - 15 reps - 1 sets - 2x daily - 7x weekly  Bicep Curls with Compression Garment - 15 reps - 1 sets - 2x daily - 7x weekly  Punch Up with Compression Garment - 15 reps - 1 sets - 2x daily - 7x weekly  Seated Shoulder Circles with Compression Garment - 15 reps - 1 sets - 2x daily - 7x weekly  Wrist Flexion and Extension with Compression Garment - 15 reps - 1 sets - 2x daily - 7x weekly  Seated Diaphragmatic Breathing - 15 reps - 1 sets - 2x daily - 7x weekly  Patient Education  Lymphedema: Exercise Considerations  Lymphedema Risk Reduction

## 2019-02-23 NOTE — Therapy (Signed)
[place], [place], [address] Phone: [phone]   Fax:  [phone]  Physical Therapy Treatment  Patient Details  Name: Taylor Flynn MRN: [id number] Date of Birth: May 01, 1976 Referring Provider (PT): Hale Bogus  Therapy Telehealth Visit:  I connected with Taylor Flynn today at 2:15 pm  by [facility] video conference and verified that I am speaking with the correct person using two identifiers.  I discussed the limitations, risks, security and privacy concerns of performing an evaluation and management service by Webex and the availability of in person appointments.  I also discussed with the patient that there may be a patient responsible charge related to this service. The patient expressed understanding and agreed to proceed.    The patient's address was confirmed.  Identified to the patient that therapist is a licensed PT in the state of [place].  Verified phone # as [phone] to call in case of technical difficulties.   Encounter Date: 02/23/2019  PT End of Session - 02/23/19 1734    Visit Number  2    Number of Visits  9    Date for PT Re-Evaluation  05/25/19    PT Start Time  6712    PT Stop Time  1500    PT Time Calculation (min)  40 min    Activity Tolerance  Patient tolerated treatment well    Behavior During Therapy  [facility] for tasks assessed/performed       Past Medical History:  Diagnosis Date  . Breast cancer metastasized to liver, unspecified laterality ([place]) 10/08/2018  . 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 Narcolepsy   . Scoliosis    MILD  . Urge incontinence     Past Surgical History:  Procedure Laterality Date  . BREAST BIOPSY Left 01/2016  . CESAREAN SECTION  2012  . HERNIA REPAIR     ? Inguinal, but patient unsure.  Age 29  . MASS EXCISION Left 11/24/2018   Procedure: EXCISION NODULE LEFT CHEST WALL ERAS PATHWAY;  Surgeon: Fanny Skates, MD;  Location:  [facility];  Service: General;  Laterality: Left;  . SENTINEL NODE BIOPSY Left 2017 APRIL    There were no vitals filed for this visit.  Subjective Assessment - 02/23/19 1720    Subjective  Pt reports she is still having fullness in her left upper chest and upper arm that flucutates.  She says she has numbness in her left upper arm also     Pertinent History  left breast cancer metastasized to the liver, skin, and bone April 2017 metastasized Dec 2019, pt completed chemotherapy and is currently taking Herceptin and Projeta    Patient Stated Goals  to get the swelling down, improve lymphatic flow    Currently in Pain?  No/denies         [facility] PT Assessment - 02/23/19 0001      Assessment   Medical Diagnosis  left breast cancer    Referring Provider (PT)  Hale Bogus    Onset Date/Surgical Date  02/03/16      Prior Function   Level of Independence  Independent                   [facility] Adult PT Treatment/Exercise - 02/23/19 0001      Self-Care   Self-Care  Other Self-Care Comments    Other Self-Care Comments   reviewed the basics of the lymphatic system and how to do the strokes for  self manual lymph drainage.  Then, demonstrated while pt did techniques along with me for short neck, diaphragmatic breathing, left inguinal nodes., left axillo-inguinal anastamosis, right axillary nodes, anterior interaxillary anastamosis, left posterior neck, left upper shoulder, left upper arm, medial to central upper arm across chest to right axilla, left posterior upper arm through axilla to inguinal nodes.  Also showed pt Medbridge program including links for self MLD on the internet . She expressed understanding.              PT Education - 02/23/19 1733    Education Details  self manual lymph drainage    Person(s) Educated  Patient    Methods  Explanation;Demonstration;Other (comment)   weblinks   Comprehension  Verbalized understanding;Returned demonstration;Other (comment)   pt  would like a recheck to make sure she is doing it correctly          PT Long Term Goals - 02/23/19 1740      PT LONG TERM GOAL #1   Title  Pt will obtain appropriate compression sleeve and gauntlet for long term management of lymphedema    Time  8    Period  Weeks    Status  On-going      PT LONG TERM GOAL #2   Title  Pt will be independent in a home exercise program to increase end range L shoulder ROM    Time  8    Period  Weeks    Status  On-going      PT LONG TERM GOAL #3   Title  Pt will be independent in self MLD for long term management of lymphedema    Time  8    Period  Weeks    Status  On-going            Plan - 02/23/19 1735    Clinical Impression Statement  Follow up visit has been delayed due to coronovirus stay in place state order so pt was seen with a telehealth visit today.  She was able to return demonstrate instruction of self manual lymph drainage to left upper quadrant. and was emailed information on remedial exercise and weblinks for videos for self manual lymph drainage along with other lymphedema self care information.  She was appreciative and was able to perform techniques.  Will follow up with a phone call next week to see how she is doing and set up anothe telehealth visit as needed  Pt may be interested in a Tribute nighttime garment for trunk swelling management. Recert sent today to cover prolonged time period     PT Frequency  Other (comment)   as indicated according to pt progress   PT Duration  8 weeks    PT Treatment/Interventions  ADLs/Self Care Home Management;Therapeutic activities;Therapeutic exercise;Manual techniques;Manual lymph drainage;Compression bandaging;Passive range of motion;Vasopneumatic Device;Taping    PT Next Visit Plan  review pt self MLD, assist pt with getting compression sleeve and gauntlet or tribute give pt ROM exercises for end range L shoulder ROM    Consulted and Agree with Plan of Care  Patient       Patient  will benefit from skilled therapeutic intervention in order to improve the following deficits and impairments:     Visit Diagnosis: Lymphedema, not elsewhere classified  Stiffness of left shoulder, not elsewhere classified  Abnormal posture     Problem List Patient Active Problem List   Diagnosis Date Noted  . 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 Miscarriage 08/05/2018  . Multigravida of advanced maternal age 08/05/2018  . Pruritus of vagina 10/05/2018  . 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 Angio-edema 09/25/2018  . Malignant neoplasm of overlapping sites of left breast in female, estrogen receptor negative ([place]) 10/21/2017   Donato Heinz. Owens Shark PT  Norwood Levo 02/23/2019, 5:42 PM  [place], [place], [address] Phone: [phone]   Fax:  [phone]  Name: Taylor Flynn MRN: [id number] Date of Birth: 1976-10-29

## 2019-02-24 ENCOUNTER — Ambulatory Visit: Admitting: Physical Therapy

## 2019-03-02 ENCOUNTER — Inpatient Hospital Stay

## 2019-03-02 ENCOUNTER — Inpatient Hospital Stay: Attending: Hematology & Oncology | Admitting: Hematology & Oncology

## 2019-03-02 ENCOUNTER — Other Ambulatory Visit: Payer: Self-pay

## 2019-03-02 DIAGNOSIS — C787 Secondary malignant neoplasm of liver and intrahepatic bile duct: Secondary | ICD-10-CM

## 2019-03-02 DIAGNOSIS — Z5112 Encounter for antineoplastic immunotherapy: Secondary | ICD-10-CM | POA: Diagnosis not present

## 2019-03-02 DIAGNOSIS — Z171 Estrogen receptor negative status [ER-]: Secondary | ICD-10-CM | POA: Diagnosis not present

## 2019-03-02 DIAGNOSIS — C50919 Malignant neoplasm of unspecified site of unspecified female breast: Secondary | ICD-10-CM

## 2019-03-02 DIAGNOSIS — C792 Secondary malignant neoplasm of skin: Secondary | ICD-10-CM

## 2019-03-02 DIAGNOSIS — Z79899 Other long term (current) drug therapy: Secondary | ICD-10-CM | POA: Insufficient documentation

## 2019-03-02 DIAGNOSIS — C50912 Malignant neoplasm of unspecified site of left female breast: Secondary | ICD-10-CM | POA: Diagnosis not present

## 2019-03-02 DIAGNOSIS — C773 Secondary and unspecified malignant neoplasm of axilla and upper limb lymph nodes: Secondary | ICD-10-CM | POA: Insufficient documentation

## 2019-03-02 LAB — CMP (CANCER CENTER ONLY)
ALT: 10 U/L (ref 0–44)
AST: 18 U/L (ref 15–41)
Albumin: 4.2 g/dL (ref 3.5–5.0)
Alkaline Phosphatase: 48 U/L (ref 38–126)
Anion gap: 7 (ref 5–15)
BUN: 13 mg/dL (ref 6–20)
CO2: 28 mmol/L (ref 22–32)
Calcium: 9.5 mg/dL (ref 8.9–10.3)
Chloride: 105 mmol/L (ref 98–111)
Creatinine: 1.13 mg/dL — ABNORMAL HIGH (ref 0.44–1.00)
GFR, Est AFR Am: >60 mL/min (ref >60)
GFR, Estimated: 59 mL/min — ABNORMAL LOW (ref >60)
Glucose, Bld: 83 mg/dL (ref 70–99)
Potassium: 4.1 mmol/L (ref 3.5–5.1)
Sodium: 140 mmol/L (ref 135–145)
Total Bilirubin: 0.4 mg/dL (ref 0.3–1.2)
Total Protein: 6.8 g/dL (ref 6.5–8.1)

## 2019-03-02 LAB — CBC WITH DIFFERENTIAL (CANCER CENTER ONLY)
Abs Immature Granulocytes: 0 10*3/uL (ref 0.00–0.07)
Basophils Absolute: 0 10*3/uL (ref 0.0–0.1)
Basophils Relative: 0 %
Eosinophils Absolute: 0.1 10*3/uL (ref 0.0–0.5)
Eosinophils Relative: 2 %
HCT: 29.2 % — ABNORMAL LOW (ref 36.0–46.0)
Hemoglobin: 9.2 g/dL — ABNORMAL LOW (ref 12.0–15.0)
Immature Granulocytes: 0 %
Lymphocytes Relative: 28 %
Lymphs Abs: 1 10*3/uL (ref 0.7–4.0)
MCH: 28 pg (ref 26.0–34.0)
MCHC: 31.5 g/dL (ref 30.0–36.0)
MCV: 89 fL (ref 80.0–100.0)
Monocytes Absolute: 0.4 10*3/uL (ref 0.1–1.0)
Monocytes Relative: 10 %
Neutro Abs: 2.1 10*3/uL (ref 1.7–7.7)
Neutrophils Relative %: 60 %
Platelet Count: 225 10*3/uL (ref 150–400)
RBC: 3.28 MIL/uL — ABNORMAL LOW (ref 3.87–5.11)
RDW: 15.9 % — ABNORMAL HIGH (ref 11.5–15.5)
WBC Count: 3.5 10*3/uL — ABNORMAL LOW (ref 4.0–10.5)
nRBC: 0 % (ref 0.0–0.2)

## 2019-03-02 LAB — IRON AND TIBC
Iron: 99 ug/dL (ref 41–142)
Saturation Ratios: 25 % (ref 21–57)
TIBC: 401 ug/dL (ref 236–444)
UIBC: 303 ug/dL (ref 120–384)

## 2019-03-02 LAB — FERRITIN: Ferritin: 9 ng/mL — ABNORMAL LOW (ref 11–307)

## 2019-03-02 MED ORDER — HEPARIN SOD (PORK) LOCK FLUSH 100 UNIT/ML IV SOLN
500.0000 [IU] | Freq: Once | INTRAVENOUS | Status: AC | PRN
  Administered 2019-03-02: 500 [IU]
  Filled 2019-03-02: qty 5

## 2019-03-02 MED ORDER — DIPHENHYDRAMINE HCL 25 MG PO CAPS
ORAL_CAPSULE | ORAL | Status: AC
  Filled 2019-03-02: qty 2

## 2019-03-02 MED ORDER — TRASTUZUMAB CHEMO 150 MG IV SOLR
300.0000 mg | Freq: Once | INTRAVENOUS | Status: AC
  Administered 2019-03-02: 300 mg via INTRAVENOUS
  Filled 2019-03-02: qty 14.29

## 2019-03-02 MED ORDER — SODIUM CHLORIDE 0.9 % IV SOLN
420.0000 mg | Freq: Once | INTRAVENOUS | Status: AC
  Administered 2019-03-02: 420 mg via INTRAVENOUS
  Filled 2019-03-02: qty 14

## 2019-03-02 MED ORDER — SODIUM CHLORIDE 0.9 % IV SOLN
Freq: Once | INTRAVENOUS | Status: AC
  Administered 2019-03-02: 11:00:00 via INTRAVENOUS
  Filled 2019-03-02: qty 250

## 2019-03-02 MED ORDER — DIPHENHYDRAMINE HCL 25 MG PO CAPS: 50.0000 mg | ORAL_CAPSULE | Freq: Once | ORAL | Status: DC

## 2019-03-02 MED ORDER — SODIUM CHLORIDE 0.9% FLUSH
10.0000 mL | INTRAVENOUS | Status: DC | PRN
  Administered 2019-03-02: 10 mL
  Filled 2019-03-02: qty 10

## 2019-03-02 MED ORDER — ACETAMINOPHEN 325 MG PO TABS
ORAL_TABLET | ORAL | Status: AC
  Filled 2019-03-02: qty 2

## 2019-03-02 MED ORDER — ACETAMINOPHEN 325 MG PO TABS: 650.0000 mg | ORAL_TABLET | Freq: Once | ORAL | Status: DC

## 2019-03-02 NOTE — Progress Notes (Signed)
Hematology and Oncology Follow Up Visit  Taylor Flynn [id number] [birth year] 43 y.o. 03/02/2019   Principle Diagnosis:  Metastatic breast cancer-ER-/HER-2+  Current Therapy:   Herceptin/Perjeta - s/p cycle #4 (previous cycles given in [place]) Zometa 4 mg IV q 2 months due again in May   Interim History:  Taylor Flynn is here today for follow-up.  She is doing fine.  She is still doing her naturopathic protocol.  This is fairly extensive.  However, she seems to be doing okay with it.  She said that she does not have a monthly cycle any longer.  This might be from the Cytoxan that she is taking.  Of note her last PET scan was done back in early February.  We may have to set her up with another PET scan in late May.  She has had no fever.  She has had no cough.  There is been no change in bowel or bladder habits.  Her last CA 27.29 was stable at 15.  She has had no headache.  There is been no mouth sores.  Of note, she is iron deficient.  Her iron studies that were done back in March showed a ferritin less than 4 with an iron saturation of only 6%.  I offered her IV iron but she just would not accept this.  She is still trying supplements.  Overall, her performance status is ECOG 1.  Medications:  Allergies as of 03/02/2019      Reactions   Aspirin Swelling   Angioedema    Sulfa Antibiotics Rash      Medication List       Accurate as of March 02, 2019 10:16 AM. Always use your most recent med list.        bicalutamide 50 MG tablet Commonly known as:  CASODEX Take 150 mg by mouth daily.   COQ10 PO Take 1 Dose by mouth 2 (two) times daily.   cyclophosphamide 50 MG capsule Commonly known as:  CYTOXAN Take 50 mg by mouth every evening.   desloratadine 5 MG tablet Commonly known as:  CLARINEX Take 5 mg by mouth daily as needed.   EPINEPHrine 0.3 mg/0.3 mL Soaj injection Commonly known as:  EPI-PEN Inject 0.3 mg into the muscle as needed for anaphylaxis.   EUROPEAN  MISTLETOE [place] Inject 100 mg into the skin every Monday, Wednesday, and Friday.   HERCEPTIN IV Inject 1 Dose into the vein every 21 ( twenty-one) days. Combined with Perjeta   ivermectin 3 MG Tabs tablet Commonly known as:  STROMECTOL Take 6 mg by mouth daily.   methotrexate 2.5 MG tablet Commonly known as:  RHEUMATREX Take 2.5 mg by mouth 2 (two) times a week. Caution:Chemotherapy. Protect from light. Sundays and Wednesdays   NALTREXONE HCL PO Take 4.5 mg by mouth See admin instructions. Take 4.5 mg by mouth at night Wen through [place] Take 1-2 capsules by mouth See admin instructions. lumbrokinase - Take 1 capsule in the morning 2 capsules at lunch and 1 capsule at bedtime   OVER THE COUNTER MEDICATION Take 1 Dose by mouth at bedtime. cbd oil   OVER THE COUNTER MEDICATION Take 4 capsules by mouth See admin instructions. fenbencur take 4 capsules by mouth at bedtime Sunday through Tuesday   OVER THE COUNTER MEDICATION Take 3 drops by mouth 3 (three) times daily. immunis   OVER THE COUNTER MEDICATION Take 3 drops by mouth 3 (three) times daily. Phung-ex  OVER THE COUNTER MEDICATION Take 1 Dose by mouth daily. Lymph-tone 2   OVER THE COUNTER MEDICATION Take 1 Dose by mouth daily. Pineal code   OVER THE COUNTER MEDICATION Take 30 mLs by mouth 2 (two) times daily. Haelen   PERJETA IV Inject 1 Dose into the vein every 21 ( twenty-one) days. Combined with Herceptin   Probiotic Caps Take 2 capsules by mouth every other day. Daily with a meal   VITAMIN D-VITAMIN K PO Place 5 drops under the tongue daily.   ZOMETA IV Inject 1 Dose into the vein every 6 (six) weeks.       Allergies:  Allergies  Allergen Reactions  . Aspirin Swelling    Angioedema   . Sulfa Antibiotics Rash    Past Medical History, Surgical history, Social history, and Family History were reviewed and updated.  Review of Systems: Review of Systems  Constitutional:  Negative.   HENT: Negative.   Eyes: Negative.   Respiratory: Negative.   Cardiovascular: Negative.   Gastrointestinal: Negative.   Genitourinary: Negative.   Musculoskeletal: Negative.   Skin: Negative.   Neurological: Negative.   Endo/Heme/Allergies: Negative.   Psychiatric/Behavioral: Negative.      Physical Exam:  vitals were not taken for this visit.   Wt Readings from Last 3 Encounters:  01/19/19 119 lb (54 kg)  12/29/18 120 lb (54.4 kg)  12/08/18 114 lb 6.4 oz (51.9 kg)    Physical Exam Vitals signs reviewed.  Constitutional:      Comments: Breast exam shows right breast with no masses, edema or erythema.  There is no right axillary adenopathy.  Left chest wall shows well-healed mastectomy.  She has no left chest wall nodules.  She has no left axillary adenopathy.  HENT:     Head: Normocephalic and atraumatic.  Eyes:     Pupils: Pupils are equal, round, and reactive to light.  Neck:     Musculoskeletal: Normal range of motion.  Cardiovascular:     Rate and Rhythm: Normal rate and regular rhythm.     Heart sounds: Normal heart sounds.  Pulmonary:     Effort: Pulmonary effort is normal.     Breath sounds: Normal breath sounds.  Abdominal:     General: Bowel sounds are normal.     Palpations: Abdomen is soft.     Comments: Abdominal exam shows the healing excisional biopsy in the left upper quadrant.  There is no palpable hepatomegaly.  There is no fluid wave.  She has no guarding or rebound tenderness.  Musculoskeletal: Normal range of motion.        General: No tenderness or deformity.  Lymphadenopathy:     Cervical: No cervical adenopathy.  Skin:    General: Skin is warm and dry.     Findings: No erythema or rash.  Neurological:     Mental Status: She is alert and oriented to person, place, and time.  Psychiatric:        Behavior: Behavior normal.        Thought Content: Thought content normal.        Judgment: Judgment normal.      Lab Results   Component Value Date   WBC 3.5 (L) 03/02/2019   HGB 9.2 (L) 03/02/2019   HCT 29.2 (L) 03/02/2019   MCV 89.0 03/02/2019   PLT 225 03/02/2019   Lab Results  Component Value Date   FERRITIN <4 (L) 01/19/2019   IRON 28 (L) 01/19/2019   TIBC 448 (H)  01/19/2019   UIBC 419 (H) 01/19/2019   IRONPCTSAT 6 (L) 01/19/2019   Lab Results  Component Value Date   RETICCTPCT 0.8 11/17/2018   RBC 3.28 (L) 03/02/2019   No results found for: KPAFRELGTCHN, LAMBDASER, KAPLAMBRATIO No results found for: IGGSERUM, IGA, IGMSERUM No results found for: Odetta Pink, SPEI   Chemistry      Component Value Date/Time   NA 139 01/19/2019 0835   K 3.9 01/19/2019 0835   CL 105 01/19/2019 0835   CO2 27 01/19/2019 0835   BUN 17 01/19/2019 0835   CREATININE 1.05 (H) 01/19/2019 0835      Component Value Date/Time   CALCIUM 8.9 01/19/2019 0835   ALKPHOS 58 01/19/2019 0835   AST 15 01/19/2019 0835   ALT 8 01/19/2019 0835   BILITOT 0.3 01/19/2019 0835       Impression and Plan: Ms. Mehl is a very pleasant 43 yo African American female with metastatic breast cancer.   We will proceed with the Herceptin/Perjeta protocol.  She is doing well with this.  She probably will need another echocardiogram in June or July at the latest.   Again, I will set her up with a PET scan for the end of May.  She will come back in 3 weeks for her next visit.    [name], MD 4/28/202010:16 AM

## 2019-03-02 NOTE — Patient Instructions (Signed)
Implanted Port Insertion, Care After  This sheet gives you information about how to care for yourself after your procedure. Your health care provider may also give you more specific instructions. If you have problems or questions, contact your health care provider.  What can I expect after the procedure?  After the procedure, it is common to have:  · Discomfort at the port insertion site.  · Bruising on the skin over the port. This should improve over 3-4 days.  Follow these instructions at home:  Port care  · After your port is placed, you will get a manufacturer's information card. The card has information about your port. Keep this card with you at all times.  · Take care of the port as told by your health care provider. Ask your health care provider if you or a family member can get training for taking care of the port at home. A home health care nurse may also take care of the port.  · Make sure to remember what type of port you have.  Incision care         · Follow instructions from your health care provider about how to take care of your port insertion site. Make sure you:  ? Wash your hands with soap and water before and after you change your bandage (dressing). If soap and water are not available, use hand sanitizer.  ? Change your dressing as told by your health care provider.  ? Leave stitches (sutures), skin glue, or adhesive strips in place. These skin closures may need to stay in place for 2 weeks or longer. If adhesive strip edges start to loosen and curl up, you may trim the loose edges. Do not remove adhesive strips completely unless your health care provider tells you to do that.  · Check your port insertion site every day for signs of infection. Check for:  ? Redness, swelling, or pain.  ? Fluid or blood.  ? Warmth.  ? Pus or a bad smell.  Activity  · Return to your normal activities as told by your health care provider. Ask your health care provider what activities are safe for you.  · Do not  lift anything that is heavier than 10 lb (4.5 kg), or the limit that you are told, until your health care provider says that it is safe.  General instructions  · Take over-the-counter and prescription medicines only as told by your health care provider.  · Do not take baths, swim, or use a hot tub until your health care provider approves. Ask your health care provider if you may take showers. You may only be allowed to take sponge baths.  · Do not drive for 24 hours if you were given a sedative during your procedure.  · Wear a medical alert bracelet in case of an emergency. This will tell any health care providers that you have a port.  · Keep all follow-up visits as told by your health care provider. This is important.  Contact a health care provider if:  · You cannot flush your port with saline as directed, or you cannot draw blood from the port.  · You have a fever or chills.  · You have redness, swelling, or pain around your port insertion site.  · You have fluid or blood coming from your port insertion site.  · Your port insertion site feels warm to the touch.  · You have pus or a bad smell coming from the port   insertion site.  Get help right away if:  · You have chest pain or shortness of breath.  · You have bleeding from your port that you cannot control.  Summary  · Take care of the port as told by your health care provider. Keep the manufacturer's information card with you at all times.  · Change your dressing as told by your health care provider.  · Contact a health care provider if you have a fever or chills or if you have redness, swelling, or pain around your port insertion site.  · Keep all follow-up visits as told by your health care provider.  This information is not intended to replace advice given to you by your health care provider. Make sure you discuss any questions you have with your health care provider.  Document Released: 08/11/2013 Document Revised: 05/19/2018 Document Reviewed:  05/19/2018  Elsevier Interactive Patient Education © 2019 [employer]

## 2019-03-02 NOTE — Patient Instructions (Signed)
[facility] Discharge Instructions for Patients Receiving Chemotherapy  Today you received the following chemotherapy agents Herceptin, Perjeta  To help prevent nausea and vomiting after your treatment, we encourage you to take your nausea medication    If you develop nausea and vomiting that is not controlled by your nausea medication, call the clinic.   BELOW ARE SYMPTOMS THAT SHOULD BE REPORTED IMMEDIATELY:  *FEVER GREATER THAN 100.5 F  *CHILLS WITH OR WITHOUT FEVER  NAUSEA AND VOMITING THAT IS NOT CONTROLLED WITH YOUR NAUSEA MEDICATION  *UNUSUAL SHORTNESS OF BREATH  *UNUSUAL BRUISING OR BLEEDING  TENDERNESS IN MOUTH AND THROAT WITH OR WITHOUT PRESENCE OF ULCERS  *URINARY PROBLEMS  *BOWEL PROBLEMS  UNUSUAL RASH Items with * indicate a potential emergency and should be followed up as soon as possible.  Feel free to call the clinic should you have any questions or concerns. The clinic phone number is [phone].  Please show the CHEMO ALERT CARD at check-in to the Emergency Department and triage nurse.   

## 2019-03-03 LAB — CANCER ANTIGEN 27.29: CA 27.29: 17.4 U/mL (ref 0.0–38.6)

## 2019-03-04 ENCOUNTER — Telehealth: Payer: Self-pay | Admitting: Physical Therapy

## 2019-03-04 NOTE — Telephone Encounter (Signed)
Called to follow up with patient after telehealth visit last week to teach MLD. She has been trying to do it a home with youtube video but states that she thinks she is having more feelings of fullness in her forearm.  Pt does not have a sleeve yet.  Discussed day and nighttime sleeves and Flexitouch compression pump. Pt wants to come into the clinic for a visit for in person instruction of MLD, measuring of arms and decision making about a day/night sleeves and/ or compression pump.   Maudry Diego, PT 03/04/2019 @ 4:09 PM

## 2019-03-10 ENCOUNTER — Encounter: Payer: Self-pay | Admitting: Hematology & Oncology

## 2019-03-11 ENCOUNTER — Ambulatory Visit: Admitting: Rehabilitation

## 2019-03-16 ENCOUNTER — Encounter (HOSPITAL_COMMUNITY): Admission: RE | Admit: 2019-03-16 | Source: Ambulatory Visit

## 2019-03-17 ENCOUNTER — Other Ambulatory Visit: Payer: Self-pay

## 2019-03-17 ENCOUNTER — Encounter: Payer: Self-pay | Admitting: Physical Therapy

## 2019-03-17 ENCOUNTER — Ambulatory Visit: Attending: Hematology & Oncology | Admitting: Physical Therapy

## 2019-03-17 DIAGNOSIS — M25612 Stiffness of left shoulder, not elsewhere classified: Secondary | ICD-10-CM | POA: Insufficient documentation

## 2019-03-17 DIAGNOSIS — I890 Lymphedema, not elsewhere classified: Secondary | ICD-10-CM | POA: Diagnosis present

## 2019-03-17 DIAGNOSIS — R293 Abnormal posture: Secondary | ICD-10-CM | POA: Insufficient documentation

## 2019-03-17 NOTE — Therapy (Signed)
[place], [place], [address] Phone: [phone]   Fax:  [phone]  Physical Therapy Treatment  Patient Details  Name: Taylor Flynn MRN: [id number] Date of Birth: 10-09-1976 Referring Provider (PT): Hale Bogus   Encounter Date: 03/17/2019  PT End of Session - 03/17/19 1917    Visit Number  3    Number of Visits  9    Date for PT Re-Evaluation  05/25/19    PT Start Time  5093    PT Stop Time  1511    PT Time Calculation (min)  59 min    Behavior During Therapy  [facility] for tasks assessed/performed       Past Medical History:  Diagnosis Date  . Breast cancer metastasized to liver, unspecified laterality ([place]) 10/08/2018  . 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 Narcolepsy   . Scoliosis    MILD  . Urge incontinence     Past Surgical History:  Procedure Laterality Date  . BREAST BIOPSY Left 01/2016  . CESAREAN SECTION  2012  . HERNIA REPAIR     ? Inguinal, but patient unsure.  Age 43  . MASS EXCISION Left 11/24/2018   Procedure: EXCISION NODULE LEFT CHEST WALL ERAS PATHWAY;  Surgeon: Fanny Skates, MD;  Location: [facility];  Service: General;  Laterality: Left;  . SENTINEL NODE BIOPSY Left 2017 APRIL    There were no vitals filed for this visit.  Subjective Assessment - 03/17/19 1418    Subjective  Pt wants to make sure she is doing the manual lymph drainage ok.     Pertinent History  left breast cancer metastasized to the liver, skin, and bone April 2017 metastasized Dec 2019, pt completed chemotherapy and is currently taking Herceptin and Projeta    Patient Stated Goals  to get the swelling down, improve lymphatic flow    Currently in Pain?  No/denies            LYMPHEDEMA/ONCOLOGY QUESTIONNAIRE - 03/17/19 1455      Right Upper Extremity Lymphedema   15 cm Proximal to Olecranon Process  24.7 cm    Olecranon Process  22.5 cm    15 cm Proximal to Ulnar Styloid  Process  22 cm    Just Proximal to Ulnar Styloid Process  14.5 cm    Across Hand at [employer]  18.8 cm    At [place] of 2nd Digit  5.5 cm      Left Upper Extremity Lymphedema   15 cm Proximal to Olecranon Process  25 cm    Olecranon Process  22.8 cm    15 cm Proximal to Ulnar Styloid Process  21 cm    Just Proximal to Ulnar Styloid Process  14.4 cm    Across Hand at [employer]  18.5 cm    At [place] of 2nd Digit  5.3 cm                [facility] Adult PT Treatment/Exercise - 03/17/19 0001      Self-Care   Other Self-Care Comments   pt givena piece of white foam with peach dotted foam to wear at anterior pec area under a close fitting shirt if she feels that she has fullness there .      Exercises   Exercises  Shoulder      Shoulder Exercises: Stretch   Other Shoulder Stretches  into flexion and abduction at the wall, pt acknoleges  she knows how to do them       Manual Therapy   Manual therapy comments  Pt acknowledged she knows how to do MLD.     Manual Lymphatic Drainage (MLD)  Performed and reviewed with patient: short neck, superfiicial and deep abdominals, left inguinal nodes, left axillo inguinal anastamosis, left anterior and posterior chest, left upper arm , right axillary nodes and anterior interaxillary anastamosis.              PT Education - 03/17/19 1917    Education Details  self manual lymph drainage, home exercise for shoulder stretch, use of compression patch at pec area     Person(s) Educated  Patient    Methods  Explanation;Demonstration    Comprehension  Verbalized understanding          PT Long Term Goals - 03/17/19 1923      PT LONG TERM GOAL #1   Title  Pt will obtain appropriate compression sleeve and gauntlet for long term management of lymphedema    Status  Deferred   pt does not need compression garment at this time      PT LONG TERM GOAL #2   Title  Pt will be independent in a home exercise program to increase end range L  shoulder ROM    Status  Achieved      PT LONG TERM GOAL #3   Title  Pt will be independent in self MLD for long term management of lymphedema    Status  Achieved            Plan - 03/17/19 1918    Clinical Impression Statement  Noted that pt has a mild scoliosis that she knows about the has anterior rotation of left rib cage that may be attributing to pt feeling that she has lympedema in her chest.  Her skin in is great contdition with moveable scar at anterior chest.  She possibly has some fullness at pec area ,but it may be due to the rotation of ribs. She said that she feels the fullness in worse at other times than right now, so she was given a foam patch to wear in a close fitting shirt for mild compression to this area.  There is no significant difference in the circumferences of her arms so feels she does not need a compression garment at this time. She is able to do self management of her symptoms and needs no futher PT.  Goals are met and she is pleased with her current status.  Pt knows that should symptoms recur, she can come back for more PT with a MD order     Rehab Potential  Excellent    PT Treatment/Interventions  ADLs/Self Care Home Management;Therapeutic activities;Therapeutic exercise;Manual techniques;Manual lymph drainage;Compression bandaging;Passive range of motion;Vasopneumatic Device;Taping    PT Next Visit Plan  Discharge this episode    Consulted and Agree with Plan of Care  Patient       Patient will benefit from skilled therapeutic intervention in order to improve the following deficits and impairments:  Increased edema, Decreased range of motion, Postural dysfunction, Increased fascial restricitons  Visit Diagnosis: Lymphedema, not elsewhere classified  Stiffness of left shoulder, not elsewhere classified  Abnormal posture     Problem List Patient Active Problem List   Diagnosis Date Noted  . Breast cancer metastasized to liver, unspecified  laterality ([place]) 10/08/2018  . 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, left ([place]) 10/08/2018  . Miscarriage 10/05/2018  .  Multigravida of advanced maternal age 23/12/2017  . Pruritus of vagina 10/05/2018  . 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 Angio-edema 09/25/2018  . Malignant neoplasm of overlapping sites of left breast in female, estrogen receptor negative ([place]) 10/21/2017    PHYSICAL THERAPY DISCHARGE SUMMARY  Visits from Start of Care: 3  Current functional level related to goals / functional outcomes: pt is independent in self management    Remaining deficits: Intermittent lymphedema   Education / Equipment: Self manual lymph drainage, home exercise  Plan: Patient agrees to discharge.  Patient goals were met. Patient is being discharged due to being pleased with the current functional level.  ?????    Donato Heinz. Owens Shark PT   Norwood Levo 03/17/2019, 7:25 PM  [place], [place], [address] Phone: [phone]   Fax:  [phone]  Name: Taylor Flynn MRN: [id number] Date of Birth: 04-06-1976

## 2019-03-20 ENCOUNTER — Other Ambulatory Visit: Payer: Self-pay | Admitting: Hematology & Oncology

## 2019-03-23 ENCOUNTER — Encounter: Payer: Self-pay | Admitting: Family

## 2019-03-23 ENCOUNTER — Other Ambulatory Visit: Payer: Self-pay

## 2019-03-23 ENCOUNTER — Inpatient Hospital Stay: Attending: Hematology & Oncology | Admitting: Family

## 2019-03-23 ENCOUNTER — Inpatient Hospital Stay

## 2019-03-23 VITALS — BP 108/64 | HR 59 | Temp 97.9°F | Resp 16 | Ht 63.5 in | Wt 115.0 lb

## 2019-03-23 DIAGNOSIS — Z79899 Other long term (current) drug therapy: Secondary | ICD-10-CM | POA: Diagnosis not present

## 2019-03-23 DIAGNOSIS — C50919 Malignant neoplasm of unspecified site of unspecified female breast: Secondary | ICD-10-CM

## 2019-03-23 DIAGNOSIS — Z5112 Encounter for antineoplastic immunotherapy: Secondary | ICD-10-CM | POA: Insufficient documentation

## 2019-03-23 DIAGNOSIS — C787 Secondary malignant neoplasm of liver and intrahepatic bile duct: Secondary | ICD-10-CM

## 2019-03-23 DIAGNOSIS — C773 Secondary and unspecified malignant neoplasm of axilla and upper limb lymph nodes: Secondary | ICD-10-CM | POA: Diagnosis not present

## 2019-03-23 DIAGNOSIS — Z171 Estrogen receptor negative status [ER-]: Secondary | ICD-10-CM

## 2019-03-23 DIAGNOSIS — C792 Secondary malignant neoplasm of skin: Secondary | ICD-10-CM

## 2019-03-23 DIAGNOSIS — D500 Iron deficiency anemia secondary to blood loss (chronic): Secondary | ICD-10-CM

## 2019-03-23 DIAGNOSIS — C50912 Malignant neoplasm of unspecified site of left female breast: Secondary | ICD-10-CM

## 2019-03-23 DIAGNOSIS — C50812 Malignant neoplasm of overlapping sites of left female breast: Secondary | ICD-10-CM

## 2019-03-23 LAB — CBC WITH DIFFERENTIAL (CANCER CENTER ONLY)
Abs Immature Granulocytes: 0.01 10*3/uL (ref 0.00–0.07)
Basophils Absolute: 0 10*3/uL (ref 0.0–0.1)
Basophils Relative: 1 %
Eosinophils Absolute: 0.1 10*3/uL (ref 0.0–0.5)
Eosinophils Relative: 2 %
HCT: 29.9 % — ABNORMAL LOW (ref 36.0–46.0)
Hemoglobin: 9.5 g/dL — ABNORMAL LOW (ref 12.0–15.0)
Immature Granulocytes: 0 %
Lymphocytes Relative: 37 %
Lymphs Abs: 1.4 10*3/uL (ref 0.7–4.0)
MCH: 28.4 pg (ref 26.0–34.0)
MCHC: 31.8 g/dL (ref 30.0–36.0)
MCV: 89.5 fL (ref 80.0–100.0)
Monocytes Absolute: 0.4 10*3/uL (ref 0.1–1.0)
Monocytes Relative: 10 %
Neutro Abs: 1.9 10*3/uL (ref 1.7–7.7)
Neutrophils Relative %: 50 %
Platelet Count: 223 10*3/uL (ref 150–400)
RBC: 3.34 MIL/uL — ABNORMAL LOW (ref 3.87–5.11)
RDW: 16.5 % — ABNORMAL HIGH (ref 11.5–15.5)
WBC Count: 3.8 10*3/uL — ABNORMAL LOW (ref 4.0–10.5)
nRBC: 0 % (ref 0.0–0.2)

## 2019-03-23 LAB — FERRITIN: Ferritin: 7 ng/mL — ABNORMAL LOW (ref 11–307)

## 2019-03-23 LAB — CMP (CANCER CENTER ONLY)
ALT: 12 U/L (ref 0–44)
AST: 19 U/L (ref 15–41)
Albumin: 4.1 g/dL (ref 3.5–5.0)
Alkaline Phosphatase: 45 U/L (ref 38–126)
Anion gap: 11 (ref 5–15)
BUN: 13 mg/dL (ref 6–20)
CO2: 25 mmol/L (ref 22–32)
Calcium: 8.9 mg/dL (ref 8.9–10.3)
Chloride: 103 mmol/L (ref 98–111)
Creatinine: 1.02 mg/dL — ABNORMAL HIGH (ref 0.44–1.00)
GFR, Est AFR Am: >60 mL/min (ref >60)
GFR, Estimated: >60 mL/min (ref >60)
Glucose, Bld: 65 mg/dL — ABNORMAL LOW (ref 70–99)
Potassium: 3.7 mmol/L (ref 3.5–5.1)
Sodium: 139 mmol/L (ref 135–145)
Total Bilirubin: 0.5 mg/dL (ref 0.3–1.2)
Total Protein: 6.3 g/dL — ABNORMAL LOW (ref 6.5–8.1)

## 2019-03-23 LAB — IRON AND TIBC
Iron: 128 ug/dL (ref 41–142)
Saturation Ratios: 34 % (ref 21–57)
TIBC: 379 ug/dL (ref 236–444)
UIBC: 251 ug/dL (ref 120–384)

## 2019-03-23 MED ORDER — SODIUM CHLORIDE 0.9 % IV SOLN
420.0000 mg | Freq: Once | INTRAVENOUS | Status: AC
  Administered 2019-03-23: 420 mg via INTRAVENOUS
  Filled 2019-03-23: qty 14

## 2019-03-23 MED ORDER — SODIUM CHLORIDE 0.9% FLUSH
10.0000 mL | INTRAVENOUS | Status: DC | PRN
  Administered 2019-03-23: 10 mL
  Filled 2019-03-23: qty 10

## 2019-03-23 MED ORDER — HEPARIN SOD (PORK) LOCK FLUSH 100 UNIT/ML IV SOLN
500.0000 [IU] | Freq: Once | INTRAVENOUS | Status: AC | PRN
  Administered 2019-03-23: 500 [IU]
  Filled 2019-03-23: qty 5

## 2019-03-23 MED ORDER — ZOLEDRONIC ACID 4 MG/100ML IV SOLN
4.0000 mg | Freq: Once | INTRAVENOUS | Status: AC
  Administered 2019-03-23: 4 mg via INTRAVENOUS
  Filled 2019-03-23: qty 100

## 2019-03-23 MED ORDER — DIPHENHYDRAMINE HCL 25 MG PO CAPS: 50.0000 mg | ORAL_CAPSULE | Freq: Once | ORAL | Status: DC

## 2019-03-23 MED ORDER — TRASTUZUMAB CHEMO 150 MG IV SOLR
300.0000 mg | Freq: Once | INTRAVENOUS | Status: AC
  Administered 2019-03-23: 300 mg via INTRAVENOUS
  Filled 2019-03-23: qty 14.29

## 2019-03-23 MED ORDER — SODIUM CHLORIDE 0.9 % IV SOLN
Freq: Once | INTRAVENOUS | Status: AC
  Administered 2019-03-23: 10:00:00 via INTRAVENOUS
  Filled 2019-03-23: qty 250

## 2019-03-23 MED ORDER — ACETAMINOPHEN 325 MG PO TABS: 650.0000 mg | ORAL_TABLET | Freq: Once | ORAL | Status: DC

## 2019-03-23 NOTE — Patient Instructions (Signed)
Implanted Port Insertion, Care After  This sheet gives you information about how to care for yourself after your procedure. Your health care provider may also give you more specific instructions. If you have problems or questions, contact your health care provider.  What can I expect after the procedure?  After the procedure, it is common to have:  · Discomfort at the port insertion site.  · Bruising on the skin over the port. This should improve over 3-4 days.  Follow these instructions at home:  Port care  · After your port is placed, you will get a manufacturer's information card. The card has information about your port. Keep this card with you at all times.  · Take care of the port as told by your health care provider. Ask your health care provider if you or a family member can get training for taking care of the port at home. A home health care nurse may also take care of the port.  · Make sure to remember what type of port you have.  Incision care         · Follow instructions from your health care provider about how to take care of your port insertion site. Make sure you:  ? Wash your hands with soap and water before and after you change your bandage (dressing). If soap and water are not available, use hand sanitizer.  ? Change your dressing as told by your health care provider.  ? Leave stitches (sutures), skin glue, or adhesive strips in place. These skin closures may need to stay in place for 2 weeks or longer. If adhesive strip edges start to loosen and curl up, you may trim the loose edges. Do not remove adhesive strips completely unless your health care provider tells you to do that.  · Check your port insertion site every day for signs of infection. Check for:  ? Redness, swelling, or pain.  ? Fluid or blood.  ? Warmth.  ? Pus or a bad smell.  Activity  · Return to your normal activities as told by your health care provider. Ask your health care provider what activities are safe for you.  · Do not  lift anything that is heavier than 10 lb (4.5 kg), or the limit that you are told, until your health care provider says that it is safe.  General instructions  · Take over-the-counter and prescription medicines only as told by your health care provider.  · Do not take baths, swim, or use a hot tub until your health care provider approves. Ask your health care provider if you may take showers. You may only be allowed to take sponge baths.  · Do not drive for 24 hours if you were given a sedative during your procedure.  · Wear a medical alert bracelet in case of an emergency. This will tell any health care providers that you have a port.  · Keep all follow-up visits as told by your health care provider. This is important.  Contact a health care provider if:  · You cannot flush your port with saline as directed, or you cannot draw blood from the port.  · You have a fever or chills.  · You have redness, swelling, or pain around your port insertion site.  · You have fluid or blood coming from your port insertion site.  · Your port insertion site feels warm to the touch.  · You have pus or a bad smell coming from the port   insertion site.  Get help right away if:  · You have chest pain or shortness of breath.  · You have bleeding from your port that you cannot control.  Summary  · Take care of the port as told by your health care provider. Keep the manufacturer's information card with you at all times.  · Change your dressing as told by your health care provider.  · Contact a health care provider if you have a fever or chills or if you have redness, swelling, or pain around your port insertion site.  · Keep all follow-up visits as told by your health care provider.  This information is not intended to replace advice given to you by your health care provider. Make sure you discuss any questions you have with your health care provider.  Document Released: 08/11/2013 Document Revised: 05/19/2018 Document Reviewed:  05/19/2018  Elsevier Interactive Patient Education © 2019 [employer]

## 2019-03-23 NOTE — Patient Instructions (Signed)
Pertuzumab injection What is this medicine? PERTUZUMAB (per TOOZ ue mab) is a monoclonal antibody. It is used to treat breast cancer. This medicine may be used for other purposes; ask your health care provider or pharmacist if you have questions. COMMON BRAND NAME(S): PERJETA What should I tell my health care provider before I take this medicine? They need to know if you have any of these conditions: -heart disease -heart failure -high blood pressure -history of irregular heart beat -recent or ongoing radiation therapy -an unusual or allergic reaction to pertuzumab, other medicines, foods, dyes, or preservatives -pregnant or trying to get pregnant -breast-feeding How should I use this medicine? This medicine is for infusion into a vein. It is given by a health care professional in a hospital or clinic setting. Talk to your pediatrician regarding the use of this medicine in children. Special care may be needed. Overdosage: If you think you have taken too much of this medicine contact a poison control center or emergency room at once. NOTE: This medicine is only for you. Do not share this medicine with others. What if I miss a dose? It is important not to miss your dose. Call your doctor or health care professional if you are unable to keep an appointment. What may interact with this medicine? Interactions are not expected. Give your health care provider a list of all the medicines, herbs, non-prescription drugs, or dietary supplements you use. Also tell them if you smoke, drink alcohol, or use illegal drugs. Some items may interact with your medicine. This list may not describe all possible interactions. Give your health care provider a list of all the medicines, herbs, non-prescription drugs, or dietary supplements you use. Also tell them if you smoke, drink alcohol, or use illegal drugs. Some items may interact with your medicine. What should I watch for while using this medicine? Your  condition will be monitored carefully while you are receiving this medicine. Report any side effects. Continue your course of treatment even though you feel ill unless your doctor tells you to stop. Do not become pregnant while taking this medicine or for 7 months after stopping it. Women should inform their doctor if they wish to become pregnant or think they might be pregnant. Women of child-bearing potential will need to have a negative pregnancy test before starting this medicine. There is a potential for serious side effects to an unborn child. Talk to your health care professional or pharmacist for more information. Do not breast-feed an infant while taking this medicine or for 7 months after stopping it. Women must use effective birth control with this medicine. Call your doctor or health care professional for advice if you get a fever, chills or sore throat, or other symptoms of a cold or flu. Do not treat yourself. Try to avoid being around people who are sick. You may experience fever, chills, and headache during the infusion. Report any side effects during the infusion to your health care professional. What side effects may I notice from receiving this medicine? Side effects that you should report to your doctor or health care professional as soon as possible: -breathing problems -chest pain or palpitations -dizziness -feeling faint or lightheaded -fever or chills -skin rash, itching or hives -sore throat -swelling of the face, lips, or tongue -swelling of the legs or ankles -unusually weak or tired Side effects that usually do not require medical attention (report to your doctor or health care professional if they continue or are bothersome): -diarrhea -hair   loss -nausea, vomiting -tiredness This list may not describe all possible side effects. Call your doctor for medical advice about side effects. You may report side effects to FDA at 1-800-FDA-1088. Where should I keep my  medicine? This drug is given in a hospital or clinic and will not be stored at home. NOTE: This sheet is a summary. It may not cover all possible information. If you have questions about this medicine, talk to your doctor, pharmacist, or health care provider.  2019 Elsevier/Gold Standard (2015-11-23 12:08:50) Trastuzumab injection for infusion What is this medicine? TRASTUZUMAB (tras TOO zoo mab) is a monoclonal antibody. It is used to treat breast cancer and stomach cancer. This medicine may be used for other purposes; ask your health care provider or pharmacist if you have questions. COMMON BRAND NAME(S): Herceptin, KANJINTI, OGIVRI What should I tell my health care provider before I take this medicine? They need to know if you have any of these conditions: -heart disease -heart failure -lung or breathing disease, like asthma -an unusual or allergic reaction to trastuzumab, benzyl alcohol, or other medications, foods, dyes, or preservatives -pregnant or trying to get pregnant -breast-feeding How should I use this medicine? This drug is given as an infusion into a vein. It is administered in a hospital or clinic by a specially trained health care professional. Talk to your pediatrician regarding the use of this medicine in children. This medicine is not approved for use in children. Overdosage: If you think you have taken too much of this medicine contact a poison control center or emergency room at once. NOTE: This medicine is only for you. Do not share this medicine with others. What if I miss a dose? It is important not to miss a dose. Call your doctor or health care professional if you are unable to keep an appointment. What may interact with this medicine? This medicine may interact with the following medications: -certain types of chemotherapy, such as daunorubicin, doxorubicin, epirubicin, and idarubicin This list may not describe all possible interactions. Give your health care  provider a list of all the medicines, herbs, non-prescription drugs, or dietary supplements you use. Also tell them if you smoke, drink alcohol, or use illegal drugs. Some items may interact with your medicine. What should I watch for while using this medicine? Visit your doctor for checks on your progress. Report any side effects. Continue your course of treatment even though you feel ill unless your doctor tells you to stop. Call your doctor or health care professional for advice if you get a fever, chills or sore throat, or other symptoms of a cold or flu. Do not treat yourself. Try to avoid being around people who are sick. You may experience fever, chills and shaking during your first infusion. These effects are usually mild and can be treated with other medicines. Report any side effects during the infusion to your health care professional. Fever and chills usually do not happen with later infusions. Do not become pregnant while taking this medicine or for 7 months after stopping it. Women should inform their doctor if they wish to become pregnant or think they might be pregnant. Women of child-bearing potential will need to have a negative pregnancy test before starting this medicine. There is a potential for serious side effects to an unborn child. Talk to your health care professional or pharmacist for more information. Do not breast-feed an infant while taking this medicine or for 7 months after stopping it. Women must use effective   birth control with this medicine. What side effects may I notice from receiving this medicine? Side effects that you should report to your doctor or health care professional as soon as possible: -allergic reactions like skin rash, itching or hives, swelling of the face, lips, or tongue -chest pain or palpitations -cough -dizziness -feeling faint or lightheaded, falls -fever -general ill feeling or flu-like symptoms -signs of worsening heart failure like  breathing problems; swelling in your legs and feet -unusually weak or tired Side effects that usually do not require medical attention (report to your doctor or health care professional if they continue or are bothersome): -bone pain -changes in taste -diarrhea -joint pain -nausea/vomiting -weight loss This list may not describe all possible side effects. Call your doctor for medical advice about side effects. You may report side effects to FDA at 1-800-FDA-1088. Where should I keep my medicine? This drug is given in a hospital or clinic and will not be stored at home. NOTE: This sheet is a summary. It may not cover all possible information. If you have questions about this medicine, talk to your doctor, pharmacist, or health care provider.  2019 Elsevier/Gold Standard (2016-10-15 14:37:52)  

## 2019-03-23 NOTE — Progress Notes (Signed)
Hematology and Oncology Follow Up Visit  Taylor Flynn [id number] 19-Nov-1975 43 y.o. 03/23/2019   Principle Diagnosis:  Metastatic breast cancer-ER-/HER-2+  Current Therapy:   Herceptin/Perjeta- s/p cycle 7 (previous cycles given in [place]) Zometa 4 mg IV q 2 monthsdue again in May   Interim History:  Taylor Flynn is here today for follow-up and treatment. She is doing well and is staying busy home schooling her daughter. She also plans to start doing tele psych health 2 days a week.  Her left abdominal wall incision has healed nicely and is dry and intact.  She was holding off on having the PET due to concern with the radiation. She has researched a new MRI machine that is located in [place] and also [place] but states that she thinks her tumors are too small to show up so she states she will have the PET scan in June or July.   No fever, chills, n/v, cough, rash, dizziness, SOB, chest pain, palpitations, abdominal pain or changes in bowel or bladder habits.  No episodes of bleeding, no bruising or petechiae.  No swelling, tenderness, numbness or tingling in her extremities at this time.  No lymphadenopathy noted on exam. She is currently fasting with her herbalist and BG was a little low at 65. She states that she is feeling fine and will be able to juice and have some bone broth to help bring this up. She finishes the fast tomorrow. She states that she has a good appetite. Her weight is stable.  She is drinking coconut water which seems to make her feel more hydrated.   ECOG Performance Status: 1 - Symptomatic but completely ambulatory  Medications:  Allergies as of 03/23/2019      Reactions   Aspirin Swelling   Angioedema    Sulfa Antibiotics Rash      Medication List       Accurate as of Mar 23, 2019  9:25 AM. If you have any questions, ask your nurse or doctor.        bicalutamide 50 MG tablet Commonly known as:  CASODEX Take 150 mg by mouth daily.   COQ10 PO  Take 1 Dose by mouth 2 (two) times daily.   cyclophosphamide 50 MG capsule Commonly known as:  CYTOXAN Take 50 mg by mouth every evening.   desloratadine 5 MG tablet Commonly known as:  CLARINEX Take 5 mg by mouth daily as needed.   EPINEPHrine 0.3 mg/0.3 mL Soaj injection Commonly known as:  EPI-PEN Inject 0.3 mg into the muscle as needed for anaphylaxis.   EUROPEAN MISTLETOE [place] Inject 100 mg into the skin every Monday, Wednesday, and Friday.   HERCEPTIN IV Inject 1 Dose into the vein every 21 ( twenty-one) days. Combined with Perjeta   ivermectin 3 MG Tabs tablet Commonly known as:  STROMECTOL Take 6 mg by mouth daily.   methotrexate 2.5 MG tablet Commonly known as:  RHEUMATREX Take 2.5 mg by mouth 2 (two) times a week. Caution:Chemotherapy. Protect from light. Sundays and Wednesdays   NALTREXONE HCL PO Take 4.5 mg by mouth See admin instructions. Take 4.5 mg by mouth at night Wen through [place] Take 1-2 capsules by mouth See admin instructions. lumbrokinase - Take 1 capsule in the morning 2 capsules at lunch and 1 capsule at bedtime   OVER THE COUNTER MEDICATION Take 1 Dose by mouth at bedtime. cbd oil   OVER THE COUNTER MEDICATION Take 4 capsules by mouth  See admin instructions. fenbencur take 4 capsules by mouth at bedtime Sunday through Tuesday   OVER THE COUNTER MEDICATION Take 3 drops by mouth 3 (three) times daily. immunis   OVER THE COUNTER MEDICATION Take 3 drops by mouth 3 (three) times daily. Phung-ex   OVER THE COUNTER MEDICATION Take 1 Dose by mouth daily. Lymph-tone 2   OVER THE COUNTER MEDICATION Take 1 Dose by mouth daily. Pineal code   OVER THE COUNTER MEDICATION Take 30 mLs by mouth 2 (two) times daily. Haelen   PERJETA IV Inject 1 Dose into the vein every 21 ( twenty-one) days. Combined with Herceptin   Probiotic Caps Take 2 capsules by mouth every other day. Daily with a meal   VITAMIN D-VITAMIN K PO Place  5 drops under the tongue daily.   ZOMETA IV Inject 1 Dose into the vein every 6 (six) weeks.       Allergies:  Allergies  Allergen Reactions  . Aspirin Swelling    Angioedema   . Sulfa Antibiotics Rash    Past Medical History, Surgical history, Social history, and Family History were reviewed and updated.  Review of Systems: All other 10 point review of systems is negative.   Physical Exam:  vitals were not taken for this visit.   Wt Readings from Last 3 Encounters:  03/23/19 115 lb (52.2 kg)  01/19/19 119 lb (54 kg)  12/29/18 120 lb (54.4 kg)    Ocular: Sclerae unicteric, pupils equal, round and reactive to light Ear-nose-throat: Oropharynx clear, dentition fair Lymphatic: No cervical or supraclavicular adenopathy Lungs no rales or rhonchi, good excursion bilaterally Heart regular rate and rhythm, no murmur appreciated Abd soft, nontender, positive bowel sounds, no liver or spleen tip palpated on exam, no fluid wave  MSK no focal spinal tenderness, no joint edema Neuro: non-focal, well-oriented, appropriate affect Breasts: No changes on today's exam, no lesion or rash noted.   Lab Results  Component Value Date   WBC 3.5 (L) 03/02/2019   HGB 9.2 (L) 03/02/2019   HCT 29.2 (L) 03/02/2019   MCV 89.0 03/02/2019   PLT 225 03/02/2019   Lab Results  Component Value Date   FERRITIN 9 (L) 03/02/2019   IRON 99 03/02/2019   TIBC 401 03/02/2019   UIBC 303 03/02/2019   IRONPCTSAT 25 03/02/2019   Lab Results  Component Value Date   RETICCTPCT 0.8 11/17/2018   RBC 3.28 (L) 03/02/2019   No results found for: KPAFRELGTCHN, LAMBDASER, KAPLAMBRATIO No results found for: IGGSERUM, IGA, IGMSERUM No results found for: Ronnald Ramp, A1GS, A2GS, Arnaldo Natal, GAMS, MSPIKE, SPEI   Chemistry      Component Value Date/Time   NA 140 03/02/2019 1000   K 4.1 03/02/2019 1000   CL 105 03/02/2019 1000   CO2 28 03/02/2019 1000   BUN 13 03/02/2019 1000   CREATININE  1.13 (H) 03/02/2019 1000      Component Value Date/Time   CALCIUM 9.5 03/02/2019 1000   ALKPHOS 48 03/02/2019 1000   AST 18 03/02/2019 1000   ALT 10 03/02/2019 1000   BILITOT 0.4 03/02/2019 1000       Impression and Plan: Taylor Flynn is a very pleasant 43 yo African American female with metastatic breast cancer.  CA 27.29 at her last visit was 17.4. Today's result is pending.  We will proceed with Herceptin and Perjeta today as planned.  We will get her set up for an ECHO in the next couple weeks.  She will be  ready to schedule her PET scan in June or July.  We will see her back in another 3 weeks.  She will contact our office with any questions or concerns. We can certainly see her sooner if need be.   [name], NP 5/19/20209:25 AM

## 2019-03-24 ENCOUNTER — Telehealth: Payer: Self-pay | Admitting: Family

## 2019-03-24 LAB — CANCER ANTIGEN 27.29: CA 27.29: 10.3 U/mL (ref 0.0–38.6)

## 2019-03-24 NOTE — Telephone Encounter (Signed)
Called and spoke with patient regarding appointments added and ECHO appt . Per 5/19 los

## 2019-03-30 ENCOUNTER — Other Ambulatory Visit: Payer: Self-pay

## 2019-03-30 ENCOUNTER — Ambulatory Visit (HOSPITAL_BASED_OUTPATIENT_CLINIC_OR_DEPARTMENT_OTHER)
Admission: RE | Admit: 2019-03-30 | Discharge: 2019-03-30 | Disposition: A | Discharge status: Home / Self Care | Source: Ambulatory Visit | Attending: Family | Admitting: Family

## 2019-03-30 DIAGNOSIS — C50912 Malignant neoplasm of unspecified site of left female breast: Secondary | ICD-10-CM | POA: Insufficient documentation

## 2019-03-30 DIAGNOSIS — C787 Secondary malignant neoplasm of liver and intrahepatic bile duct: Secondary | ICD-10-CM | POA: Insufficient documentation

## 2019-03-30 DIAGNOSIS — C50919 Malignant neoplasm of unspecified site of unspecified female breast: Secondary | ICD-10-CM | POA: Insufficient documentation

## 2019-03-30 DIAGNOSIS — C792 Secondary malignant neoplasm of skin: Secondary | ICD-10-CM | POA: Insufficient documentation

## 2019-03-30 NOTE — Progress Notes (Signed)
  Echocardiogram 2D Echocardiogram has been performed.  [name] 03/30/2019, 3:09 PM

## 2019-03-31 ENCOUNTER — Encounter: Payer: Self-pay | Admitting: *Deleted

## 2019-04-14 ENCOUNTER — Ambulatory Visit: Admitting: Family

## 2019-04-14 ENCOUNTER — Other Ambulatory Visit

## 2019-04-14 ENCOUNTER — Ambulatory Visit

## 2019-04-21 ENCOUNTER — Other Ambulatory Visit: Payer: Self-pay

## 2019-04-21 ENCOUNTER — Inpatient Hospital Stay: Attending: Hematology & Oncology | Admitting: Family

## 2019-04-21 ENCOUNTER — Inpatient Hospital Stay

## 2019-04-21 ENCOUNTER — Other Ambulatory Visit: Payer: Self-pay | Admitting: Family

## 2019-04-21 ENCOUNTER — Encounter: Payer: Self-pay | Admitting: Family

## 2019-04-21 VITALS — BP 122/73 | HR 62 | Resp 17 | Ht 63.0 in | Wt 116.0 lb

## 2019-04-21 DIAGNOSIS — C787 Secondary malignant neoplasm of liver and intrahepatic bile duct: Secondary | ICD-10-CM

## 2019-04-21 DIAGNOSIS — C50812 Malignant neoplasm of overlapping sites of left female breast: Secondary | ICD-10-CM

## 2019-04-21 DIAGNOSIS — C50919 Malignant neoplasm of unspecified site of unspecified female breast: Secondary | ICD-10-CM

## 2019-04-21 DIAGNOSIS — C50912 Malignant neoplasm of unspecified site of left female breast: Secondary | ICD-10-CM

## 2019-04-21 DIAGNOSIS — Z171 Estrogen receptor negative status [ER-]: Secondary | ICD-10-CM

## 2019-04-21 DIAGNOSIS — D500 Iron deficiency anemia secondary to blood loss (chronic): Secondary | ICD-10-CM

## 2019-04-21 DIAGNOSIS — Z5112 Encounter for antineoplastic immunotherapy: Secondary | ICD-10-CM | POA: Insufficient documentation

## 2019-04-21 DIAGNOSIS — Z79899 Other long term (current) drug therapy: Secondary | ICD-10-CM | POA: Diagnosis not present

## 2019-04-21 DIAGNOSIS — C773 Secondary and unspecified malignant neoplasm of axilla and upper limb lymph nodes: Secondary | ICD-10-CM | POA: Insufficient documentation

## 2019-04-21 DIAGNOSIS — C792 Secondary malignant neoplasm of skin: Secondary | ICD-10-CM

## 2019-04-21 LAB — CMP (CANCER CENTER ONLY)
ALT: 14 U/L (ref 0–44)
AST: 21 U/L (ref 15–41)
Albumin: 4.3 g/dL (ref 3.5–5.0)
Alkaline Phosphatase: 53 U/L (ref 38–126)
Anion gap: 5 (ref 5–15)
BUN: 11 mg/dL (ref 6–20)
CO2: 28 mmol/L (ref 22–32)
Calcium: 9.4 mg/dL (ref 8.9–10.3)
Chloride: 107 mmol/L (ref 98–111)
Creatinine: 0.97 mg/dL (ref 0.44–1.00)
GFR, Est AFR Am: >60 mL/min (ref >60)
GFR, Estimated: >60 mL/min (ref >60)
Glucose, Bld: 80 mg/dL (ref 70–99)
Potassium: 4.1 mmol/L (ref 3.5–5.1)
Sodium: 140 mmol/L (ref 135–145)
Total Bilirubin: 0.3 mg/dL (ref 0.3–1.2)
Total Protein: 6.7 g/dL (ref 6.5–8.1)

## 2019-04-21 LAB — CBC WITH DIFFERENTIAL (CANCER CENTER ONLY)
Abs Immature Granulocytes: 0.01 10*3/uL (ref 0.00–0.07)
Basophils Absolute: 0 10*3/uL (ref 0.0–0.1)
Basophils Relative: 1 %
Eosinophils Absolute: 0.1 10*3/uL (ref 0.0–0.5)
Eosinophils Relative: 2 %
HCT: 30 % — ABNORMAL LOW (ref 36.0–46.0)
Hemoglobin: 9.4 g/dL — ABNORMAL LOW (ref 12.0–15.0)
Immature Granulocytes: 0 %
Lymphocytes Relative: 27 %
Lymphs Abs: 1.1 10*3/uL (ref 0.7–4.0)
MCH: 28.3 pg (ref 26.0–34.0)
MCHC: 31.3 g/dL (ref 30.0–36.0)
MCV: 90.4 fL (ref 80.0–100.0)
Monocytes Absolute: 0.4 10*3/uL (ref 0.1–1.0)
Monocytes Relative: 10 %
Neutro Abs: 2.5 10*3/uL (ref 1.7–7.7)
Neutrophils Relative %: 60 %
Platelet Count: 232 10*3/uL (ref 150–400)
RBC: 3.32 MIL/uL — ABNORMAL LOW (ref 3.87–5.11)
RDW: 15.9 % — ABNORMAL HIGH (ref 11.5–15.5)
WBC Count: 4.2 10*3/uL (ref 4.0–10.5)
nRBC: 0 % (ref 0.0–0.2)

## 2019-04-21 LAB — IRON AND TIBC
Iron: 33 ug/dL — ABNORMAL LOW (ref 41–142)
Saturation Ratios: 8 % — ABNORMAL LOW (ref 21–57)
TIBC: 406 ug/dL (ref 236–444)
UIBC: 374 ug/dL (ref 120–384)

## 2019-04-21 LAB — FERRITIN: Ferritin: 5 ng/mL — ABNORMAL LOW (ref 11–307)

## 2019-04-21 MED ORDER — HEPARIN SOD (PORK) LOCK FLUSH 100 UNIT/ML IV SOLN
500.0000 [IU] | Freq: Once | INTRAVENOUS | Status: AC | PRN
  Administered 2019-04-21: 500 [IU]
  Filled 2019-04-21: qty 5

## 2019-04-21 MED ORDER — DIPHENHYDRAMINE HCL 25 MG PO CAPS: 50.0000 mg | ORAL_CAPSULE | Freq: Once | ORAL | Status: DC

## 2019-04-21 MED ORDER — TRASTUZUMAB CHEMO 150 MG IV SOLR
300.0000 mg | Freq: Once | INTRAVENOUS | Status: AC
  Administered 2019-04-21: 300 mg via INTRAVENOUS
  Filled 2019-04-21: qty 14.29

## 2019-04-21 MED ORDER — SODIUM CHLORIDE 0.9 % IV SOLN
420.0000 mg | Freq: Once | INTRAVENOUS | Status: AC
  Administered 2019-04-21: 420 mg via INTRAVENOUS
  Filled 2019-04-21: qty 14

## 2019-04-21 MED ORDER — SODIUM CHLORIDE 0.9% FLUSH
10.0000 mL | Freq: Once | INTRAVENOUS | Status: AC | PRN
  Administered 2019-04-21: 10 mL
  Filled 2019-04-21: qty 10

## 2019-04-21 MED ORDER — SODIUM CHLORIDE 0.9 % IV SOLN
Freq: Once | INTRAVENOUS | Status: AC
  Administered 2019-04-21: 11:00:00 via INTRAVENOUS
  Filled 2019-04-21: qty 250

## 2019-04-21 MED ORDER — ACETAMINOPHEN 325 MG PO TABS: 650.0000 mg | ORAL_TABLET | Freq: Once | ORAL | Status: DC

## 2019-04-21 MED ORDER — SODIUM CHLORIDE 0.9% FLUSH
10.0000 mL | INTRAVENOUS | Status: DC | PRN
  Administered 2019-04-21: 10 mL
  Filled 2019-04-21: qty 10

## 2019-04-21 NOTE — Progress Notes (Signed)
Hematology and Oncology Follow Up Visit  Taylor Flynn [id number] 12-17-1975 43 y.o. 04/21/2019   Principle Diagnosis:  Metastatic breast cancer-ER-/HER-2+  Current Therapy:   Herceptin/Perjeta- s/p cycle 7(previous cycles given in [place]) Zometa 4 mg IV q 2 monthsdue again in May   Interim History:  Ms. Taylor Flynn is here today for follow-up and treatment. She is doing well and states that she can tell that her fatigue has improved.  She has had some abdominal bloating and took charcoal yesterday to see if this would help. Abdomen is soft and non tender on exam today. She has had constipation in the past and has noted that she has abdominal discomfort when she eats rice or other heavy grains. She is considering following up with her colonic provider.  She has hemorrhoids and when she strains she has noted small streaks of bright red blood on her toilet tissue.  He CA 27.29 was down to 10.3 in May. She is ready to move forward with her PET scan this month.  No fever, chills, n/v, cough, rash, dizziness, SOB, chest pain, palpitations or changes in bowel or bladder habits.  She has puffiness that comes and goes in the ankles. No pitting edema noted on exam. Pedal pulses are 2+.  No lymphadenopathy noted on exam.  She has occasional numbness in her right 3 and 4th toes as well as in the back of her left arm.  She states that lymphedema therapy has helped and she has noticed a reduction in fluid retention.  She has not had a cycle since February.  She is eating well and staying hydrated. She only occasionally eats salmon and tune. No other meats. Her weight is stable.   ECOG Performance Status: 1 - Symptomatic but completely ambulatory  Medications:  Allergies as of 04/21/2019      Reactions   Aspirin Swelling   Angioedema    Sulfa Antibiotics Rash      Medication List       Accurate as of April 21, 2019 10:18 AM. If you have any questions, ask your nurse or doctor.        STOP  taking these medications   OVER THE COUNTER MEDICATION Stopped by: [name], NP   OVER THE COUNTER MEDICATION Stopped by: [name], NP   OVER THE [place] by: [name], NP   OVER THE COUNTER MEDICATION Stopped by: [name], NP     TAKE these medications   bicalutamide 50 MG tablet Commonly known as: CASODEX Take 150 mg by mouth daily.   COQ10 PO Take 1 Dose by mouth 2 (two) times daily.   cyclophosphamide 50 MG capsule Commonly known as: CYTOXAN Take 50 mg by mouth every evening.   EPINEPHrine 0.3 mg/0.3 mL Soaj injection Commonly known as: EPI-PEN Inject 0.3 mg into the muscle as needed for anaphylaxis.   EUROPEAN MISTLETOE [place] Inject 100 mg into the skin every Monday, Wednesday, and Friday.   HERCEPTIN IV Inject 1 Dose into the vein every 21 ( twenty-one) days. Combined with Perjeta   ivermectin 3 MG Tabs tablet Commonly known as: STROMECTOL Take 6 mg by mouth daily.   NALTREXONE HCL PO Take 4.5 mg by mouth See admin instructions. Take 4.5 mg by mouth at night Wen through [place] Take 1-2 capsules by mouth See admin instructions. lumbrokinase - Take 1 capsule in the morning 2 capsules at lunch and 1 capsule at bedtime   PERJETA IV Inject  1 Dose into the vein every 21 ( twenty-one) days. Combined with Herceptin   Probiotic Caps Take 2 capsules by mouth every other day. Daily with a meal   VITAMIN D-VITAMIN K PO Place 5 drops under the tongue daily.   ZOMETA IV Inject 1 Dose into the vein every 6 (six) weeks.       Allergies:  Allergies  Allergen Reactions  . Aspirin Swelling    Angioedema   . Sulfa Antibiotics Rash    Past Medical History, Surgical history, Social history, and Family History were reviewed and updated.  Review of Systems: All other 10 point review of systems is negative.   Physical Exam:  vitals were not taken for this visit.   Wt Readings from Last  3 Encounters:  03/23/19 115 lb 0.6 oz (52.2 kg)  03/23/19 115 lb (52.2 kg)  01/19/19 119 lb (54 kg)    Ocular: Sclerae unicteric, pupils equal, round and reactive to light Ear-nose-throat: Oropharynx clear, dentition fair Lymphatic: No cervical or supraclavicular adenopathy Lungs no rales or rhonchi, good excursion bilaterally Heart regular rate and rhythm, no murmur appreciated Abd soft, nontender, positive bowel sounds, no liver or spleen tip palpated on exam, no fluid wave  MSK no focal spinal tenderness, no joint edema Neuro: non-focal, well-oriented, appropriate affect Breasts: No changes to chest, no mass or rash noted.   Lab Results  Component Value Date   WBC 4.2 04/21/2019   HGB 9.4 (L) 04/21/2019   HCT 30.0 (L) 04/21/2019   MCV 90.4 04/21/2019   PLT 232 04/21/2019   Lab Results  Component Value Date   FERRITIN 7 (L) 03/23/2019   IRON 128 03/23/2019   TIBC 379 03/23/2019   UIBC 251 03/23/2019   IRONPCTSAT 34 03/23/2019   Lab Results  Component Value Date   RETICCTPCT 0.8 11/17/2018   RBC 3.32 (L) 04/21/2019   No results found for: KPAFRELGTCHN, LAMBDASER, KAPLAMBRATIO No results found for: IGGSERUM, IGA, IGMSERUM No results found for: Ronnald Ramp, A1GS, A2GS, BETS, BETA2SER, GAMS, MSPIKE, SPEI   Chemistry      Component Value Date/Time   NA 139 03/23/2019 0900   K 3.7 03/23/2019 0900   CL 103 03/23/2019 0900   CO2 25 03/23/2019 0900   BUN 13 03/23/2019 0900   CREATININE 1.02 (H) 03/23/2019 0900      Component Value Date/Time   CALCIUM 8.9 03/23/2019 0900   ALKPHOS 45 03/23/2019 0900   AST 19 03/23/2019 0900   ALT 12 03/23/2019 0900   BILITOT 0.5 03/23/2019 0900       Impression and Plan:  Ms. Taylor Flynn is a very pleasant 43 yo African American female with metastatic breast cancer.  CA 27.29 in May was down to 10.3 Today's result is pending.  We will get her PET scan scheduled for in the next 2 weeks.  We will proceed with treatment  today as planned.  We will see what her iron studies show and bring her back in for infusion if needed.  We will plan to see her back in another 3 weeks.  She will contact our office with any questions or concerns. We can certainly see her sooner if need be.   [name], NP 6/17/202010:18 AM

## 2019-04-21 NOTE — Patient Instructions (Signed)
Trastuzumab injection for infusion  What is this medicine?  TRASTUZUMAB (tras TOO zoo mab) is a monoclonal antibody. It is used to treat breast cancer and stomach cancer.  This medicine may be used for other purposes; ask your health care provider or pharmacist if you have questions.  COMMON BRAND NAME(S): Herceptin, KANJINTI, OGIVRI  What should I tell my health care provider before I take this medicine?  They need to know if you have any of these conditions:  -heart disease  -heart failure  -lung or breathing disease, like asthma  -an unusual or allergic reaction to trastuzumab, benzyl alcohol, or other medications, foods, dyes, or preservatives  -pregnant or trying to get pregnant  -breast-feeding  How should I use this medicine?  This drug is given as an infusion into a vein. It is administered in a hospital or clinic by a specially trained health care professional.  Talk to your pediatrician regarding the use of this medicine in children. This medicine is not approved for use in children.  Overdosage: If you think you have taken too much of this medicine contact a poison control center or emergency room at once.  NOTE: This medicine is only for you. Do not share this medicine with others.  What if I miss a dose?  It is important not to miss a dose. Call your doctor or health care professional if you are unable to keep an appointment.  What may interact with this medicine?  This medicine may interact with the following medications:  -certain types of chemotherapy, such as daunorubicin, doxorubicin, epirubicin, and idarubicin  This list may not describe all possible interactions. Give your health care provider a list of all the medicines, herbs, non-prescription drugs, or dietary supplements you use. Also tell them if you smoke, drink alcohol, or use illegal drugs. Some items may interact with your medicine.  What should I watch for while using this medicine?  Visit your doctor for checks on your progress. Report  any side effects. Continue your course of treatment even though you feel ill unless your doctor tells you to stop.  Call your doctor or health care professional for advice if you get a fever, chills or sore throat, or other symptoms of a cold or flu. Do not treat yourself. Try to avoid being around people who are sick.  You may experience fever, chills and shaking during your first infusion. These effects are usually mild and can be treated with other medicines. Report any side effects during the infusion to your health care professional. Fever and chills usually do not happen with later infusions.  Do not become pregnant while taking this medicine or for 7 months after stopping it. Women should inform their doctor if they wish to become pregnant or think they might be pregnant. Women of child-bearing potential will need to have a negative pregnancy test before starting this medicine. There is a potential for serious side effects to an unborn child. Talk to your health care professional or pharmacist for more information. Do not breast-feed an infant while taking this medicine or for 7 months after stopping it.  Women must use effective birth control with this medicine.  What side effects may I notice from receiving this medicine?  Side effects that you should report to your doctor or health care professional as soon as possible:  -allergic reactions like skin rash, itching or hives, swelling of the face, lips, or tongue  -chest pain or palpitations  -cough  -dizziness  -  feeling faint or lightheaded, falls  -fever  -general ill feeling or flu-like symptoms  -signs of worsening heart failure like breathing problems; swelling in your legs and feet  -unusually weak or tired  Side effects that usually do not require medical attention (report to your doctor or health care professional if they continue or are bothersome):  -bone pain  -changes in taste  -diarrhea  -joint pain  -nausea/vomiting  -weight loss  This list may  not describe all possible side effects. Call your doctor for medical advice about side effects. You may report side effects to FDA at 1-800-FDA-1088.  Where should I keep my medicine?  This drug is given in a hospital or clinic and will not be stored at home.  NOTE: This sheet is a summary. It may not cover all possible information. If you have questions about this medicine, talk to your doctor, pharmacist, or health care provider.   2019 Elsevier/Gold Standard (2016-10-15 14:37:52)  Pertuzumab injection  What is this medicine?  PERTUZUMAB (per TOOZ ue mab) is a monoclonal antibody. It is used to treat breast cancer.  This medicine may be used for other purposes; ask your health care provider or pharmacist if you have questions.  COMMON BRAND NAME(S): PERJETA  What should I tell my health care provider before I take this medicine?  They need to know if you have any of these conditions:  -heart disease  -heart failure  -high blood pressure  -history of irregular heart beat  -recent or ongoing radiation therapy  -an unusual or allergic reaction to pertuzumab, other medicines, foods, dyes, or preservatives  -pregnant or trying to get pregnant  -breast-feeding  How should I use this medicine?  This medicine is for infusion into a vein. It is given by a health care professional in a hospital or clinic setting.  Talk to your pediatrician regarding the use of this medicine in children. Special care may be needed.  Overdosage: If you think you have taken too much of this medicine contact a poison control center or emergency room at once.  NOTE: This medicine is only for you. Do not share this medicine with others.  What if I miss a dose?  It is important not to miss your dose. Call your doctor or health care professional if you are unable to keep an appointment.  What may interact with this medicine?  Interactions are not expected.  Give your health care provider a list of all the medicines, herbs, non-prescription drugs,  or dietary supplements you use. Also tell them if you smoke, drink alcohol, or use illegal drugs. Some items may interact with your medicine.  This list may not describe all possible interactions. Give your health care provider a list of all the medicines, herbs, non-prescription drugs, or dietary supplements you use. Also tell them if you smoke, drink alcohol, or use illegal drugs. Some items may interact with your medicine.  What should I watch for while using this medicine?  Your condition will be monitored carefully while you are receiving this medicine. Report any side effects. Continue your course of treatment even though you feel ill unless your doctor tells you to stop.  Do not become pregnant while taking this medicine or for 7 months after stopping it. Women should inform their doctor if they wish to become pregnant or think they might be pregnant. Women of child-bearing potential will need to have a negative pregnancy test before starting this medicine. There is a potential for   serious side effects to an unborn child. Talk to your health care professional or pharmacist for more information. Do not breast-feed an infant while taking this medicine or for 7 months after stopping it.  Women must use effective birth control with this medicine.  Call your doctor or health care professional for advice if you get a fever, chills or sore throat, or other symptoms of a cold or flu. Do not treat yourself. Try to avoid being around people who are sick.  You may experience fever, chills, and headache during the infusion. Report any side effects during the infusion to your health care professional.  What side effects may I notice from receiving this medicine?  Side effects that you should report to your doctor or health care professional as soon as possible:  -breathing problems  -chest pain or palpitations  -dizziness  -feeling faint or lightheaded  -fever or chills  -skin rash, itching or hives  -sore  throat  -swelling of the face, lips, or tongue  -swelling of the legs or ankles  -unusually weak or tired  Side effects that usually do not require medical attention (report to your doctor or health care professional if they continue or are bothersome):  -diarrhea  -hair loss  -nausea, vomiting  -tiredness  This list may not describe all possible side effects. Call your doctor for medical advice about side effects. You may report side effects to FDA at 1-800-FDA-1088.  Where should I keep my medicine?  This drug is given in a hospital or clinic and will not be stored at home.  NOTE: This sheet is a summary. It may not cover all possible information. If you have questions about this medicine, talk to your doctor, pharmacist, or health care provider.   2019 Elsevier/Gold Standard (2015-11-23 12:08:50)

## 2019-04-21 NOTE — Patient Instructions (Signed)

## 2019-04-22 LAB — CANCER ANTIGEN 27.29: CA 27.29: 13.3 U/mL (ref 0.0–38.6)

## 2019-04-28 ENCOUNTER — Other Ambulatory Visit: Payer: Self-pay

## 2019-04-28 ENCOUNTER — Telehealth: Payer: Self-pay | Admitting: *Deleted

## 2019-04-28 ENCOUNTER — Ambulatory Visit (HOSPITAL_COMMUNITY)
Admission: RE | Admit: 2019-04-28 | Discharge: 2019-04-28 | Disposition: A | Discharge status: Home / Self Care | Source: Ambulatory Visit | Attending: Hematology & Oncology | Admitting: Hematology & Oncology

## 2019-04-28 DIAGNOSIS — C787 Secondary malignant neoplasm of liver and intrahepatic bile duct: Secondary | ICD-10-CM | POA: Diagnosis present

## 2019-04-28 DIAGNOSIS — C50919 Malignant neoplasm of unspecified site of unspecified female breast: Secondary | ICD-10-CM | POA: Insufficient documentation

## 2019-04-28 LAB — GLUCOSE, CAPILLARY: Glucose-Capillary: 75 mg/dL (ref 70–99)

## 2019-04-28 MED ORDER — FLUDEOXYGLUCOSE F - 18 (FDG) INJECTION
5.8000 | Freq: Once | INTRAVENOUS | Status: AC | PRN
  Administered 2019-04-28: 5.8 via INTRAVENOUS

## 2019-04-28 NOTE — Telephone Encounter (Signed)
Unable to reach pt, lmovm with results. 

## 2019-04-28 NOTE — Telephone Encounter (Signed)
-----   Message from [name], MD sent at 04/28/2019  2:34 PM EDT ----- Call - no obvious active cancer!!!  Taylor Flynn

## 2019-05-05 ENCOUNTER — Ambulatory Visit: Admitting: Family

## 2019-05-05 ENCOUNTER — Ambulatory Visit

## 2019-05-05 ENCOUNTER — Other Ambulatory Visit

## 2019-05-12 ENCOUNTER — Other Ambulatory Visit: Payer: Self-pay

## 2019-05-12 ENCOUNTER — Inpatient Hospital Stay

## 2019-05-12 ENCOUNTER — Telehealth: Payer: Self-pay | Admitting: *Deleted

## 2019-05-12 ENCOUNTER — Inpatient Hospital Stay: Attending: Hematology & Oncology | Admitting: Family

## 2019-05-12 ENCOUNTER — Encounter: Payer: Self-pay | Admitting: Family

## 2019-05-12 VITALS — BP 104/62 | HR 62 | Resp 18

## 2019-05-12 DIAGNOSIS — C773 Secondary and unspecified malignant neoplasm of axilla and upper limb lymph nodes: Secondary | ICD-10-CM

## 2019-05-12 DIAGNOSIS — Z79899 Other long term (current) drug therapy: Secondary | ICD-10-CM | POA: Diagnosis not present

## 2019-05-12 DIAGNOSIS — C787 Secondary malignant neoplasm of liver and intrahepatic bile duct: Secondary | ICD-10-CM | POA: Diagnosis not present

## 2019-05-12 DIAGNOSIS — Z171 Estrogen receptor negative status [ER-]: Secondary | ICD-10-CM

## 2019-05-12 DIAGNOSIS — C50912 Malignant neoplasm of unspecified site of left female breast: Secondary | ICD-10-CM

## 2019-05-12 DIAGNOSIS — Z5112 Encounter for antineoplastic immunotherapy: Secondary | ICD-10-CM | POA: Insufficient documentation

## 2019-05-12 DIAGNOSIS — D500 Iron deficiency anemia secondary to blood loss (chronic): Secondary | ICD-10-CM

## 2019-05-12 DIAGNOSIS — C50919 Malignant neoplasm of unspecified site of unspecified female breast: Secondary | ICD-10-CM

## 2019-05-12 DIAGNOSIS — C792 Secondary malignant neoplasm of skin: Secondary | ICD-10-CM

## 2019-05-12 DIAGNOSIS — C50812 Malignant neoplasm of overlapping sites of left female breast: Secondary | ICD-10-CM

## 2019-05-12 LAB — CBC WITH DIFFERENTIAL (CANCER CENTER ONLY)
Abs Immature Granulocytes: 0.01 10*3/uL (ref 0.00–0.07)
Basophils Absolute: 0 10*3/uL (ref 0.0–0.1)
Basophils Relative: 1 %
Eosinophils Absolute: 0.1 10*3/uL (ref 0.0–0.5)
Eosinophils Relative: 3 %
HCT: 29.7 % — ABNORMAL LOW (ref 36.0–46.0)
Hemoglobin: 9.2 g/dL — ABNORMAL LOW (ref 12.0–15.0)
Immature Granulocytes: 0 %
Lymphocytes Relative: 29 %
Lymphs Abs: 1.2 10*3/uL (ref 0.7–4.0)
MCH: 28 pg (ref 26.0–34.0)
MCHC: 31 g/dL (ref 30.0–36.0)
MCV: 90.3 fL (ref 80.0–100.0)
Monocytes Absolute: 0.4 10*3/uL (ref 0.1–1.0)
Monocytes Relative: 10 %
Neutro Abs: 2.4 10*3/uL (ref 1.7–7.7)
Neutrophils Relative %: 57 %
Platelet Count: 233 10*3/uL (ref 150–400)
RBC: 3.29 MIL/uL — ABNORMAL LOW (ref 3.87–5.11)
RDW: 15 % (ref 11.5–15.5)
WBC Count: 4.1 10*3/uL (ref 4.0–10.5)
nRBC: 0 % (ref 0.0–0.2)

## 2019-05-12 LAB — CMP (CANCER CENTER ONLY)
ALT: 11 U/L (ref 0–44)
AST: 20 U/L (ref 15–41)
Albumin: 4.1 g/dL (ref 3.5–5.0)
Alkaline Phosphatase: 47 U/L (ref 38–126)
Anion gap: 5 (ref 5–15)
BUN: 19 mg/dL (ref 6–20)
CO2: 28 mmol/L (ref 22–32)
Calcium: 8.8 mg/dL — ABNORMAL LOW (ref 8.9–10.3)
Chloride: 107 mmol/L (ref 98–111)
Creatinine: 0.89 mg/dL (ref 0.44–1.00)
GFR, Est AFR Am: >60 mL/min (ref >60)
GFR, Estimated: >60 mL/min (ref >60)
Glucose, Bld: 84 mg/dL (ref 70–99)
Potassium: 4.1 mmol/L (ref 3.5–5.1)
Sodium: 140 mmol/L (ref 135–145)
Total Bilirubin: 0.3 mg/dL (ref 0.3–1.2)
Total Protein: 6.4 g/dL — ABNORMAL LOW (ref 6.5–8.1)

## 2019-05-12 LAB — IRON AND TIBC
Iron: 48 ug/dL (ref 41–142)
Saturation Ratios: 11 % — ABNORMAL LOW (ref 21–57)
TIBC: 421 ug/dL (ref 236–444)
UIBC: 373 ug/dL (ref 120–384)

## 2019-05-12 LAB — FERRITIN: Ferritin: 7 ng/mL — ABNORMAL LOW (ref 11–307)

## 2019-05-12 MED ORDER — SODIUM CHLORIDE 0.9% FLUSH
10.0000 mL | INTRAVENOUS | Status: DC | PRN
  Administered 2019-05-12: 10 mL
  Filled 2019-05-12: qty 10

## 2019-05-12 MED ORDER — ACETAMINOPHEN 325 MG PO TABS: 650.0000 mg | ORAL_TABLET | Freq: Once | ORAL | Status: DC

## 2019-05-12 MED ORDER — SODIUM CHLORIDE 0.9 % IV SOLN
420.0000 mg | Freq: Once | INTRAVENOUS | Status: AC
  Administered 2019-05-12: 420 mg via INTRAVENOUS
  Filled 2019-05-12: qty 14

## 2019-05-12 MED ORDER — DIPHENHYDRAMINE HCL 25 MG PO CAPS: 50.0000 mg | ORAL_CAPSULE | Freq: Once | ORAL | Status: DC

## 2019-05-12 MED ORDER — HEPARIN SOD (PORK) LOCK FLUSH 100 UNIT/ML IV SOLN
500.0000 [IU] | Freq: Once | INTRAVENOUS | Status: AC | PRN
  Administered 2019-05-12: 500 [IU]
  Filled 2019-05-12: qty 5

## 2019-05-12 MED ORDER — SODIUM CHLORIDE 0.9 % IV SOLN
Freq: Once | INTRAVENOUS | Status: AC
  Administered 2019-05-12: 11:00:00 via INTRAVENOUS
  Filled 2019-05-12: qty 250

## 2019-05-12 MED ORDER — TRASTUZUMAB CHEMO 150 MG IV SOLR
300.0000 mg | Freq: Once | INTRAVENOUS | Status: AC
  Administered 2019-05-12: 300 mg via INTRAVENOUS
  Filled 2019-05-12: qty 14.29

## 2019-05-12 NOTE — Patient Instructions (Signed)

## 2019-05-12 NOTE — Telephone Encounter (Signed)
Pt called at 0849 states she had child care issues, is [place] and will be late today. Pt advised she has attempted to call office, unable to reach anyone.

## 2019-05-12 NOTE — Patient Instructions (Signed)
Pertuzumab injection What is this medicine? PERTUZUMAB (per TOOZ ue mab) is a monoclonal antibody. It is used to treat breast cancer. This medicine may be used for other purposes; ask your health care provider or pharmacist if you have questions. COMMON BRAND NAME(S): PERJETA What should I tell my health care provider before I take this medicine? They need to know if you have any of these conditions: -heart disease -heart failure -high blood pressure -history of irregular heart beat -recent or ongoing radiation therapy -an unusual or allergic reaction to pertuzumab, other medicines, foods, dyes, or preservatives -pregnant or trying to get pregnant -breast-feeding How should I use this medicine? This medicine is for infusion into a vein. It is given by a health care professional in a hospital or clinic setting. Talk to your pediatrician regarding the use of this medicine in children. Special care may be needed. Overdosage: If you think you have taken too much of this medicine contact a poison control center or emergency room at once. NOTE: This medicine is only for you. Do not share this medicine with others. What if I miss a dose? It is important not to miss your dose. Call your doctor or health care professional if you are unable to keep an appointment. What may interact with this medicine? Interactions are not expected. Give your health care provider a list of all the medicines, herbs, non-prescription drugs, or dietary supplements you use. Also tell them if you smoke, drink alcohol, or use illegal drugs. Some items may interact with your medicine. This list may not describe all possible interactions. Give your health care provider a list of all the medicines, herbs, non-prescription drugs, or dietary supplements you use. Also tell them if you smoke, drink alcohol, or use illegal drugs. Some items may interact with your medicine. What should I watch for while using this medicine? Your  condition will be monitored carefully while you are receiving this medicine. Report any side effects. Continue your course of treatment even though you feel ill unless your doctor tells you to stop. Do not become pregnant while taking this medicine or for 7 months after stopping it. Women should inform their doctor if they wish to become pregnant or think they might be pregnant. Women of child-bearing potential will need to have a negative pregnancy test before starting this medicine. There is a potential for serious side effects to an unborn child. Talk to your health care professional or pharmacist for more information. Do not breast-feed an infant while taking this medicine or for 7 months after stopping it. Women must use effective birth control with this medicine. Call your doctor or health care professional for advice if you get a fever, chills or sore throat, or other symptoms of a cold or flu. Do not treat yourself. Try to avoid being around people who are sick. You may experience fever, chills, and headache during the infusion. Report any side effects during the infusion to your health care professional. What side effects may I notice from receiving this medicine? Side effects that you should report to your doctor or health care professional as soon as possible: -breathing problems -chest pain or palpitations -dizziness -feeling faint or lightheaded -fever or chills -skin rash, itching or hives -sore throat -swelling of the face, lips, or tongue -swelling of the legs or ankles -unusually weak or tired Side effects that usually do not require medical attention (report to your doctor or health care professional if they continue or are bothersome): -diarrhea -hair   loss -nausea, vomiting -tiredness This list may not describe all possible side effects. Call your doctor for medical advice about side effects. You may report side effects to FDA at 1-800-FDA-1088. Where should I keep my  medicine? This drug is given in a hospital or clinic and will not be stored at home. NOTE: This sheet is a summary. It may not cover all possible information. If you have questions about this medicine, talk to your doctor, pharmacist, or health care provider.  2019 Elsevier/Gold Standard (2015-11-23 12:08:50) Trastuzumab injection for infusion What is this medicine? TRASTUZUMAB (tras TOO zoo mab) is a monoclonal antibody. It is used to treat breast cancer and stomach cancer. This medicine may be used for other purposes; ask your health care provider or pharmacist if you have questions. COMMON BRAND NAME(S): Herceptin, KANJINTI, OGIVRI What should I tell my health care provider before I take this medicine? They need to know if you have any of these conditions: -heart disease -heart failure -lung or breathing disease, like asthma -an unusual or allergic reaction to trastuzumab, benzyl alcohol, or other medications, foods, dyes, or preservatives -pregnant or trying to get pregnant -breast-feeding How should I use this medicine? This drug is given as an infusion into a vein. It is administered in a hospital or clinic by a specially trained health care professional. Talk to your pediatrician regarding the use of this medicine in children. This medicine is not approved for use in children. Overdosage: If you think you have taken too much of this medicine contact a poison control center or emergency room at once. NOTE: This medicine is only for you. Do not share this medicine with others. What if I miss a dose? It is important not to miss a dose. Call your doctor or health care professional if you are unable to keep an appointment. What may interact with this medicine? This medicine may interact with the following medications: -certain types of chemotherapy, such as daunorubicin, doxorubicin, epirubicin, and idarubicin This list may not describe all possible interactions. Give your health care  provider a list of all the medicines, herbs, non-prescription drugs, or dietary supplements you use. Also tell them if you smoke, drink alcohol, or use illegal drugs. Some items may interact with your medicine. What should I watch for while using this medicine? Visit your doctor for checks on your progress. Report any side effects. Continue your course of treatment even though you feel ill unless your doctor tells you to stop. Call your doctor or health care professional for advice if you get a fever, chills or sore throat, or other symptoms of a cold or flu. Do not treat yourself. Try to avoid being around people who are sick. You may experience fever, chills and shaking during your first infusion. These effects are usually mild and can be treated with other medicines. Report any side effects during the infusion to your health care professional. Fever and chills usually do not happen with later infusions. Do not become pregnant while taking this medicine or for 7 months after stopping it. Women should inform their doctor if they wish to become pregnant or think they might be pregnant. Women of child-bearing potential will need to have a negative pregnancy test before starting this medicine. There is a potential for serious side effects to an unborn child. Talk to your health care professional or pharmacist for more information. Do not breast-feed an infant while taking this medicine or for 7 months after stopping it. Women must use effective   birth control with this medicine. What side effects may I notice from receiving this medicine? Side effects that you should report to your doctor or health care professional as soon as possible: -allergic reactions like skin rash, itching or hives, swelling of the face, lips, or tongue -chest pain or palpitations -cough -dizziness -feeling faint or lightheaded, falls -fever -general ill feeling or flu-like symptoms -signs of worsening heart failure like  breathing problems; swelling in your legs and feet -unusually weak or tired Side effects that usually do not require medical attention (report to your doctor or health care professional if they continue or are bothersome): -bone pain -changes in taste -diarrhea -joint pain -nausea/vomiting -weight loss This list may not describe all possible side effects. Call your doctor for medical advice about side effects. You may report side effects to FDA at 1-800-FDA-1088. Where should I keep my medicine? This drug is given in a hospital or clinic and will not be stored at home. NOTE: This sheet is a summary. It may not cover all possible information. If you have questions about this medicine, talk to your doctor, pharmacist, or health care provider.  2019 Elsevier/Gold Standard (2016-10-15 14:37:52)  

## 2019-05-12 NOTE — Progress Notes (Signed)
Hematology and Oncology Follow Up Visit  Taylor Flynn [id number] 22-Aug-1976 43 y.o. 05/12/2019   Principle Diagnosis:  Metastatic breast cancer-ER-/HER-2+  Current Therapy:   Herceptin/Perjeta- s/p cycle9(previous cycles given in [place]) Zometa 4 mg IV q 2 monthsdue again in May   Interim History:  Taylor Flynn is here today for follow-up and treatment. She is doing well and her PET scan 2 weeks ago was stable.  CA 27.29 was 13.3 in June.  Her natural path has done lots of tests recently and now has her on a high protein/Keto diet. She states that this has made a big difference and she is no longer feeling bloated. She also periodically fasts.  She admits that she needs to better hydrate daily. Her weight is stable.  No fever, chills, n/v, cough, rash, dizziness, SOB, chest pain, palpitations, abdominal pain or changes in bowel or bladder habits.  No lymphadenopathy noted. Her bilateral breast exam is unchanged.  No episodes of bleeding, no bruising or petechiae.  No swelling, tenderness, numbness or tingling in her extremities.   ECOG Performance Status: 1 - Symptomatic but completely ambulatory  Medications:  Allergies as of 05/12/2019      Reactions   Aspirin Swelling   Angioedema    Sulfa Antibiotics Rash      Medication List       Accurate as of May 12, 2019 10:02 AM. If you have any questions, ask your nurse or doctor.        bicalutamide 50 MG tablet Commonly known as: CASODEX Take 150 mg by mouth daily.   COQ10 PO Take 1 Dose by mouth 2 (two) times daily.   cyclophosphamide 50 MG capsule Commonly known as: CYTOXAN Take 50 mg by mouth every evening.   EPINEPHrine 0.3 mg/0.3 mL Soaj injection Commonly known as: EPI-PEN Inject 0.3 mg into the muscle as needed for anaphylaxis.   EUROPEAN MISTLETOE [place] Inject 100 mg into the skin every Monday, Wednesday, and Friday.   HERCEPTIN IV Inject 1 Dose into the vein every 21 ( twenty-one) days. Combined with Perjeta    ivermectin 3 MG Tabs tablet Commonly known as: STROMECTOL Take 6 mg by mouth daily.   NALTREXONE HCL PO Take 4.5 mg by mouth See admin instructions. Take 4.5 mg by mouth at night Wen through [place] Take 1-2 capsules by mouth See admin instructions. lumbrokinase - Take 1 capsule in the morning 2 capsules at lunch and 1 capsule at bedtime   PERJETA IV Inject 1 Dose into the vein every 21 ( twenty-one) days. Combined with Herceptin   Probiotic Caps Take 2 capsules by mouth every other day. Daily with a meal   VITAMIN D-VITAMIN K PO Place 5 drops under the tongue daily.   ZOMETA IV Inject 1 Dose into the vein every 6 (six) weeks.       Allergies:  Allergies  Allergen Reactions  . Aspirin Swelling    Angioedema   . Sulfa Antibiotics Rash    Past Medical History, Surgical history, Social history, and Family History were reviewed and updated.  Review of Systems: All other 10 point review of systems is negative.   Physical Exam:  vitals were not taken for this visit.   Wt Readings from Last 3 Encounters:  04/21/19 116 lb (52.6 kg)  03/23/19 115 lb 0.6 oz (52.2 kg)  03/23/19 115 lb (52.2 kg)    Ocular: Sclerae unicteric, pupils equal, round and reactive to light Ear-nose-throat: Oropharynx  clear, dentition fair Lymphatic: No cervical or supraclavicular adenopathy Lungs no rales or rhonchi, good excursion bilaterally Heart regular rate and rhythm, no murmur appreciated Abd soft, nontender, positive bowel sounds, no liver or spleen tip palpated on exam, no fluid wave  MSK no focal spinal tenderness, no joint edema Neuro: non-focal, well-oriented, appropriate affect Breasts: No changes on today's exam  Lab Results  Component Value Date   WBC 4.2 04/21/2019   HGB 9.4 (L) 04/21/2019   HCT 30.0 (L) 04/21/2019   MCV 90.4 04/21/2019   PLT 232 04/21/2019   Lab Results  Component Value Date   FERRITIN 5 (L) 04/21/2019   IRON 33 (L)  04/21/2019   TIBC 406 04/21/2019   UIBC 374 04/21/2019   IRONPCTSAT 8 (L) 04/21/2019   Lab Results  Component Value Date   RETICCTPCT 0.8 11/17/2018   RBC 3.32 (L) 04/21/2019   No results found for: KPAFRELGTCHN, LAMBDASER, KAPLAMBRATIO No results found for: IGGSERUM, IGA, IGMSERUM No results found for: Odetta Pink, SPEI   Chemistry      Component Value Date/Time   NA 140 04/21/2019 0954   K 4.1 04/21/2019 0954   CL 107 04/21/2019 0954   CO2 28 04/21/2019 0954   BUN 11 04/21/2019 0954   CREATININE 0.97 04/21/2019 0954      Component Value Date/Time   CALCIUM 9.4 04/21/2019 0954   ALKPHOS 53 04/21/2019 0954   AST 21 04/21/2019 0954   ALT 14 04/21/2019 0954   BILITOT 0.3 04/21/2019 0954       Impression and Plan: Taylor Flynn is a very pleasant 43 yo African American female with metastatic breast cancer. We will proceed with treatment today as planned.  We will see her back in another 3 weeks.  She will contact our office with any questions or concerns. We can certainly see her sooner if needed.   [name], NP 7/8/202010:02 AM

## 2019-06-02 ENCOUNTER — Inpatient Hospital Stay

## 2019-06-02 ENCOUNTER — Other Ambulatory Visit: Payer: Self-pay

## 2019-06-02 ENCOUNTER — Inpatient Hospital Stay: Admitting: Hematology & Oncology

## 2019-06-02 NOTE — Progress Notes (Signed)
The following biosimilar Ogivri (trastuzumab-dkst) has been selected for use in this patient.  This will begin w/ next tx day.  Kennith Center, Pharm.D., CPP 06/02/2019@3 :43 PM

## 2019-06-09 ENCOUNTER — Inpatient Hospital Stay: Attending: Hematology & Oncology

## 2019-06-09 ENCOUNTER — Encounter: Payer: Self-pay | Admitting: Family

## 2019-06-09 ENCOUNTER — Other Ambulatory Visit: Payer: Self-pay

## 2019-06-09 ENCOUNTER — Inpatient Hospital Stay

## 2019-06-09 ENCOUNTER — Inpatient Hospital Stay (HOSPITAL_BASED_OUTPATIENT_CLINIC_OR_DEPARTMENT_OTHER): Admitting: Family

## 2019-06-09 VITALS — BP 109/60 | HR 54 | Temp 98.0°F | Resp 18 | Ht 63.5 in | Wt 116.0 lb

## 2019-06-09 DIAGNOSIS — Z171 Estrogen receptor negative status [ER-]: Secondary | ICD-10-CM

## 2019-06-09 DIAGNOSIS — Z5112 Encounter for antineoplastic immunotherapy: Secondary | ICD-10-CM | POA: Diagnosis not present

## 2019-06-09 DIAGNOSIS — C50919 Malignant neoplasm of unspecified site of unspecified female breast: Secondary | ICD-10-CM

## 2019-06-09 DIAGNOSIS — C792 Secondary malignant neoplasm of skin: Secondary | ICD-10-CM

## 2019-06-09 DIAGNOSIS — C50812 Malignant neoplasm of overlapping sites of left female breast: Secondary | ICD-10-CM

## 2019-06-09 DIAGNOSIS — C50912 Malignant neoplasm of unspecified site of left female breast: Secondary | ICD-10-CM | POA: Diagnosis not present

## 2019-06-09 DIAGNOSIS — D500 Iron deficiency anemia secondary to blood loss (chronic): Secondary | ICD-10-CM

## 2019-06-09 DIAGNOSIS — C787 Secondary malignant neoplasm of liver and intrahepatic bile duct: Secondary | ICD-10-CM

## 2019-06-09 LAB — CBC WITH DIFFERENTIAL (CANCER CENTER ONLY)
Abs Immature Granulocytes: 0.01 10*3/uL (ref 0.00–0.07)
Basophils Absolute: 0 10*3/uL (ref 0.0–0.1)
Basophils Relative: 1 %
Eosinophils Absolute: 0.1 10*3/uL (ref 0.0–0.5)
Eosinophils Relative: 3 %
HCT: 28.2 % — ABNORMAL LOW (ref 36.0–46.0)
Hemoglobin: 8.7 g/dL — ABNORMAL LOW (ref 12.0–15.0)
Immature Granulocytes: 0 %
Lymphocytes Relative: 29 %
Lymphs Abs: 1.1 10*3/uL (ref 0.7–4.0)
MCH: 27.7 pg (ref 26.0–34.0)
MCHC: 30.9 g/dL (ref 30.0–36.0)
MCV: 89.8 fL (ref 80.0–100.0)
Monocytes Absolute: 0.4 10*3/uL (ref 0.1–1.0)
Monocytes Relative: 10 %
Neutro Abs: 2.2 10*3/uL (ref 1.7–7.7)
Neutrophils Relative %: 57 %
Platelet Count: 260 10*3/uL (ref 150–400)
RBC: 3.14 MIL/uL — ABNORMAL LOW (ref 3.87–5.11)
RDW: 13.7 % (ref 11.5–15.5)
WBC Count: 3.9 10*3/uL — ABNORMAL LOW (ref 4.0–10.5)
nRBC: 0 % (ref 0.0–0.2)

## 2019-06-09 LAB — CMP (CANCER CENTER ONLY)
ALT: 10 U/L (ref 0–44)
AST: 19 U/L (ref 15–41)
Albumin: 3.8 g/dL (ref 3.5–5.0)
Alkaline Phosphatase: 50 U/L (ref 38–126)
Anion gap: 5 (ref 5–15)
BUN: 12 mg/dL (ref 6–20)
CO2: 26 mmol/L (ref 22–32)
Calcium: 8.3 mg/dL — ABNORMAL LOW (ref 8.9–10.3)
Chloride: 108 mmol/L (ref 98–111)
Creatinine: 0.88 mg/dL (ref 0.44–1.00)
GFR, Est AFR Am: >60 mL/min (ref >60)
GFR, Estimated: >60 mL/min (ref >60)
Glucose, Bld: 76 mg/dL (ref 70–99)
Potassium: 4.1 mmol/L (ref 3.5–5.1)
Sodium: 139 mmol/L (ref 135–145)
Total Bilirubin: 0.3 mg/dL (ref 0.3–1.2)
Total Protein: 6.1 g/dL — ABNORMAL LOW (ref 6.5–8.1)

## 2019-06-09 MED ORDER — TRASTUZUMAB CHEMO 150 MG IV SOLR
300.0000 mg | Freq: Once | INTRAVENOUS | Status: AC
  Administered 2019-06-09: 300 mg via INTRAVENOUS
  Filled 2019-06-09: qty 14.29

## 2019-06-09 MED ORDER — DIPHENHYDRAMINE HCL 25 MG PO CAPS: 50.0000 mg | ORAL_CAPSULE | Freq: Once | ORAL | Status: DC

## 2019-06-09 MED ORDER — ACETAMINOPHEN 325 MG PO TABS: 650.0000 mg | ORAL_TABLET | Freq: Once | ORAL | Status: DC

## 2019-06-09 MED ORDER — SODIUM CHLORIDE 0.9 % IV SOLN
420.0000 mg | Freq: Once | INTRAVENOUS | Status: AC
  Administered 2019-06-09: 420 mg via INTRAVENOUS
  Filled 2019-06-09: qty 14

## 2019-06-09 MED ORDER — ZOLEDRONIC ACID 4 MG/100ML IV SOLN
4.0000 mg | Freq: Once | INTRAVENOUS | Status: AC
  Administered 2019-06-09: 4 mg via INTRAVENOUS
  Filled 2019-06-09: qty 100

## 2019-06-09 MED ORDER — SODIUM CHLORIDE 0.9% FLUSH
10.0000 mL | INTRAVENOUS | Status: DC | PRN
  Administered 2019-06-09: 10 mL
  Filled 2019-06-09: qty 10

## 2019-06-09 MED ORDER — HEPARIN SOD (PORK) LOCK FLUSH 100 UNIT/ML IV SOLN
500.0000 [IU] | Freq: Once | INTRAVENOUS | Status: AC | PRN
  Administered 2019-06-09: 500 [IU]
  Filled 2019-06-09: qty 5

## 2019-06-09 MED ORDER — LIDOCAINE-PRILOCAINE 2.5-2.5 % EX CREA: 1.0000 "application " | TOPICAL_CREAM | CUTANEOUS | 2 refills | Status: DC | PRN

## 2019-06-09 MED ORDER — SODIUM CHLORIDE 0.9 % IV SOLN
Freq: Once | INTRAVENOUS | Status: AC
  Administered 2019-06-09: 11:00:00 via INTRAVENOUS
  Filled 2019-06-09: qty 250

## 2019-06-09 NOTE — Progress Notes (Signed)
Hematology and Oncology Follow Up Visit  Taylor Flynn [id number] 1976/06/24 43 y.o. 06/09/2019   Principle Diagnosis:  Metastatic breast cancer-ER-/HER-2+  Current Therapy:   Herceptin/Perjeta- s/p cycle9(previous cycles given in [place]) Zometa 4 mg IV q 2 monthsdue again in July   Interim History:  Ms. Taylor Flynn is here today for follow-up and treatment. She is doing well but states that has noted some sciatica in the right leg and decreased sensation in the right foot at times. She has mild intermittent lower back pain that she states is all tolerable.  She states that she has been on her feet a lot more and doing much more walking. She is hoping that doing more yoga and stretching will help alleviate any discomfort.  We discussed doing an MRI of the lower back if her pain becomes more frequent/persistent. She will keep [place] informed on how she is feeling. She has not had any loss of mobility or incontinence.   She not had any falls or syncopal episodes.  She is making a conscious effort to stay well hydrated as she has noted palpitations when she has not had enough fluids.  Breast exam today was negative, no changes. No lymphadenopathy noted on exam.  No fever, chills, n/v, cough, rash, dizziness, SOB, chest pain, abdominal pain or changes in bowel or bladder habits.   She has occasional mild puffiness in her feet and ankles. This comes and goes.  She is iron deficient (saturation 11% and ferritin 7) but prefers to avoid oral or IV supplementation. She plans to increase her dietary intake of iron (she does not eat red meat or grains and also avoids fruits high in sugar) and will also be speaking oncologic naturopath doctor about available options.  Her weight is stable at 116 lbs.    ECOG Performance Status: 1 - Symptomatic but completely ambulatory  Medications:  Allergies as of 06/09/2019      Reactions   Aspirin Swelling   Angioedema    Sulfa Antibiotics Rash      Medication List        Accurate as of June 09, 2019 10:07 AM. If you have any questions, ask your nurse or doctor.        bicalutamide 50 MG tablet Commonly known as: CASODEX Take 150 mg by mouth daily.   COQ10 PO Take 1 Dose by mouth 2 (two) times daily.   cyclophosphamide 50 MG capsule Commonly known as: CYTOXAN Take 50 mg by mouth every evening.   EPINEPHrine 0.3 mg/0.3 mL Soaj injection Commonly known as: EPI-PEN Inject 0.3 mg into the muscle as needed for anaphylaxis.   EUROPEAN MISTLETOE [place] Inject 100 mg into the skin every Monday, Wednesday, and Friday.   HERCEPTIN IV Inject 1 Dose into the vein every 21 ( twenty-one) days. Combined with Perjeta   ivermectin 3 MG Tabs tablet Commonly known as: STROMECTOL Take 6 mg by mouth daily.   NALTREXONE HCL PO Take 4.5 mg by mouth See admin instructions. Take 4.5 mg by mouth at night Wen through [place] Take 1-2 capsules by mouth See admin instructions. lumbrokinase - Take 1 capsule in the morning 2 capsules at lunch and 1 capsule at bedtime   PERJETA IV Inject 1 Dose into the vein every 21 ( twenty-one) days. Combined with Herceptin   Probiotic Caps Take 2 capsules by mouth every other day. Daily with a meal   VITAMIN D-VITAMIN K PO Place 5 drops under the tongue  daily.   ZOMETA IV Inject 1 Dose into the vein every 6 (six) weeks.       Allergies:  Allergies  Allergen Reactions  . Aspirin Swelling    Angioedema   . Sulfa Antibiotics Rash    Past Medical History, Surgical history, Social history, and Family History were reviewed and updated.  Review of Systems: All other 10 point review of systems is negative.   Physical Exam:  vitals were not taken for this visit.   Wt Readings from Last 3 Encounters:  04/21/19 116 lb (52.6 kg)  03/23/19 115 lb 0.6 oz (52.2 kg)  03/23/19 115 lb (52.2 kg)    Ocular: Sclerae unicteric, pupils equal, round and reactive to light Ear-nose-throat: Oropharynx  clear, dentition fair Lymphatic: No cervical or supraclavicular adenopathy Lungs no rales or rhonchi, good excursion bilaterally Heart regular rate and rhythm, no murmur appreciated Abd soft, nontender, positive bowel sounds, no liver or spleen tip palpated on exam, no fluid wave  MSK no focal spinal tenderness, no joint edema Neuro: non-focal, well-oriented, appropriate affect Breasts: No mass, lesion or rash noted  Lab Results  Component Value Date   WBC 3.9 (L) 06/09/2019   HGB 8.7 (L) 06/09/2019   HCT 28.2 (L) 06/09/2019   MCV 89.8 06/09/2019   PLT 260 06/09/2019   Lab Results  Component Value Date   FERRITIN 7 (L) 05/12/2019   IRON 48 05/12/2019   TIBC 421 05/12/2019   UIBC 373 05/12/2019   IRONPCTSAT 11 (L) 05/12/2019   Lab Results  Component Value Date   RETICCTPCT 0.8 11/17/2018   RBC 3.14 (L) 06/09/2019   No results found for: KPAFRELGTCHN, LAMBDASER, KAPLAMBRATIO No results found for: IGGSERUM, IGA, IGMSERUM No results found for: Odetta Pink, SPEI   Chemistry      Component Value Date/Time   NA 140 05/12/2019 0954   K 4.1 05/12/2019 0954   CL 107 05/12/2019 0954   CO2 28 05/12/2019 0954   BUN 19 05/12/2019 0954   CREATININE 0.89 05/12/2019 0954      Component Value Date/Time   CALCIUM 8.8 (L) 05/12/2019 0954   ALKPHOS 47 05/12/2019 0954   AST 20 05/12/2019 0954   ALT 11 05/12/2019 0954   BILITOT 0.3 05/12/2019 0954       Impression and Plan: Ms. Taylor Flynn is a very pleasant 43 yo African American female with metastatic breast cancer. CA 27.29 result is pending.  We will proceed with treatment today as planned.  She requested Emla cream for her port so I have sent a prescription to [facility] in [place].  She will increase her dietary iron intake.  We will plan to see her back in another 3 weeks.  She will contact our office with any questions or concerns. We can certainly see her sooner if  needed.  [name], NP 8/5/202010:07 AM

## 2019-06-09 NOTE — Patient Instructions (Addendum)
Taylor Flynn Discharge Instructions for Patients Receiving Chemotherapy  Today you received the following chemotherapy agents Zometa, Perjeta, Herceptin.  To help prevent nausea and vomiting after your treatment, we encourage you to take your nausea medication {CHL ONC AP TAKE HOME ME as indicated by your MD. If you develop nausea and vomiting that is not controlled by your nausea medication, call the clinic.   BELOW ARE SYMPTOMS THAT SHOULD BE REPORTED IMMEDIATELY:  *FEVER GREATER THAN 100.5 F  *CHILLS WITH OR WITHOUT FEVER  NAUSEA AND VOMITING THAT IS NOT CONTROLLED WITH YOUR NAUSEA MEDICATION  *UNUSUAL SHORTNESS OF BREATH  *UNUSUAL BRUISING OR BLEEDING  TENDERNESS IN MOUTH AND THROAT WITH OR WITHOUT PRESENCE OF ULCERS  *URINARY PROBLEMS  *BOWEL PROBLEMS  UNUSUAL RASH Items with * indicate a potential emergency and should be followed up as soon as possible.  Feel free to call the clinic should you have any questions or concerns. The clinic phone number is [phone].  Please show the [place] at check-in to the Emergency Department and triage nurse.

## 2019-06-09 NOTE — Progress Notes (Signed)
Ok to give Zometa today with the calcium levels per Dr. Marin Olp.

## 2019-06-09 NOTE — Progress Notes (Signed)
06/09/19 @ 1140  Taylor Flynn will NOT be converting to trastuzumab biosimilar agent as she does not wish to have any other drug than Herceptin.  Treatment plan pharmacy communication noted to not change future doses.  Dr Marin Olp confirmed with Taylor Flynn she is not interested in a medication change.  Thank you for the opportunity to participate in Taylor Flynn care.  Henreitta Leber, PharmD

## 2019-06-10 LAB — IRON AND TIBC
Iron: 21 ug/dL — ABNORMAL LOW (ref 41–142)
Saturation Ratios: 5 % — ABNORMAL LOW (ref 21–57)
TIBC: 426 ug/dL (ref 236–444)
UIBC: 405 ug/dL — ABNORMAL HIGH (ref 120–384)

## 2019-06-10 LAB — CANCER ANTIGEN 27.29: CA 27.29: 10.9 U/mL (ref 0.0–38.6)

## 2019-06-10 LAB — FERRITIN: Ferritin: 4 ng/mL — ABNORMAL LOW (ref 11–307)

## 2019-06-24 ENCOUNTER — Other Ambulatory Visit

## 2019-06-24 ENCOUNTER — Ambulatory Visit: Admitting: Hematology & Oncology

## 2019-06-24 ENCOUNTER — Ambulatory Visit

## 2019-06-30 ENCOUNTER — Inpatient Hospital Stay: Admitting: Family

## 2019-06-30 ENCOUNTER — Inpatient Hospital Stay

## 2019-07-14 ENCOUNTER — Other Ambulatory Visit

## 2019-07-15 ENCOUNTER — Ambulatory Visit: Admitting: Hematology & Oncology

## 2019-07-15 ENCOUNTER — Ambulatory Visit

## 2019-07-15 ENCOUNTER — Other Ambulatory Visit

## 2019-07-21 ENCOUNTER — Inpatient Hospital Stay

## 2019-07-21 ENCOUNTER — Other Ambulatory Visit: Payer: Self-pay

## 2019-07-21 ENCOUNTER — Encounter: Payer: Self-pay | Admitting: Hematology & Oncology

## 2019-07-21 ENCOUNTER — Inpatient Hospital Stay: Attending: Hematology & Oncology | Admitting: Hematology & Oncology

## 2019-07-21 ENCOUNTER — Other Ambulatory Visit: Payer: Self-pay | Admitting: Family

## 2019-07-21 VITALS — BP 113/69 | HR 62 | Temp 97.6°F | Resp 18 | Wt 113.0 lb

## 2019-07-21 DIAGNOSIS — Z171 Estrogen receptor negative status [ER-]: Secondary | ICD-10-CM | POA: Insufficient documentation

## 2019-07-21 DIAGNOSIS — C787 Secondary malignant neoplasm of liver and intrahepatic bile duct: Secondary | ICD-10-CM

## 2019-07-21 DIAGNOSIS — C50919 Malignant neoplasm of unspecified site of unspecified female breast: Secondary | ICD-10-CM

## 2019-07-21 DIAGNOSIS — C50912 Malignant neoplasm of unspecified site of left female breast: Secondary | ICD-10-CM

## 2019-07-21 DIAGNOSIS — D500 Iron deficiency anemia secondary to blood loss (chronic): Secondary | ICD-10-CM

## 2019-07-21 DIAGNOSIS — Z5112 Encounter for antineoplastic immunotherapy: Secondary | ICD-10-CM | POA: Insufficient documentation

## 2019-07-21 LAB — CBC WITH DIFFERENTIAL (CANCER CENTER ONLY)
Abs Immature Granulocytes: 0 10*3/uL (ref 0.00–0.07)
Basophils Absolute: 0 10*3/uL (ref 0.0–0.1)
Basophils Relative: 1 %
Eosinophils Absolute: 0.1 10*3/uL (ref 0.0–0.5)
Eosinophils Relative: 2 %
HCT: 30.9 % — ABNORMAL LOW (ref 36.0–46.0)
Hemoglobin: 9.7 g/dL — ABNORMAL LOW (ref 12.0–15.0)
Immature Granulocytes: 0 %
Lymphocytes Relative: 31 %
Lymphs Abs: 1.2 10*3/uL (ref 0.7–4.0)
MCH: 27.4 pg (ref 26.0–34.0)
MCHC: 31.4 g/dL (ref 30.0–36.0)
MCV: 87.3 fL (ref 80.0–100.0)
Monocytes Absolute: 0.3 10*3/uL (ref 0.1–1.0)
Monocytes Relative: 9 %
Neutro Abs: 2.2 10*3/uL (ref 1.7–7.7)
Neutrophils Relative %: 57 %
Platelet Count: 240 10*3/uL (ref 150–400)
RBC: 3.54 MIL/uL — ABNORMAL LOW (ref 3.87–5.11)
RDW: 14.2 % (ref 11.5–15.5)
WBC Count: 3.8 10*3/uL — ABNORMAL LOW (ref 4.0–10.5)
nRBC: 0 % (ref 0.0–0.2)

## 2019-07-21 LAB — CMP (CANCER CENTER ONLY)
ALT: 14 U/L (ref 0–44)
AST: 20 U/L (ref 15–41)
Albumin: 4.2 g/dL (ref 3.5–5.0)
Alkaline Phosphatase: 47 U/L (ref 38–126)
Anion gap: 9 (ref 5–15)
BUN: 11 mg/dL (ref 6–20)
CO2: 26 mmol/L (ref 22–32)
Calcium: 9.4 mg/dL (ref 8.9–10.3)
Chloride: 103 mmol/L (ref 98–111)
Creatinine: 0.97 mg/dL (ref 0.44–1.00)
GFR, Est AFR Am: >60 mL/min (ref >60)
GFR, Estimated: >60 mL/min (ref >60)
Glucose, Bld: 69 mg/dL — ABNORMAL LOW (ref 70–99)
Potassium: 3.8 mmol/L (ref 3.5–5.1)
Sodium: 138 mmol/L (ref 135–145)
Total Bilirubin: 0.5 mg/dL (ref 0.3–1.2)
Total Protein: 6.5 g/dL (ref 6.5–8.1)

## 2019-07-21 LAB — IRON AND TIBC
Iron: 71 ug/dL (ref 41–142)
Saturation Ratios: 18 % — ABNORMAL LOW (ref 21–57)
TIBC: 393 ug/dL (ref 236–444)
UIBC: 321 ug/dL (ref 120–384)

## 2019-07-21 LAB — FERRITIN: Ferritin: 8 ng/mL — ABNORMAL LOW (ref 11–307)

## 2019-07-21 MED ORDER — HEPARIN SOD (PORK) LOCK FLUSH 100 UNIT/ML IV SOLN
500.0000 [IU] | Freq: Once | INTRAVENOUS | Status: AC | PRN
  Administered 2019-07-21: 500 [IU]
  Filled 2019-07-21: qty 5

## 2019-07-21 MED ORDER — SODIUM CHLORIDE 0.9 % IV SOLN
Freq: Once | INTRAVENOUS | Status: AC
  Administered 2019-07-21: 13:00:00 via INTRAVENOUS
  Filled 2019-07-21: qty 250

## 2019-07-21 MED ORDER — DIPHENHYDRAMINE HCL 25 MG PO CAPS
ORAL_CAPSULE | ORAL | Status: AC
  Filled 2019-07-21: qty 2

## 2019-07-21 MED ORDER — ACETAMINOPHEN 325 MG PO TABS
ORAL_TABLET | ORAL | Status: AC
  Filled 2019-07-21: qty 2

## 2019-07-21 MED ORDER — SODIUM CHLORIDE 0.9 % IV SOLN
420.0000 mg | Freq: Once | INTRAVENOUS | Status: AC
  Administered 2019-07-21: 420 mg via INTRAVENOUS
  Filled 2019-07-21: qty 14

## 2019-07-21 MED ORDER — SODIUM CHLORIDE 0.9% FLUSH
10.0000 mL | INTRAVENOUS | Status: DC | PRN
  Administered 2019-07-21: 10 mL
  Filled 2019-07-21: qty 10

## 2019-07-21 MED ORDER — TRASTUZUMAB CHEMO 150 MG IV SOLR
300.0000 mg | Freq: Once | INTRAVENOUS | Status: AC
  Administered 2019-07-21: 300 mg via INTRAVENOUS
  Filled 2019-07-21: qty 14.29

## 2019-07-21 NOTE — Patient Instructions (Signed)
Pertuzumab injection What is this medicine? PERTUZUMAB (per TOOZ ue mab) is a monoclonal antibody. It is used to treat breast cancer. This medicine may be used for other purposes; ask your health care provider or pharmacist if you have questions. COMMON BRAND NAME(S): PERJETA What should I tell my health care provider before I take this medicine? They need to know if you have any of these conditions:  heart disease  heart failure  high blood pressure  history of irregular heart beat  recent or ongoing radiation therapy  an unusual or allergic reaction to pertuzumab, other medicines, foods, dyes, or preservatives  pregnant or trying to get pregnant  breast-feeding How should I use this medicine? This medicine is for infusion into a vein. It is given by a health care professional in a hospital or clinic setting. Talk to your pediatrician regarding the use of this medicine in children. Special care may be needed. Overdosage: If you think you have taken too much of this medicine contact a poison control center or emergency room at once. NOTE: This medicine is only for you. Do not share this medicine with others. What if I miss a dose? It is important not to miss your dose. Call your doctor or health care professional if you are unable to keep an appointment. What may interact with this medicine? Interactions are not expected. Give your health care provider a list of all the medicines, herbs, non-prescription drugs, or dietary supplements you use. Also tell them if you smoke, drink alcohol, or use illegal drugs. Some items may interact with your medicine. This list may not describe all possible interactions. Give your health care provider a list of all the medicines, herbs, non-prescription drugs, or dietary supplements you use. Also tell them if you smoke, drink alcohol, or use illegal drugs. Some items may interact with your medicine. What should I watch for while using this  medicine? Your condition will be monitored carefully while you are receiving this medicine. Report any side effects. Continue your course of treatment even though you feel ill unless your doctor tells you to stop. Do not become pregnant while taking this medicine or for 7 months after stopping it. Women should inform their doctor if they wish to become pregnant or think they might be pregnant. Women of child-bearing potential will need to have a negative pregnancy test before starting this medicine. There is a potential for serious side effects to an unborn child. Talk to your health care professional or pharmacist for more information. Do not breast-feed an infant while taking this medicine or for 7 months after stopping it. Women must use effective birth control with this medicine. Call your doctor or health care professional for advice if you get a fever, chills or sore throat, or other symptoms of a cold or flu. Do not treat yourself. Try to avoid being around people who are sick. You may experience fever, chills, and headache during the infusion. Report any side effects during the infusion to your health care professional. What side effects may I notice from receiving this medicine? Side effects that you should report to your doctor or health care professional as soon as possible:  breathing problems  chest pain or palpitations  dizziness  feeling faint or lightheaded  fever or chills  skin rash, itching or hives  sore throat  swelling of the face, lips, or tongue  swelling of the legs or ankles  unusually weak or tired Side effects that usually do not require  medical attention (report to your doctor or health care professional if they continue or are bothersome):  diarrhea  hair loss  nausea, vomiting  tiredness This list may not describe all possible side effects. Call your doctor for medical advice about side effects. You may report side effects to FDA at  1-800-FDA-1088. Where should I keep my medicine? This drug is given in a hospital or clinic and will not be stored at home. NOTE: This sheet is a summary. It may not cover all possible information. If you have questions about this medicine, talk to your doctor, pharmacist, or health care provider.  2020 Elsevier/Gold Standard (2015-11-23 12:08:50) Trastuzumab injection for infusion What is this medicine? TRASTUZUMAB (tras TOO zoo mab) is a monoclonal antibody. It is used to treat breast cancer and stomach cancer. This medicine may be used for other purposes; ask your health care provider or pharmacist if you have questions. COMMON BRAND NAME(S): Herceptin, Herzuma, KANJINTI, Ogivri, Ontruzant, Trazimera What should I tell my health care provider before I take this medicine? They need to know if you have any of these conditions:  heart disease  heart failure  lung or breathing disease, like asthma  an unusual or allergic reaction to trastuzumab, benzyl alcohol, or other medications, foods, dyes, or preservatives  pregnant or trying to get pregnant  breast-feeding How should I use this medicine? This drug is given as an infusion into a vein. It is administered in a hospital or clinic by a specially trained health care professional. Talk to your pediatrician regarding the use of this medicine in children. This medicine is not approved for use in children. Overdosage: If you think you have taken too much of this medicine contact a poison control center or emergency room at once. NOTE: This medicine is only for you. Do not share this medicine with others. What if I miss a dose? It is important not to miss a dose. Call your doctor or health care professional if you are unable to keep an appointment. What may interact with this medicine? This medicine may interact with the following medications:  certain types of chemotherapy, such as daunorubicin, doxorubicin, epirubicin, and  idarubicin This list may not describe all possible interactions. Give your health care provider a list of all the medicines, herbs, non-prescription drugs, or dietary supplements you use. Also tell them if you smoke, drink alcohol, or use illegal drugs. Some items may interact with your medicine. What should I watch for while using this medicine? Visit your doctor for checks on your progress. Report any side effects. Continue your course of treatment even though you feel ill unless your doctor tells you to stop. Call your doctor or health care professional for advice if you get a fever, chills or sore throat, or other symptoms of a cold or flu. Do not treat yourself. Try to avoid being around people who are sick. You may experience fever, chills and shaking during your first infusion. These effects are usually mild and can be treated with other medicines. Report any side effects during the infusion to your health care professional. Fever and chills usually do not happen with later infusions. Do not become pregnant while taking this medicine or for 7 months after stopping it. Women should inform their doctor if they wish to become pregnant or think they might be pregnant. Women of child-bearing potential will need to have a negative pregnancy test before starting this medicine. There is a potential for serious side effects to an unborn   child. Talk to your health care professional or pharmacist for more information. Do not breast-feed an infant while taking this medicine or for 7 months after stopping it. Women must use effective birth control with this medicine. What side effects may I notice from receiving this medicine? Side effects that you should report to your doctor or health care professional as soon as possible:  allergic reactions like skin rash, itching or hives, swelling of the face, lips, or tongue  chest pain or palpitations  cough  dizziness  feeling faint or lightheaded,  falls  fever  general ill feeling or flu-like symptoms  signs of worsening heart failure like breathing problems; swelling in your legs and feet  unusually weak or tired Side effects that usually do not require medical attention (report to your doctor or health care professional if they continue or are bothersome):  bone pain  changes in taste  diarrhea  joint pain  nausea/vomiting  weight loss This list may not describe all possible side effects. Call your doctor for medical advice about side effects. You may report side effects to FDA at 1-800-FDA-1088. Where should I keep my medicine? This drug is given in a hospital or clinic and will not be stored at home. NOTE: This sheet is a summary. It may not cover all possible information. If you have questions about this medicine, talk to your doctor, pharmacist, or health care provider.  2020 Elsevier/Gold Standard (2016-10-15 14:37:52)  

## 2019-07-21 NOTE — Progress Notes (Signed)
Hematology and Oncology Follow Up Visit  Taylor Flynn [id number] 18-Nov-1975 43 y.o. 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  As always, it is a lot of fun talking to Dr. Amalia Hailey.  She went to [employer].  She graduated I think about 10 years or so after myself.  She is actually working right now.  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  She still is going to her naturopathic physicians.  They are helping her with issues that she is having.  She still does not want to take any IV iron.  Her iron levels when we last saw show that her ferritin was 4 with an iron saturation of 5%.  Today, her iron levels show a saturation of 18%.  She is complaining of some right side abdominal pain.  This really does not radiate.  Is not associated with any nausea or vomiting.  She would like to have a ultrasound.  We will see when ultrasound might show.  I would not think that there is anything related to her breast cancer.    Her last CA 27.29 was 11.  Her last PET scan was done back in June.  She really wants to try to be conservative with getting x-rays.  Her last echocardiogram was back in May.  This showed an ejection fraction of 60-65%.   Her appetite is pretty good.  Again she is watching what she eats.  Overall, her performance status is ECOG 1.   Medications:  Allergies as of 07/21/2019      Reactions   Aspirin Swelling   Angioedema    Sulfa Antibiotics Rash      Medication List       Accurate as of July 21, 2019 11:34 AM. If you have any questions, ask your nurse or doctor.        STOP taking these medications   Probiotic Caps Stopped by: [name], MD   VITAMIN D-VITAMIN K PO Stopped by: [name], MD     TAKE these medications   BROMELAIN PO Take 1 capsule by mouth daily.   COQ10 PO Take 1 Dose by mouth 2 (two) times daily.   CURCUMIN 95 PO Take 2 capsules by mouth daily.   EPINEPHrine 0.3 mg/0.3 mL Soaj injection Commonly known as: EPI-PEN Inject 0.3 mg into the muscle as needed for anaphylaxis.   EUROPEAN MISTLETOE [place] Inject 100 mg into the skin every Monday, Wednesday, and Friday.   ivermectin 3 MG Tabs tablet Commonly known as: STROMECTOL Take 6 mg by mouth daily.   lidocaine-prilocaine cream Commonly known as: EMLA Apply 1 application topically as needed.   MELATONIN PO Take 40 mg by mouth every evening.   NALTREXONE HCL PO Take 4.5 mg by mouth See admin instructions. Take 4.5 mg by mouth at night Wen through [place]   NON FORMULARY 2 (two) times daily. Pancreatic enzymes 3 caps AC and 2 caps with meals.   NON FORMULARY BrocElite 2 capsules BID with meals.   OVER THE COUNTER MEDICATION Take 1-2 capsules by mouth See admin instructions. lumbrokinase - Take 1 capsule in the morning 2 capsules at lunch and 1 capsule at  bedtime   VITAMIN K2 PO Take 15 drops by mouth 3 (three) times daily.       Allergies:  Allergies  Allergen Reactions  . Aspirin Swelling    Angioedema   . Sulfa Antibiotics Rash    Past Medical History, Surgical history, Social history, and Family History were reviewed and updated.  Review of Systems: Review of Systems  Constitutional: Negative.   HENT: Negative.   Eyes: Negative.   Respiratory: Negative.   Cardiovascular: Negative.   Gastrointestinal: Negative.   Genitourinary: Negative.   Musculoskeletal: Negative.   Skin: Negative.   Neurological: Negative.   Endo/Heme/Allergies: Negative.   Psychiatric/Behavioral: Negative.      Physical Exam:  weight is 113 lb (51.3 kg). Her temporal temperature is 97.6 F (36.4 C). Her blood pressure is 113/69  and her pulse is 62. Her respiration is 18 and oxygen saturation is 100%.   Wt Readings from Last 3 Encounters:  07/21/19 113 lb (51.3 kg)  06/09/19 116 lb (52.6 kg)  04/21/19 116 lb (52.6 kg)    Physical Exam Vitals signs reviewed.  HENT:     Head: Normocephalic and atraumatic.  Eyes:     Pupils: Pupils are equal, round, and reactive to light.  Neck:     Musculoskeletal: Normal range of motion.  Cardiovascular:     Rate and Rhythm: Normal rate and regular rhythm.     Heart sounds: Normal heart sounds.  Pulmonary:     Effort: Pulmonary effort is normal.     Breath sounds: Normal breath sounds.  Abdominal:     General: Bowel sounds are normal.     Palpations: Abdomen is soft.  Musculoskeletal: Normal range of motion.        General: No tenderness or deformity.  Lymphadenopathy:     Cervical: No cervical adenopathy.  Skin:    General: Skin is warm and dry.     Findings: No erythema or rash.  Neurological:     Mental Status: She is alert and oriented to person, place, and time.  Psychiatric:        Behavior: Behavior normal.        Thought Content: Thought content normal.        Judgment: Judgment normal.      Lab Results  Component Value Date   WBC 3.8 (L) 07/21/2019   HGB 9.7 (L) 07/21/2019   HCT 30.9 (L) 07/21/2019   MCV 87.3 07/21/2019   PLT 240 07/21/2019   Lab Results  Component Value Date   FERRITIN 4 (L) 06/09/2019   IRON 21 (L) 06/09/2019   TIBC 426 06/09/2019   UIBC 405 (H) 06/09/2019   IRONPCTSAT 5 (L) 06/09/2019   Lab Results  Component Value Date   RETICCTPCT 0.8 11/17/2018   RBC 3.54 (L) 07/21/2019   No results found for: KPAFRELGTCHN, LAMBDASER, KAPLAMBRATIO No results found for: IGGSERUM, IGA, IGMSERUM No results found for: Kathrynn Ducking, MSPIKE, SPEI   Chemistry      Component Value Date/Time   NA 138 07/21/2019 1042   K 3.8 07/21/2019 1042   CL 103 07/21/2019 1042   CO2 26 07/21/2019  1042   BUN 11 07/21/2019 1042   CREATININE 0.97 07/21/2019 1042      Component Value Date/Time   CALCIUM 9.4 07/21/2019 1042   ALKPHOS 47 07/21/2019 1042   AST 20 07/21/2019 1042   ALT 14 07/21/2019 1042   BILITOT 0.5 07/21/2019 1042  Impression and Plan: Taylor Flynn is a very pleasant 43 yo [place] American female with metastatic breast cancer. Thankfully, her tumor is HER-2 positive.  We will continue to treat her with the Herceptin/Perjeta combination.  She does not want chemotherapy.  Again she is going to a naturopathic oncologist.  She apparently is helping Dr. Amalia Hailey with treatment also.  For right now, I do not see that we have to do any scans on her.  We will get the ultrasound and see how that looks.  We will have her come back to see [place] in another 3 or 4 weeks.  As always, we have good fellowship.  She does have a very strong faith.   [name], MD 9/16/202011:34 AM

## 2019-07-21 NOTE — Patient Instructions (Signed)

## 2019-07-22 ENCOUNTER — Telehealth: Payer: Self-pay | Admitting: Hematology & Oncology

## 2019-07-22 NOTE — Telephone Encounter (Signed)
Spoke with patient to confirm 10/14 appt at 0800 per 9/16 LOS

## 2019-07-27 ENCOUNTER — Ambulatory Visit (HOSPITAL_BASED_OUTPATIENT_CLINIC_OR_DEPARTMENT_OTHER)

## 2019-07-29 ENCOUNTER — Ambulatory Visit (HOSPITAL_BASED_OUTPATIENT_CLINIC_OR_DEPARTMENT_OTHER)
Admission: RE | Admit: 2019-07-29 | Discharge: 2019-07-29 | Disposition: A | Discharge status: Home / Self Care | Source: Ambulatory Visit | Attending: Hematology & Oncology | Admitting: Hematology & Oncology

## 2019-07-29 ENCOUNTER — Other Ambulatory Visit: Payer: Self-pay

## 2019-07-29 DIAGNOSIS — C787 Secondary malignant neoplasm of liver and intrahepatic bile duct: Secondary | ICD-10-CM | POA: Insufficient documentation

## 2019-07-29 DIAGNOSIS — C50919 Malignant neoplasm of unspecified site of unspecified female breast: Secondary | ICD-10-CM | POA: Diagnosis present

## 2019-07-30 ENCOUNTER — Encounter: Payer: Self-pay | Admitting: *Deleted

## 2019-08-11 ENCOUNTER — Ambulatory Visit

## 2019-08-11 ENCOUNTER — Other Ambulatory Visit

## 2019-08-11 ENCOUNTER — Ambulatory Visit: Admitting: Family

## 2019-08-18 ENCOUNTER — Inpatient Hospital Stay

## 2019-08-18 ENCOUNTER — Inpatient Hospital Stay: Attending: Hematology & Oncology | Admitting: Family

## 2019-08-20 ENCOUNTER — Other Ambulatory Visit: Payer: Self-pay | Admitting: Hematology & Oncology

## 2019-08-25 ENCOUNTER — Inpatient Hospital Stay

## 2019-08-25 ENCOUNTER — Inpatient Hospital Stay: Admitting: Family

## 2019-08-27 ENCOUNTER — Other Ambulatory Visit

## 2019-08-27 ENCOUNTER — Ambulatory Visit: Admitting: Family

## 2019-08-27 ENCOUNTER — Ambulatory Visit

## 2019-08-31 ENCOUNTER — Other Ambulatory Visit: Payer: Self-pay | Admitting: Family

## 2019-09-01 ENCOUNTER — Inpatient Hospital Stay

## 2019-09-01 ENCOUNTER — Inpatient Hospital Stay: Admitting: Family

## 2019-09-14 ENCOUNTER — Other Ambulatory Visit

## 2019-09-14 ENCOUNTER — Ambulatory Visit

## 2019-09-14 ENCOUNTER — Ambulatory Visit: Admitting: Family

## 2019-10-14 ENCOUNTER — Inpatient Hospital Stay (HOSPITAL_BASED_OUTPATIENT_CLINIC_OR_DEPARTMENT_OTHER): Admitting: Family

## 2019-10-14 ENCOUNTER — Telehealth: Payer: Self-pay | Admitting: Hematology & Oncology

## 2019-10-14 ENCOUNTER — Other Ambulatory Visit: Payer: Self-pay

## 2019-10-14 ENCOUNTER — Encounter: Payer: Self-pay | Admitting: Family

## 2019-10-14 ENCOUNTER — Inpatient Hospital Stay

## 2019-10-14 ENCOUNTER — Inpatient Hospital Stay: Attending: Hematology & Oncology

## 2019-10-14 VITALS — BP 107/59 | HR 62 | Temp 97.7°F | Resp 18 | Ht 63.5 in | Wt 116.0 lb

## 2019-10-14 DIAGNOSIS — Z171 Estrogen receptor negative status [ER-]: Secondary | ICD-10-CM

## 2019-10-14 DIAGNOSIS — D509 Iron deficiency anemia, unspecified: Secondary | ICD-10-CM | POA: Diagnosis not present

## 2019-10-14 DIAGNOSIS — Z5112 Encounter for antineoplastic immunotherapy: Secondary | ICD-10-CM | POA: Insufficient documentation

## 2019-10-14 DIAGNOSIS — C787 Secondary malignant neoplasm of liver and intrahepatic bile duct: Secondary | ICD-10-CM

## 2019-10-14 DIAGNOSIS — C792 Secondary malignant neoplasm of skin: Secondary | ICD-10-CM

## 2019-10-14 DIAGNOSIS — C50812 Malignant neoplasm of overlapping sites of left female breast: Secondary | ICD-10-CM

## 2019-10-14 DIAGNOSIS — C50912 Malignant neoplasm of unspecified site of left female breast: Secondary | ICD-10-CM

## 2019-10-14 DIAGNOSIS — C50919 Malignant neoplasm of unspecified site of unspecified female breast: Secondary | ICD-10-CM | POA: Insufficient documentation

## 2019-10-14 DIAGNOSIS — D500 Iron deficiency anemia secondary to blood loss (chronic): Secondary | ICD-10-CM | POA: Diagnosis not present

## 2019-10-14 LAB — CBC WITH DIFFERENTIAL (CANCER CENTER ONLY)
Abs Immature Granulocytes: 0 10*3/uL (ref 0.00–0.07)
Basophils Absolute: 0 10*3/uL (ref 0.0–0.1)
Basophils Relative: 1 %
Eosinophils Absolute: 0.1 10*3/uL (ref 0.0–0.5)
Eosinophils Relative: 1 %
HCT: 29.7 % — ABNORMAL LOW (ref 36.0–46.0)
Hemoglobin: 9.2 g/dL — ABNORMAL LOW (ref 12.0–15.0)
Immature Granulocytes: 0 %
Lymphocytes Relative: 34 %
Lymphs Abs: 1.3 10*3/uL (ref 0.7–4.0)
MCH: 25.8 pg — ABNORMAL LOW (ref 26.0–34.0)
MCHC: 31 g/dL (ref 30.0–36.0)
MCV: 83.2 fL (ref 80.0–100.0)
Monocytes Absolute: 0.3 10*3/uL (ref 0.1–1.0)
Monocytes Relative: 8 %
Neutro Abs: 2.2 10*3/uL (ref 1.7–7.7)
Neutrophils Relative %: 56 %
Platelet Count: 263 10*3/uL (ref 150–400)
RBC: 3.57 MIL/uL — ABNORMAL LOW (ref 3.87–5.11)
RDW: 16 % — ABNORMAL HIGH (ref 11.5–15.5)
WBC Count: 3.8 10*3/uL — ABNORMAL LOW (ref 4.0–10.5)
nRBC: 0 % (ref 0.0–0.2)

## 2019-10-14 LAB — CMP (CANCER CENTER ONLY)
ALT: 10 U/L (ref 0–44)
AST: 18 U/L (ref 15–41)
Albumin: 4.4 g/dL (ref 3.5–5.0)
Alkaline Phosphatase: 43 U/L (ref 38–126)
Anion gap: 7 (ref 5–15)
BUN: 19 mg/dL (ref 6–20)
CO2: 27 mmol/L (ref 22–32)
Calcium: 8.9 mg/dL (ref 8.9–10.3)
Chloride: 107 mmol/L (ref 98–111)
Creatinine: 1.03 mg/dL — ABNORMAL HIGH (ref 0.44–1.00)
GFR, Est AFR Am: >60 mL/min (ref >60)
GFR, Estimated: >60 mL/min (ref >60)
Glucose, Bld: 74 mg/dL (ref 70–99)
Potassium: 3.8 mmol/L (ref 3.5–5.1)
Sodium: 141 mmol/L (ref 135–145)
Total Bilirubin: 0.3 mg/dL (ref 0.3–1.2)
Total Protein: 6.8 g/dL (ref 6.5–8.1)

## 2019-10-14 LAB — IRON AND TIBC
Iron: 48 ug/dL (ref 41–142)
Saturation Ratios: 12 % — ABNORMAL LOW (ref 21–57)
TIBC: 405 ug/dL (ref 236–444)
UIBC: 357 ug/dL (ref 120–384)

## 2019-10-14 LAB — FERRITIN: Ferritin: 10 ng/mL — ABNORMAL LOW (ref 11–307)

## 2019-10-14 LAB — SEDIMENTATION RATE: Sed Rate: 14 mm/hr (ref 0–22)

## 2019-10-14 LAB — LACTATE DEHYDROGENASE: LDH: 183 U/L (ref 98–192)

## 2019-10-14 LAB — C-REACTIVE PROTEIN: CRP: 0.5 mg/dL (ref <1.0)

## 2019-10-14 MED ORDER — SODIUM CHLORIDE 0.9% FLUSH
10.0000 mL | INTRAVENOUS | Status: DC | PRN
  Administered 2019-10-14: 10 mL
  Filled 2019-10-14: qty 10

## 2019-10-14 MED ORDER — ZOLEDRONIC ACID 4 MG/100ML IV SOLN
4.0000 mg | Freq: Once | INTRAVENOUS | Status: AC
  Administered 2019-10-14: 4 mg via INTRAVENOUS
  Filled 2019-10-14: qty 100

## 2019-10-14 MED ORDER — TRASTUZUMAB CHEMO 150 MG IV SOLR
300.0000 mg | Freq: Once | INTRAVENOUS | Status: AC
  Administered 2019-10-14: 300 mg via INTRAVENOUS
  Filled 2019-10-14: qty 14.29

## 2019-10-14 MED ORDER — SODIUM CHLORIDE 0.9 % IV SOLN: 420.0000 mg | Freq: Once | INTRAVENOUS | Status: DC

## 2019-10-14 MED ORDER — ACETAMINOPHEN 325 MG PO TABS: 650.0000 mg | ORAL_TABLET | Freq: Once | ORAL | Status: DC

## 2019-10-14 MED ORDER — SODIUM CHLORIDE 0.9 % IV SOLN
Freq: Once | INTRAVENOUS | Status: AC
  Administered 2019-10-14: 11:00:00 via INTRAVENOUS
  Filled 2019-10-14: qty 250

## 2019-10-14 MED ORDER — HEPARIN SOD (PORK) LOCK FLUSH 100 UNIT/ML IV SOLN
500.0000 [IU] | Freq: Once | INTRAVENOUS | Status: AC | PRN
  Administered 2019-10-14: 500 [IU]
  Filled 2019-10-14: qty 5

## 2019-10-14 MED ORDER — TRASTUZUMAB CHEMO 150 MG IV SOLR: 6.0000 mg/kg | Freq: Once | INTRAVENOUS | Status: DC

## 2019-10-14 MED ORDER — DIPHENHYDRAMINE HCL 25 MG PO CAPS: 50.0000 mg | ORAL_CAPSULE | Freq: Once | ORAL | Status: DC

## 2019-10-14 NOTE — Patient Instructions (Signed)
Implanted Port Insertion, Care After This sheet gives you information about how to care for yourself after your procedure. Your health care provider may also give you more specific instructions. If you have problems or questions, contact your health care provider. What can I expect after the procedure? After the procedure, it is common to have:  Discomfort at the port insertion site.  Bruising on the skin over the port. This should improve over 3-4 days. Follow these instructions at home: Port care  After your port is placed, you will get a manufacturer's information card. The card has information about your port. Keep this card with you at all times.  Take care of the port as told by your health care provider. Ask your health care provider if you or a family member can get training for taking care of the port at home. A home health care nurse may also take care of the port.  Make sure to remember what type of port you have. Incision care      Follow instructions from your health care provider about how to take care of your port insertion site. Make sure you: ? Wash your hands with soap and water before and after you change your bandage (dressing). If soap and water are not available, use hand sanitizer. ? Change your dressing as told by your health care provider. ? Leave stitches (sutures), skin glue, or adhesive strips in place. These skin closures may need to stay in place for 2 weeks or longer. If adhesive strip edges start to loosen and curl up, you may trim the loose edges. Do not remove adhesive strips completely unless your health care provider tells you to do that.  Check your port insertion site every day for signs of infection. Check for: ? Redness, swelling, or pain. ? Fluid or blood. ? Warmth. ? Pus or a bad smell. Activity  Return to your normal activities as told by your health care provider. Ask your health care provider what activities are safe for you.  Do not  lift anything that is heavier than 10 lb (4.5 kg), or the limit that you are told, until your health care provider says that it is safe. General instructions  Take over-the-counter and prescription medicines only as told by your health care provider.  Do not take baths, swim, or use a hot tub until your health care provider approves. Ask your health care provider if you may take showers. You may only be allowed to take sponge baths.  Do not drive for 24 hours if you were given a sedative during your procedure.  Wear a medical alert bracelet in case of an emergency. This will tell any health care providers that you have a port.  Keep all follow-up visits as told by your health care provider. This is important. Contact a health care provider if:  You cannot flush your port with saline as directed, or you cannot draw blood from the port.  You have a fever or chills.  You have redness, swelling, or pain around your port insertion site.  You have fluid or blood coming from your port insertion site.  Your port insertion site feels warm to the touch.  You have pus or a bad smell coming from the port insertion site. Get help right away if:  You have chest pain or shortness of breath.  You have bleeding from your port that you cannot control. Summary  Take care of the port as told by your health   care provider. Keep the manufacturer's information card with you at all times.  Change your dressing as told by your health care provider.  Contact a health care provider if you have a fever or chills or if you have redness, swelling, or pain around your port insertion site.  Keep all follow-up visits as told by your health care provider. This information is not intended to replace advice given to you by your health care provider. Make sure you discuss any questions you have with your health care provider. Document Released: 08/11/2013 Document Revised: 05/19/2018 Document Reviewed: 05/19/2018  Elsevier Patient Education  2020 Elsevier Inc.  

## 2019-10-14 NOTE — Patient Instructions (Signed)
Zoledronic Acid injection (Hypercalcemia, Oncology) What is this medicine? ZOLEDRONIC ACID (ZOE le dron ik AS id) lowers the amount of calcium loss from bone. It is used to treat too much calcium in your blood from cancer. It is also used to prevent complications of cancer that has spread to the bone. This medicine may be used for other purposes; ask your health care provider or pharmacist if you have questions. COMMON BRAND NAME(S): Zometa What should I tell my health care provider before I take this medicine? They need to know if you have any of these conditions:  aspirin-sensitive asthma  cancer, especially if you are receiving medicines used to treat cancer  dental disease or wear dentures  infection  kidney disease  receiving corticosteroids like dexamethasone or prednisone  an unusual or allergic reaction to zoledronic acid, other medicines, foods, dyes, or preservatives  pregnant or trying to get pregnant  breast-feeding How should I use this medicine? This medicine is for infusion into a vein. It is given by a health care professional in a hospital or clinic setting. Talk to your pediatrician regarding the use of this medicine in children. Special care may be needed. Overdosage: If you think you have taken too much of this medicine contact a poison control center or emergency room at once. NOTE: This medicine is only for you. Do not share this medicine with others. What if I miss a dose? It is important not to miss your dose. Call your doctor or health care professional if you are unable to keep an appointment. What may interact with this medicine?  certain antibiotics given by injection  NSAIDs, medicines for pain and inflammation, like ibuprofen or naproxen  some diuretics like bumetanide, furosemide  teriparatide  thalidomide This list may not describe all possible interactions. Give your health care provider a list of all the medicines, herbs, non-prescription  drugs, or dietary supplements you use. Also tell them if you smoke, drink alcohol, or use illegal drugs. Some items may interact with your medicine. What should I watch for while using this medicine? Visit your doctor or health care professional for regular checkups. It may be some time before you see the benefit from this medicine. Do not stop taking your medicine unless your doctor tells you to. Your doctor may order blood tests or other tests to see how you are doing. Women should inform their doctor if they wish to become pregnant or think they might be pregnant. There is a potential for serious side effects to an unborn child. Talk to your health care professional or pharmacist for more information. You should make sure that you get enough calcium and vitamin D while you are taking this medicine. Discuss the foods you eat and the vitamins you take with your health care professional. Some people who take this medicine have severe bone, joint, and/or muscle pain. This medicine may also increase your risk for jaw problems or a broken thigh bone. Tell your doctor right away if you have severe pain in your jaw, bones, joints, or muscles. Tell your doctor if you have any pain that does not go away or that gets worse. Tell your dentist and dental surgeon that you are taking this medicine. You should not have major dental surgery while on this medicine. See your dentist to have a dental exam and fix any dental problems before starting this medicine. Take good care of your teeth while on this medicine. Make sure you see your dentist for regular follow-up   appointments. What side effects may I notice from receiving this medicine? Side effects that you should report to your doctor or health care professional as soon as possible:  allergic reactions like skin rash, itching or hives, swelling of the face, lips, or tongue  anxiety, confusion, or depression  breathing problems  changes in vision  eye  pain  feeling faint or lightheaded, falls  jaw pain, especially after dental work  mouth sores  muscle cramps, stiffness, or weakness  redness, blistering, peeling or loosening of the skin, including inside the mouth  trouble passing urine or change in the amount of urine Side effects that usually do not require medical attention (report to your doctor or health care professional if they continue or are bothersome):  bone, joint, or muscle pain  constipation  diarrhea  fever  hair loss  irritation at site where injected  loss of appetite  nausea, vomiting  stomach upset  trouble sleeping  trouble swallowing  weak or tired This list may not describe all possible side effects. Call your doctor for medical advice about side effects. You may report side effects to FDA at 1-800-FDA-1088. Where should I keep my medicine? This drug is given in a hospital or clinic and will not be stored at home. NOTE: This sheet is a summary. It may not cover all possible information. If you have questions about this medicine, talk to your doctor, pharmacist, or health care provider.  2020 Elsevier/Gold Standard (2014-03-19 14:19:39) Trastuzumab injection for infusion What is this medicine? TRASTUZUMAB (tras TOO zoo mab) is a monoclonal antibody. It is used to treat breast cancer and stomach cancer. This medicine may be used for other purposes; ask your health care provider or pharmacist if you have questions. COMMON BRAND NAME(S): Herceptin, Galvin Proffer, Trazimera What should I tell my health care provider before I take this medicine? They need to know if you have any of these conditions:  heart disease  heart failure  lung or breathing disease, like asthma  an unusual or allergic reaction to trastuzumab, benzyl alcohol, or other medications, foods, dyes, or preservatives  pregnant or trying to get pregnant  breast-feeding How should I use this  medicine? This drug is given as an infusion into a vein. It is administered in a hospital or clinic by a specially trained health care professional. Talk to your pediatrician regarding the use of this medicine in children. This medicine is not approved for use in children. Overdosage: If you think you have taken too much of this medicine contact a poison control center or emergency room at once. NOTE: This medicine is only for you. Do not share this medicine with others. What if I miss a dose? It is important not to miss a dose. Call your doctor or health care professional if you are unable to keep an appointment. What may interact with this medicine? This medicine may interact with the following medications:  certain types of chemotherapy, such as daunorubicin, doxorubicin, epirubicin, and idarubicin This list may not describe all possible interactions. Give your health care provider a list of all the medicines, herbs, non-prescription drugs, or dietary supplements you use. Also tell them if you smoke, drink alcohol, or use illegal drugs. Some items may interact with your medicine. What should I watch for while using this medicine? Visit your doctor for checks on your progress. Report any side effects. Continue your course of treatment even though you feel ill unless your doctor tells you to stop.  Call your doctor or health care professional for advice if you get a fever, chills or sore throat, or other symptoms of a cold or flu. Do not treat yourself. Try to avoid being around people who are sick. You may experience fever, chills and shaking during your first infusion. These effects are usually mild and can be treated with other medicines. Report any side effects during the infusion to your health care professional. Fever and chills usually do not happen with later infusions. Do not become pregnant while taking this medicine or for 7 months after stopping it. Women should inform their doctor if  they wish to become pregnant or think they might be pregnant. Women of child-bearing potential will need to have a negative pregnancy test before starting this medicine. There is a potential for serious side effects to an unborn child. Talk to your health care professional or pharmacist for more information. Do not breast-feed an infant while taking this medicine or for 7 months after stopping it. Women must use effective birth control with this medicine. What side effects may I notice from receiving this medicine? Side effects that you should report to your doctor or health care professional as soon as possible:  allergic reactions like skin rash, itching or hives, swelling of the face, lips, or tongue  chest pain or palpitations  cough  dizziness  feeling faint or lightheaded, falls  fever  general ill feeling or flu-like symptoms  signs of worsening heart failure like breathing problems; swelling in your legs and feet  unusually weak or tired Side effects that usually do not require medical attention (report to your doctor or health care professional if they continue or are bothersome):  bone pain  changes in taste  diarrhea  joint pain  nausea/vomiting  weight loss This list may not describe all possible side effects. Call your doctor for medical advice about side effects. You may report side effects to FDA at 1-800-FDA-1088. Where should I keep my medicine? This drug is given in a hospital or clinic and will not be stored at home. NOTE: This sheet is a summary. It may not cover all possible information. If you have questions about this medicine, talk to your doctor, pharmacist, or health care provider.  2020 Elsevier/Gold Standard (2016-10-15 14:37:52)

## 2019-10-14 NOTE — Telephone Encounter (Signed)
Patient called in @ 8:39 advising that she was going to be late.  I explained to her again that our office policy was if you were more than 15 minutes late you would need to reschedule. Patient then shows up @ 9:04 after the 15 minute rule.  I went a spoke with her and advised her that since she has been late multiple times in the past and calls to let us know she will be late we will have to reschedule her the next time she is late.  I did advise there that if her appointments are at 8:45 she really needed to be here by 8:30 for registration so she would not be late in the future. She voiced understanding of our office policy.  Dr Marin Olp & Burman Nieves were advised of this as well.

## 2019-10-14 NOTE — Progress Notes (Signed)
Hematology and Oncology Follow Up Visit  Taylor Flynn [id number] 01/16/1976 43 y.o. 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 She is doing quite well but has noted a 1.5 cm x 1 cm mobile nodule at the 1 o'clock position of the left breast. It is palpable on exam. She states that she does have a clip in this area which may be trying to work it's way out. It is a little tender when she moves her arm up and down. She has a small scabbed area at the surface of that part of her skin. She had a comprehensive whole body scan last week in [place] ([place]) which showed a 7 mm nodule at the midline of the left breast.  We will get her back in with her surgeon, Dr. Ingram's, office for further eval and possible excision and biopsy.  No adenopathy noted on exam.  She has occasional mild lymphadenopathy in the left arm and performs lymphatic massage as needed.  No fever, chills, n/v, cough, rash, dizziness, SOB, chest pain, palpitations, abdominal pain or changes in bowel or bladder habits.  No swelling, tenderness, numbness or tingling in her extremities.  No falls or syncopal episodes.  She is eating a healthy, well balanced diet and staying hydrated. Her weight is stable at 116 lbs.  No episodes of bleeding, no bruising or petechiae.  She states that she feels that she is going through the change of life. She has not had a cycle since February 2020.   ECOG Performance Status: 1 - Symptomatic but completely ambulatory  Medications:  Allergies as of 10/14/2019      Reactions   Aspirin Swelling   Angioedema    Sulfa Antibiotics Rash      Medication List       Accurate as of October 14, 2019  9:57 AM. If you have any questions, ask your nurse or doctor.         BROMELAIN PO Take 1 capsule by mouth daily.   COQ10 PO Take 1 Dose by mouth 2 (two) times daily.   CURCUMIN 95 PO Take 2 capsules by mouth daily.   EPINEPHrine 0.3 mg/0.3 mL Soaj injection Commonly known as: EPI-PEN Inject 0.3 mg into the muscle as needed for anaphylaxis.   EUROPEAN MISTLETOE Nash Inject 100 mg into the skin every Monday, Wednesday, and Friday.   ivermectin 3 MG Tabs tablet Commonly known as: STROMECTOL Take 6 mg by mouth daily.   lidocaine-prilocaine cream Commonly known as: EMLA Apply 1 application topically as needed.   MELATONIN PO Take 40 mg by mouth every evening.   NALTREXONE HCL PO Take 4.5 mg by mouth See admin instructions. Take 4.5 mg by mouth at night Wen through Sat   NON FORMULARY 2 (two) times daily. Pancreatic enzymes 3 caps AC and 2 caps with meals.   NON FORMULARY BrocElite 2 capsules BID with meals.   OVER THE COUNTER MEDICATION Take 1-2 capsules by mouth See admin instructions. lumbrokinase - Take 1 capsule in the morning 2 capsules at lunch and 1 capsule at bedtime   VITAMIN K2 PO Take 15 drops by mouth 3 (three) times daily.       Allergies:  Allergies  Allergen Reactions  . Aspirin Swelling    Angioedema   .   Sulfa Antibiotics Rash    Past Medical History, Surgical history, Social history, and Family History were reviewed and updated.  Review of Systems: All other 10 point review of systems is negative.   Physical Exam:  vitals were not taken for this visit.   Wt Readings from Last 3 Encounters:  07/21/19 113 lb (51.3 kg)  06/09/19 116 lb (52.6 kg)  04/21/19 116 lb (52.6 kg)    Ocular: Sclerae unicteric, pupils equal, round and reactive to light Ear-nose-throat: Oropharynx clear, dentition fair Lymphatic: No cervical or supraclavicular adenopathy Lungs no rales or rhonchi, good excursion bilaterally Heart regular rate and rhythm, no murmur appreciated Abd soft, nontender, positive bowel sounds, no  liver or spleen tip palpated on exam, no fluid wave  MSK no focal spinal tenderness, no joint edema Neuro: non-focal, well-oriented, appropriate affect Breasts: Same as above. No changes with right breast.   Lab Results  Component Value Date   WBC 3.8 (L) 10/14/2019   HGB 9.2 (L) 10/14/2019   HCT 29.7 (L) 10/14/2019   MCV 83.2 10/14/2019   PLT 263 10/14/2019   Lab Results  Component Value Date   FERRITIN 8 (L) 07/21/2019   IRON 71 07/21/2019   TIBC 393 07/21/2019   UIBC 321 07/21/2019   IRONPCTSAT 18 (L) 07/21/2019   Lab Results  Component Value Date   RETICCTPCT 0.8 11/17/2018   RBC 3.57 (L) 10/14/2019   No results found for: KPAFRELGTCHN, LAMBDASER, KAPLAMBRATIO No results found for: IGGSERUM, IGA, IGMSERUM No results found for: TOTALPROTELP, ALBUMINELP, A1GS, A2GS, BETS, BETA2SER, GAMS, MSPIKE, SPEI   Chemistry      Component Value Date/Time   NA 138 07/21/2019 1042   K 3.8 07/21/2019 1042   CL 103 07/21/2019 1042   CO2 26 07/21/2019 1042   BUN 11 07/21/2019 1042   CREATININE 0.97 07/21/2019 1042      Component Value Date/Time   CALCIUM 9.4 07/21/2019 1042   ALKPHOS 47 07/21/2019 1042   AST 20 07/21/2019 1042   ALT 14 07/21/2019 1042   BILITOT 0.5 07/21/2019 1042       Impression and Plan: Taylor Flynn is a pleasant 43 yo African American female with metastatic breast cancer, HER-2 positive. August CA 27.29 was 10.9.  We will proceed with Herceptin and Zometa today as planned. She does not want the Perjeta at this time as she feels this may cause an element of pancreatitis. Her amylase and lipase were recently elevated when she visited her Naturopathic oncologist.  She also asked that we draw an LDH, CRP and ESR which we have added.  We are referring her back to a provider with Dr. Ingram's practice. Dr. Ingram is retiring next week. We will plan to see her back in another 4 weeks.  She will contact our office with any questions or concerns. We can certainly see  her sooner if needed.   Sarah [place], NP 12/10/20209:57 AM  

## 2019-10-15 LAB — LUTEINIZING HORMONE: LH: 45 m[IU]/mL

## 2019-10-15 LAB — FOLLICLE STIMULATING HORMONE: FSH: 110 m[IU]/mL

## 2019-10-15 LAB — CANCER ANTIGEN 27.29: CA 27.29: 15.1 U/mL (ref 0.0–38.6)

## 2019-10-19 LAB — ESTRADIOL, ULTRA SENS: Estradiol, Sensitive: <2.5 pg/mL

## 2019-10-25 ENCOUNTER — Telehealth: Payer: Self-pay | Admitting: *Deleted

## 2019-10-25 NOTE — Telephone Encounter (Signed)
Received a call from patient asking if we would refer her to [facility] breast surgery department.  Requesting Dr. Elisha Headland.  Phone number [phone]. Called departement and talked with [facility] who after reviewing records and patient history stated they could not see her without a mammogram.  Was unable to find mammogram in patient record.,  Called patient .  She only had a mammogram in [place] back in 2017 upon diagnosis and not since.  Patient states that she will call the office and talk with them about records then call me back to proceed.

## 2019-11-15 ENCOUNTER — Inpatient Hospital Stay: Admitting: Family

## 2019-11-15 ENCOUNTER — Inpatient Hospital Stay

## 2019-11-15 ENCOUNTER — Encounter: Payer: Self-pay | Admitting: Pharmacist

## 2019-11-15 ENCOUNTER — Telehealth: Payer: Self-pay | Admitting: *Deleted

## 2019-11-15 NOTE — Telephone Encounter (Signed)
Message received from patient requesting that Herceptin dose be decreased by 25% starting with her next dose.  Dr. Marin Olp notified and OK to decrease dose of Herceptin by 25% per pt.'s request.  Call placed back to patient and pt notified that Dr. Marin Olp is OK to decrease Herceptin by 25%.  Pt appreciative of call back and has no further questions or concerns at this time.

## 2019-11-16 NOTE — Progress Notes (Signed)
Received communication from MD. Decrease Herceptin dose by 25% per patient request.  Herceptin decreased from 6mg /kg to 4.5mg /kg as requested.  Hardie Pulley, PharmD, BCPS, BCOP

## 2019-11-23 ENCOUNTER — Inpatient Hospital Stay

## 2019-11-23 ENCOUNTER — Other Ambulatory Visit: Payer: Self-pay

## 2019-11-23 ENCOUNTER — Telehealth: Payer: Self-pay | Admitting: Family

## 2019-11-23 ENCOUNTER — Inpatient Hospital Stay: Attending: Family

## 2019-11-23 ENCOUNTER — Encounter: Payer: Self-pay | Admitting: Family

## 2019-11-23 ENCOUNTER — Inpatient Hospital Stay (HOSPITAL_BASED_OUTPATIENT_CLINIC_OR_DEPARTMENT_OTHER): Admitting: Family

## 2019-11-23 VITALS — BP 132/85 | HR 69 | Temp 97.1°F | Resp 17 | Ht 63.5 in | Wt 114.0 lb

## 2019-11-23 DIAGNOSIS — E611 Iron deficiency: Secondary | ICD-10-CM | POA: Insufficient documentation

## 2019-11-23 DIAGNOSIS — D500 Iron deficiency anemia secondary to blood loss (chronic): Secondary | ICD-10-CM

## 2019-11-23 DIAGNOSIS — C787 Secondary malignant neoplasm of liver and intrahepatic bile duct: Secondary | ICD-10-CM

## 2019-11-23 DIAGNOSIS — C50912 Malignant neoplasm of unspecified site of left female breast: Secondary | ICD-10-CM | POA: Diagnosis not present

## 2019-11-23 DIAGNOSIS — C50919 Malignant neoplasm of unspecified site of unspecified female breast: Secondary | ICD-10-CM | POA: Insufficient documentation

## 2019-11-23 DIAGNOSIS — Z171 Estrogen receptor negative status [ER-]: Secondary | ICD-10-CM | POA: Diagnosis not present

## 2019-11-23 DIAGNOSIS — Z5112 Encounter for antineoplastic immunotherapy: Secondary | ICD-10-CM | POA: Diagnosis not present

## 2019-11-23 DIAGNOSIS — C792 Secondary malignant neoplasm of skin: Secondary | ICD-10-CM

## 2019-11-23 DIAGNOSIS — K8689 Other specified diseases of pancreas: Secondary | ICD-10-CM

## 2019-11-23 LAB — CBC WITH DIFFERENTIAL (CANCER CENTER ONLY)
Abs Immature Granulocytes: 0.02 10*3/uL (ref 0.00–0.07)
Basophils Absolute: 0 10*3/uL (ref 0.0–0.1)
Basophils Relative: 1 %
Eosinophils Absolute: 0 10*3/uL (ref 0.0–0.5)
Eosinophils Relative: 1 %
HCT: 31.2 % — ABNORMAL LOW (ref 36.0–46.0)
Hemoglobin: 9.5 g/dL — ABNORMAL LOW (ref 12.0–15.0)
Immature Granulocytes: 0 %
Lymphocytes Relative: 21 %
Lymphs Abs: 1.2 10*3/uL (ref 0.7–4.0)
MCH: 25.6 pg — ABNORMAL LOW (ref 26.0–34.0)
MCHC: 30.4 g/dL (ref 30.0–36.0)
MCV: 84.1 fL (ref 80.0–100.0)
Monocytes Absolute: 0.4 10*3/uL (ref 0.1–1.0)
Monocytes Relative: 6 %
Neutro Abs: 4.3 10*3/uL (ref 1.7–7.7)
Neutrophils Relative %: 71 %
Platelet Count: 287 10*3/uL (ref 150–400)
RBC: 3.71 MIL/uL — ABNORMAL LOW (ref 3.87–5.11)
RDW: 16.7 % — ABNORMAL HIGH (ref 11.5–15.5)
WBC Count: 6 10*3/uL (ref 4.0–10.5)
nRBC: 0 % (ref 0.0–0.2)

## 2019-11-23 LAB — CMP (CANCER CENTER ONLY)
ALT: 9 U/L (ref 0–44)
AST: 17 U/L (ref 15–41)
Albumin: 4.3 g/dL (ref 3.5–5.0)
Alkaline Phosphatase: 45 U/L (ref 38–126)
Anion gap: 12 (ref 5–15)
BUN: 25 mg/dL — ABNORMAL HIGH (ref 6–20)
CO2: 23 mmol/L (ref 22–32)
Calcium: 9.4 mg/dL (ref 8.9–10.3)
Chloride: 103 mmol/L (ref 98–111)
Creatinine: 0.98 mg/dL (ref 0.44–1.00)
GFR, Est AFR Am: >60 mL/min (ref >60)
GFR, Estimated: >60 mL/min (ref >60)
Glucose, Bld: 64 mg/dL — ABNORMAL LOW (ref 70–99)
Potassium: 4.1 mmol/L (ref 3.5–5.1)
Sodium: 138 mmol/L (ref 135–145)
Total Bilirubin: 0.4 mg/dL (ref 0.3–1.2)
Total Protein: 7.2 g/dL (ref 6.5–8.1)

## 2019-11-23 LAB — IRON AND TIBC
Iron: 45 ug/dL (ref 41–142)
Saturation Ratios: 10 % — ABNORMAL LOW (ref 21–57)
TIBC: 429 ug/dL (ref 236–444)
UIBC: 384 ug/dL (ref 120–384)

## 2019-11-23 LAB — AMYLASE: Amylase: 100 U/L (ref 28–100)

## 2019-11-23 LAB — FERRITIN: Ferritin: 8 ng/mL — ABNORMAL LOW (ref 11–307)

## 2019-11-23 LAB — LIPASE, BLOOD: Lipase: 74 U/L — ABNORMAL HIGH (ref 11–51)

## 2019-11-23 MED ORDER — SODIUM CHLORIDE 0.9% FLUSH
10.0000 mL | INTRAVENOUS | Status: DC | PRN
  Administered 2019-11-23: 10 mL
  Filled 2019-11-23: qty 10

## 2019-11-23 MED ORDER — HEPARIN SOD (PORK) LOCK FLUSH 100 UNIT/ML IV SOLN
500.0000 [IU] | Freq: Once | INTRAVENOUS | Status: AC | PRN
  Administered 2019-11-23: 500 [IU]
  Filled 2019-11-23: qty 5

## 2019-11-23 MED ORDER — TRASTUZUMAB CHEMO 150 MG IV SOLR
4.5000 mg/kg | Freq: Once | INTRAVENOUS | Status: AC
  Administered 2019-11-23: 231 mg via INTRAVENOUS
  Filled 2019-11-23: qty 11

## 2019-11-23 MED ORDER — SODIUM CHLORIDE 0.9% FLUSH
3.0000 mL | INTRAVENOUS | Status: DC | PRN
  Filled 2019-11-23: qty 10

## 2019-11-23 MED ORDER — SODIUM CHLORIDE 0.9 % IV SOLN
Freq: Once | INTRAVENOUS | Status: AC
  Filled 2019-11-23: qty 250

## 2019-11-23 NOTE — Progress Notes (Signed)
Hematology and Oncology Follow Up Visit  Taylor Flynn [id number] 11-24-1975 44 y.o. 11/23/2019   Principle Diagnosis:  Metastatic breast cancer-ER-/HER-2+  Current Therapy:   Herceptin/Perjeta- s/p cycle13(previous cycles given in [place]) Zometa 4 mg IV q 2 monthsdue again inNovember   Interim History:  Taylor Flynn is here today for follow-up and treatment. She was able to go to [facility] and had a left breast lumpectomy, 0.8 cm mass. Pathology showed this as invasive ductal carcinoma , ER -, PR-, HER 2 +. 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  Her incision is healing nicely and is C/D/I. She still has steri strips in place.  She has spoken with Dr. Owens Shark about radiation and is still considering this.  We discussed potentially switching her to [facility] but she is hoping to stay on Herceptin and restart Perjeta once her pancreatitis has resolved. Amylase and Lipase levels are pending.  She has had a couple episodes of chest pain which she attributes to anxiety. She has had a few brief intermittent headaches.  Unfortunately her sweet daddy passed away over this past weekend after battling a long illness. This has been especially hard for her mother.   She is resting when needed. She did fast yesterday and admits that she was not hydrating as she should.  Her BG was a little low this morning but she is currently eating and hydrating well.   No fever, chills, n/v, cough, rash, dizziness, SOB, palpitations, abdominal pain or changes in bowel or bladder habits.  No episodes of bleeding. No bruising or petechiae.  No weakness, swelling, tenderness, numbness or tingling in her extremities.  No falls or syncopal episodes to report.   ECOG Performance Status: 1 - Symptomatic but completely ambulatory  Medications:  Allergies as of 11/23/2019      Reactions   Aspirin Swelling   Angioedema    Sulfa Antibiotics Rash      Medication List       Accurate as of November 23, 2019  9:38 AM.  If you have any questions, ask your nurse or doctor.        EPINEPHrine 0.3 mg/0.3 mL Soaj injection Commonly known as: EPI-PEN Inject 0.3 mg into the muscle as needed for anaphylaxis.   ivermectin 3 MG Tabs tablet Commonly known as: STROMECTOL Take 6 mg by mouth daily.   lidocaine-prilocaine cream Commonly known as: EMLA Apply 1 application topically as needed.   MELATONIN PO Take 40 mg by mouth every evening.   NALTREXONE HCL PO Take 4.5 mg by mouth See admin instructions. Take 4.5 mg by mouth at night Wen through Sat   NON FORMULARY 2 (two) times daily. Pancreatic enzymes 3 caps AC and 2 caps with meals.   VITAMIN K2 PO Take 15 drops by mouth 3 (three) times daily.       Allergies:  Allergies  Allergen Reactions  . Aspirin Swelling    Angioedema   . Sulfa Antibiotics Rash    Past Medical History, Surgical history, Social history, and Family History were reviewed and updated.  Review of Systems: All other 10 point review of systems is negative.   Physical Exam:  weight is 114 lb (51.7 kg). Her temporal temperature is 97.1 F (36.2 C) (abnormal). Her blood pressure is 132/85 and her pulse is 69. Her respiration is 17 and oxygen saturation is 100%.   Wt Readings from Last 3 Encounters:  11/23/19 114 lb (51.7 kg)  10/14/19 116 lb (52.6 kg)  07/21/19 113 lb (  51.3 kg)    Ocular: Sclerae unicteric, pupils equal, round and reactive to light Ear-nose-throat: Oropharynx clear, dentition fair Lymphatic: No cervical or supraclavicular adenopathy Lungs no rales or rhonchi, good excursion bilaterally Heart regular rate and rhythm, no murmur appreciated Abd soft, nontender, positive bowel sounds, no liver or spleen tip palpated on exam, no fluid wave  MSK no focal spinal tenderness, no joint edema Neuro: non-focal, well-oriented, appropriate affect Breasts: Lumpectomy site mid left breast c/d/i, right breast exam negative. No new mass, lesion or rash  Lab Results    Component Value Date   WBC 3.8 (L) 10/14/2019   HGB 9.2 (L) 10/14/2019   HCT 29.7 (L) 10/14/2019   MCV 83.2 10/14/2019   PLT 263 10/14/2019   Lab Results  Component Value Date   FERRITIN 10 (L) 10/14/2019   IRON 48 10/14/2019   TIBC 405 10/14/2019   UIBC 357 10/14/2019   IRONPCTSAT 12 (L) 10/14/2019   Lab Results  Component Value Date   RETICCTPCT 0.8 11/17/2018   RBC 3.57 (L) 10/14/2019   No results found for: KPAFRELGTCHN, LAMBDASER, KAPLAMBRATIO No results found for: IGGSERUM, IGA, IGMSERUM No results found for: Odetta Pink, SPEI   Chemistry      Component Value Date/Time   NA 141 10/14/2019 0948   K 3.8 10/14/2019 0948   CL 107 10/14/2019 0948   CO2 27 10/14/2019 0948   BUN 19 10/14/2019 0948   CREATININE 1.03 (H) 10/14/2019 0948      Component Value Date/Time   CALCIUM 8.9 10/14/2019 0948   ALKPHOS 43 10/14/2019 0948   AST 18 10/14/2019 0948   ALT 10 10/14/2019 0948   BILITOT 0.3 10/14/2019 0948       Impression and Plan: Taylor Flynn is a pleasant 44 yo African American female with metastatic breast cancer, HER-2 positive. CA 27.29 in December was 15.1.  We will proceed with Herceptin treatment today. Patient would like to continue to hold Perjeta for now.  I spoke with Dr. Marin Olp and if she agrees to radiation of the left breast at [facility] we can keep her on Herceptin/Perjeta but if she declines radiation the next step is to switch her over to [facility].  Dr. Marin Olp also plans to call and speak with the patient.  We will schedule her MD follow-up in 4 weeks  She will contact our office with any questions or concerns. We can certainly see her sooner if needed.   [name], NP 1/19/20219:38 AM

## 2019-11-23 NOTE — Patient Instructions (Signed)

## 2019-11-23 NOTE — Telephone Encounter (Signed)
Per verbal request from Dr Marin Olp OK to leave appointments as scheduled for 2/11 per 1/19 los

## 2019-11-24 LAB — CANCER ANTIGEN 27.29: CA 27.29: 17.6 U/mL (ref 0.0–38.6)

## 2019-12-16 ENCOUNTER — Inpatient Hospital Stay

## 2019-12-16 ENCOUNTER — Other Ambulatory Visit: Payer: Self-pay

## 2019-12-16 ENCOUNTER — Inpatient Hospital Stay: Attending: Family

## 2019-12-16 ENCOUNTER — Encounter: Payer: Self-pay | Admitting: Hematology & Oncology

## 2019-12-16 ENCOUNTER — Inpatient Hospital Stay (HOSPITAL_BASED_OUTPATIENT_CLINIC_OR_DEPARTMENT_OTHER): Admitting: Hematology & Oncology

## 2019-12-16 VITALS — BP 104/66 | HR 74 | Temp 97.1°F | Ht 63.5 in | Wt 116.0 lb

## 2019-12-16 DIAGNOSIS — C50919 Malignant neoplasm of unspecified site of unspecified female breast: Secondary | ICD-10-CM

## 2019-12-16 DIAGNOSIS — Z5112 Encounter for antineoplastic immunotherapy: Secondary | ICD-10-CM | POA: Insufficient documentation

## 2019-12-16 DIAGNOSIS — C787 Secondary malignant neoplasm of liver and intrahepatic bile duct: Secondary | ICD-10-CM

## 2019-12-16 DIAGNOSIS — Z171 Estrogen receptor negative status [ER-]: Secondary | ICD-10-CM

## 2019-12-16 DIAGNOSIS — C50912 Malignant neoplasm of unspecified site of left female breast: Secondary | ICD-10-CM | POA: Insufficient documentation

## 2019-12-16 DIAGNOSIS — K8689 Other specified diseases of pancreas: Secondary | ICD-10-CM

## 2019-12-16 DIAGNOSIS — C50812 Malignant neoplasm of overlapping sites of left female breast: Secondary | ICD-10-CM | POA: Diagnosis not present

## 2019-12-16 DIAGNOSIS — D500 Iron deficiency anemia secondary to blood loss (chronic): Secondary | ICD-10-CM

## 2019-12-16 LAB — CBC WITH DIFFERENTIAL (CANCER CENTER ONLY)
Abs Immature Granulocytes: 0.01 10*3/uL (ref 0.00–0.07)
Basophils Absolute: 0 10*3/uL (ref 0.0–0.1)
Basophils Relative: 1 %
Eosinophils Absolute: 0.1 10*3/uL (ref 0.0–0.5)
Eosinophils Relative: 2 %
HCT: 30 % — ABNORMAL LOW (ref 36.0–46.0)
Hemoglobin: 9.3 g/dL — ABNORMAL LOW (ref 12.0–15.0)
Immature Granulocytes: 0 %
Lymphocytes Relative: 43 %
Lymphs Abs: 1.7 10*3/uL (ref 0.7–4.0)
MCH: 25.7 pg — ABNORMAL LOW (ref 26.0–34.0)
MCHC: 31 g/dL (ref 30.0–36.0)
MCV: 82.9 fL (ref 80.0–100.0)
Monocytes Absolute: 0.3 10*3/uL (ref 0.1–1.0)
Monocytes Relative: 7 %
Neutro Abs: 1.9 10*3/uL (ref 1.7–7.7)
Neutrophils Relative %: 47 %
Platelet Count: 240 10*3/uL (ref 150–400)
RBC: 3.62 MIL/uL — ABNORMAL LOW (ref 3.87–5.11)
RDW: 16.4 % — ABNORMAL HIGH (ref 11.5–15.5)
WBC Count: 4.1 10*3/uL (ref 4.0–10.5)
nRBC: 0 % (ref 0.0–0.2)

## 2019-12-16 LAB — CMP (CANCER CENTER ONLY)
ALT: 12 U/L (ref 0–44)
AST: 19 U/L (ref 15–41)
Albumin: 4.1 g/dL (ref 3.5–5.0)
Alkaline Phosphatase: 40 U/L (ref 38–126)
Anion gap: 12 (ref 5–15)
BUN: 26 mg/dL — ABNORMAL HIGH (ref 6–20)
CO2: 25 mmol/L (ref 22–32)
Calcium: 9.6 mg/dL (ref 8.9–10.3)
Chloride: 104 mmol/L (ref 98–111)
Creatinine: 1.05 mg/dL — ABNORMAL HIGH (ref 0.44–1.00)
GFR, Est AFR Am: >60 mL/min (ref >60)
GFR, Estimated: >60 mL/min (ref >60)
Glucose, Bld: 54 mg/dL — ABNORMAL LOW (ref 70–99)
Potassium: 3.8 mmol/L (ref 3.5–5.1)
Sodium: 141 mmol/L (ref 135–145)
Total Bilirubin: 0.4 mg/dL (ref 0.3–1.2)
Total Protein: 6.5 g/dL (ref 6.5–8.1)

## 2019-12-16 LAB — LIPASE, BLOOD: Lipase: 67 U/L — ABNORMAL HIGH (ref 11–51)

## 2019-12-16 LAB — AMYLASE: Amylase: 89 U/L (ref 28–100)

## 2019-12-16 MED ORDER — SODIUM CHLORIDE 0.9 % IV SOLN
315.0000 mg | Freq: Once | INTRAVENOUS | Status: AC
  Administered 2019-12-16: 330 mg via INTRAVENOUS
  Filled 2019-12-16: qty 11

## 2019-12-16 MED ORDER — HEPARIN SOD (PORK) LOCK FLUSH 100 UNIT/ML IV SOLN
500.0000 [IU] | Freq: Once | INTRAVENOUS | Status: AC | PRN
  Administered 2019-12-16: 500 [IU]
  Filled 2019-12-16: qty 5

## 2019-12-16 MED ORDER — TRASTUZUMAB CHEMO 150 MG IV SOLR
4.5000 mg/kg | Freq: Once | INTRAVENOUS | Status: AC
  Administered 2019-12-16: 231 mg via INTRAVENOUS
  Filled 2019-12-16: qty 11

## 2019-12-16 MED ORDER — SODIUM CHLORIDE 0.9% FLUSH
10.0000 mL | INTRAVENOUS | Status: DC | PRN
  Administered 2019-12-16: 10 mL
  Filled 2019-12-16: qty 10

## 2019-12-16 MED ORDER — SODIUM CHLORIDE 0.9 % IV SOLN
Freq: Once | INTRAVENOUS | Status: AC
  Filled 2019-12-16: qty 250

## 2019-12-16 NOTE — Patient Instructions (Signed)

## 2019-12-16 NOTE — Patient Instructions (Signed)
Pertuzumab injection What is this medicine? PERTUZUMAB (per TOOZ ue mab) is a monoclonal antibody. It is used to treat breast cancer. This medicine may be used for other purposes; ask your health care provider or pharmacist if you have questions. COMMON BRAND NAME(S): PERJETA What should I tell my health care provider before I take this medicine? They need to know if you have any of these conditions:  heart disease  heart failure  high blood pressure  history of irregular heart beat  recent or ongoing radiation therapy  an unusual or allergic reaction to pertuzumab, other medicines, foods, dyes, or preservatives  pregnant or trying to get pregnant  breast-feeding How should I use this medicine? This medicine is for infusion into a vein. It is given by a health care professional in a hospital or clinic setting. Talk to your pediatrician regarding the use of this medicine in children. Special care may be needed. Overdosage: If you think you have taken too much of this medicine contact a poison control center or emergency room at once. NOTE: This medicine is only for you. Do not share this medicine with others. What if I miss a dose? It is important not to miss your dose. Call your doctor or health care professional if you are unable to keep an appointment. What may interact with this medicine? Interactions are not expected. Give your health care provider a list of all the medicines, herbs, non-prescription drugs, or dietary supplements you use. Also tell them if you smoke, drink alcohol, or use illegal drugs. Some items may interact with your medicine. This list may not describe all possible interactions. Give your health care provider a list of all the medicines, herbs, non-prescription drugs, or dietary supplements you use. Also tell them if you smoke, drink alcohol, or use illegal drugs. Some items may interact with your medicine. What should I watch for while using this  medicine? Your condition will be monitored carefully while you are receiving this medicine. Report any side effects. Continue your course of treatment even though you feel ill unless your doctor tells you to stop. Do not become pregnant while taking this medicine or for 7 months after stopping it. Women should inform their doctor if they wish to become pregnant or think they might be pregnant. Women of child-bearing potential will need to have a negative pregnancy test before starting this medicine. There is a potential for serious side effects to an unborn child. Talk to your health care professional or pharmacist for more information. Do not breast-feed an infant while taking this medicine or for 7 months after stopping it. Women must use effective birth control with this medicine. Call your doctor or health care professional for advice if you get a fever, chills or sore throat, or other symptoms of a cold or flu. Do not treat yourself. Try to avoid being around people who are sick. You may experience fever, chills, and headache during the infusion. Report any side effects during the infusion to your health care professional. What side effects may I notice from receiving this medicine? Side effects that you should report to your doctor or health care professional as soon as possible:  breathing problems  chest pain or palpitations  dizziness  feeling faint or lightheaded  fever or chills  skin rash, itching or hives  sore throat  swelling of the face, lips, or tongue  swelling of the legs or ankles  unusually weak or tired Side effects that usually do not require  medical attention (report to your doctor or health care professional if they continue or are bothersome):  diarrhea  hair loss  nausea, vomiting  tiredness This list may not describe all possible side effects. Call your doctor for medical advice about side effects. You may report side effects to FDA at  1-800-FDA-1088. Where should I keep my medicine? This drug is given in a hospital or clinic and will not be stored at home. NOTE: This sheet is a summary. It may not cover all possible information. If you have questions about this medicine, talk to your doctor, pharmacist, or health care provider.  2020 Elsevier/Gold Standard (2015-11-23 12:08:50) Trastuzumab injection for infusion What is this medicine? TRASTUZUMAB (tras TOO zoo mab) is a monoclonal antibody. It is used to treat breast cancer and stomach cancer. This medicine may be used for other purposes; ask your health care provider or pharmacist if you have questions. COMMON BRAND NAME(S): Herceptin, Herzuma, KANJINTI, Ogivri, Ontruzant, Trazimera What should I tell my health care provider before I take this medicine? They need to know if you have any of these conditions:  heart disease  heart failure  lung or breathing disease, like asthma  an unusual or allergic reaction to trastuzumab, benzyl alcohol, or other medications, foods, dyes, or preservatives  pregnant or trying to get pregnant  breast-feeding How should I use this medicine? This drug is given as an infusion into a vein. It is administered in a hospital or clinic by a specially trained health care professional. Talk to your pediatrician regarding the use of this medicine in children. This medicine is not approved for use in children. Overdosage: If you think you have taken too much of this medicine contact a poison control center or emergency room at once. NOTE: This medicine is only for you. Do not share this medicine with others. What if I miss a dose? It is important not to miss a dose. Call your doctor or health care professional if you are unable to keep an appointment. What may interact with this medicine? This medicine may interact with the following medications:  certain types of chemotherapy, such as daunorubicin, doxorubicin, epirubicin, and  idarubicin This list may not describe all possible interactions. Give your health care provider a list of all the medicines, herbs, non-prescription drugs, or dietary supplements you use. Also tell them if you smoke, drink alcohol, or use illegal drugs. Some items may interact with your medicine. What should I watch for while using this medicine? Visit your doctor for checks on your progress. Report any side effects. Continue your course of treatment even though you feel ill unless your doctor tells you to stop. Call your doctor or health care professional for advice if you get a fever, chills or sore throat, or other symptoms of a cold or flu. Do not treat yourself. Try to avoid being around people who are sick. You may experience fever, chills and shaking during your first infusion. These effects are usually mild and can be treated with other medicines. Report any side effects during the infusion to your health care professional. Fever and chills usually do not happen with later infusions. Do not become pregnant while taking this medicine or for 7 months after stopping it. Women should inform their doctor if they wish to become pregnant or think they might be pregnant. Women of child-bearing potential will need to have a negative pregnancy test before starting this medicine. There is a potential for serious side effects to an unborn   child. Talk to your health care professional or pharmacist for more information. Do not breast-feed an infant while taking this medicine or for 7 months after stopping it. Women must use effective birth control with this medicine. What side effects may I notice from receiving this medicine? Side effects that you should report to your doctor or health care professional as soon as possible:  allergic reactions like skin rash, itching or hives, swelling of the face, lips, or tongue  chest pain or palpitations  cough  dizziness  feeling faint or lightheaded,  falls  fever  general ill feeling or flu-like symptoms  signs of worsening heart failure like breathing problems; swelling in your legs and feet  unusually weak or tired Side effects that usually do not require medical attention (report to your doctor or health care professional if they continue or are bothersome):  bone pain  changes in taste  diarrhea  joint pain  nausea/vomiting  weight loss This list may not describe all possible side effects. Call your doctor for medical advice about side effects. You may report side effects to FDA at 1-800-FDA-1088. Where should I keep my medicine? This drug is given in a hospital or clinic and will not be stored at home. NOTE: This sheet is a summary. It may not cover all possible information. If you have questions about this medicine, talk to your doctor, pharmacist, or health care provider.  2020 Elsevier/Gold Standard (2016-10-15 14:37:52)  

## 2019-12-16 NOTE — Progress Notes (Signed)
Hematology and Oncology Follow Up Visit  Taylor Flynn [id number] [birth year] 44 y.o. 12/16/2019   Principle Diagnosis:  Metastatic breast cancer-ER-/HER-2+  Current Therapy:   Herceptin/Perjeta- s/p cycle15(previous cycles given in [place]) Zometa 4 mg IV q 2 monthsdue again inMarch 2021   Interim History:  Taylor Flynn is here today for follow-up and treatment.  Of note, she actually had a lobectomy on the left breast.  This was done at [facility] on 11/11/2019.  The pathology report ([id number]) showed an invasive ductal carcinoma.  It was high-grade.  It was 0.8 cm.  There was a focus of DCIS at one of the margins.  The tumor was ER negative/PR negative/HER-2 positive.  I talked to Dr. Amalia Hailey about this.  I told her that something like this I would clearly favor radiation therapy for her.  I just do not think that there is adequate blood supply to the area that she had her tumor that would allow treatment to get into that area in a high enough concentration.  She is already spoken to radiation oncology.  She said they told her the exact same issue.  Hopefully, she will agree to take radiation therapy.  We try to get a PET scan on her.  This has not been easy.  Hopefully, we can get her insurance company to do this.  I think it is essential that we get a PET scan to see if she has disease elsewhere.  Her CA 27.29 really is not a good marker for her.  Her last CA 27-29 in January was 17.6.  She otherwise is doing okay.  Unfortunate, her father passed away a few weeks ago.  He ultimately had Alzheimer's and passed away from this.  She is still in touch with her oncologic nutritionist.  She has a very good relationship with her.  She is provided Dr. Amalia Hailey with a lot of good information.  There is been no change in bowel or bladder habits.  She has had no bleeding.  She has had no rashes.  She has had no headache.  Overall, I would have to say that her performance status is ECOG  1.  Medications:  Allergies as of 12/16/2019      Reactions   Aspirin Swelling   Angioedema    Sulfa Antibiotics Rash      Medication List       Accurate as of December 16, 2019  9:28 AM. If you have any questions, ask your nurse or doctor.        STOP taking these medications   ivermectin 3 MG Tabs tablet Commonly known as: STROMECTOL Stopped by: [name], MD     TAKE these medications   EPINEPHrine 0.3 mg/0.3 mL Soaj injection Commonly known as: EPI-PEN Inject 0.3 mg into the muscle as needed for anaphylaxis.   lidocaine-prilocaine cream Commonly known as: EMLA Apply 1 application topically as needed.   MELATONIN PO Take 40 mg by mouth every evening.   NALTREXONE HCL PO Take 4.5 mg by mouth See admin instructions. Take 4.5 mg by mouth at night Wen through Sat   NON FORMULARY 2 (two) times daily. Pancreatic enzymes 3 caps AC and 2 caps with meals.   PRESCRIPTION MEDICATION Inject 1 mL into the skin every Monday, Wednesday, and Friday.   VITAMIN K2 PO Take 15 drops by mouth 3 (three) times daily.       Allergies:  Allergies  Allergen Reactions  . Aspirin Swelling  Angioedema   . Sulfa Antibiotics Rash    Past Medical History, Surgical history, Social history, and Family History were reviewed and updated.  Review of Systems: Review of Systems  Constitutional: Negative.   HENT: Negative.   Eyes: Negative.   Respiratory: Negative.   Cardiovascular: Negative.   Gastrointestinal: Negative.   Genitourinary: Negative.   Musculoskeletal: Negative.   Skin: Negative.   Neurological: Negative.   Endo/Heme/Allergies: Negative.   Psychiatric/Behavioral: Negative.      Physical Exam:  height is _0  (1.6 m) and weight is 116 lb 0.6 oz (52.6 kg). Her temporal temperature is 97.1 F (36.2 C) (abnormal). Her blood pressure is 104/66 and her pulse is 74. Her oxygen saturation is 100%.   Wt Readings from Last 3 Encounters:  12/16/19 116 lb 0.6  oz (52.6 kg)  11/23/19 114 lb (51.7 kg)  10/14/19 116 lb (52.6 kg)    Physical Exam Vitals reviewed.  Constitutional:      Comments: Her chest wall exam shows the left lumpectomy.  She bases had a mastectomy there now.  She has changes from surgery.  She has the healing lumpectomy scar.  There is no obvious discharge.  There is no bleeding.  There is no exudate or erythema.  There is no left axillary adenopathy.  HENT:     Head: Normocephalic and atraumatic.  Eyes:     Pupils: Pupils are equal, round, and reactive to light.  Cardiovascular:     Rate and Rhythm: Normal rate and regular rhythm.     Heart sounds: Normal heart sounds.  Pulmonary:     Effort: Pulmonary effort is normal.     Breath sounds: Normal breath sounds.  Abdominal:     General: Bowel sounds are normal.     Palpations: Abdomen is soft.  Musculoskeletal:        General: No tenderness or deformity. Normal range of motion.     Cervical back: Normal range of motion.  Lymphadenopathy:     Cervical: No cervical adenopathy.  Skin:    General: Skin is warm and dry.     Findings: No erythema or rash.  Neurological:     Mental Status: She is alert and oriented to person, place, and time.  Psychiatric:        Behavior: Behavior normal.        Thought Content: Thought content normal.        Judgment: Judgment normal.      Lab Results  Component Value Date   WBC 4.1 12/16/2019   HGB 9.3 (L) 12/16/2019   HCT 30.0 (L) 12/16/2019   MCV 82.9 12/16/2019   PLT 240 12/16/2019   Lab Results  Component Value Date   FERRITIN 8 (L) 11/23/2019   IRON 45 11/23/2019   TIBC 429 11/23/2019   UIBC 384 11/23/2019   IRONPCTSAT 10 (L) 11/23/2019   Lab Results  Component Value Date   RETICCTPCT 0.8 11/17/2018   RBC 3.62 (L) 12/16/2019   No results found for: KPAFRELGTCHN, LAMBDASER, KAPLAMBRATIO No results found for: IGGSERUM, IGA, IGMSERUM No results found for: TOTALPROTELP, ALBUMINELP, A1GS, A2GS, BETS, BETA2SER,  GAMS, MSPIKE, SPEI   Chemistry      Component Value Date/Time   NA 138 11/23/2019 0930   K 4.1 11/23/2019 0930   CL 103 11/23/2019 0930   CO2 23 11/23/2019 0930   BUN 25 (H) 11/23/2019 0930   CREATININE 0.98 11/23/2019 0930      Component Value Date/Time  CALCIUM 9.4 11/23/2019 0930   ALKPHOS 45 11/23/2019 0930   AST 17 11/23/2019 0930   ALT 9 11/23/2019 0930   BILITOT 0.4 11/23/2019 0930       Impression and Plan:   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  She has been on anti-HER-2 therapy.  She has been doing well with this.  However, she developed a lesion in the left breast area.  This certainly appears to be metastatic disease.  Again, her CA 27.29 is not a good indicator for [place] with respect to metastatic disease.  I really hope that she does get radiation therapy.  I really think this will be beneficial to help with local recurrence issues.  I think radiation therapy would be very helpful to help minimize local recurrence.  She will restart the Perjeta along with the Herceptin.  I guess there was an issue with respect to pancreatitis.  I think her numbers were up a little bit although not sure if she had any obvious symptoms.  She is still doing her holistic approach.  Hopefully, we can "merge" the traditional therapy with her holistic therapy to give her a good outcome and a excellent quality of life.  We will see about getting the PET scan on her.  We also need to get an echocardiogram on her.  I think her last one was back in May 2020.  I will plan to see her back in another 3-4 weeks.  I spent about 40 minutes with her today.  Things were pretty complicated given this recent surgery that she had.  I want to make sure we talked about the treatment recommendations which would best suit her and would give her the best outcome.    [name], MD 2/11/20219:28 AM

## 2019-12-17 ENCOUNTER — Other Ambulatory Visit: Payer: Self-pay | Admitting: Family

## 2019-12-17 DIAGNOSIS — C50919 Malignant neoplasm of unspecified site of unspecified female breast: Secondary | ICD-10-CM

## 2019-12-17 DIAGNOSIS — C50912 Malignant neoplasm of unspecified site of left female breast: Secondary | ICD-10-CM

## 2019-12-17 DIAGNOSIS — C787 Secondary malignant neoplasm of liver and intrahepatic bile duct: Secondary | ICD-10-CM

## 2019-12-17 LAB — IRON AND TIBC
Iron: 32 ug/dL — ABNORMAL LOW (ref 41–142)
Saturation Ratios: 8 % — ABNORMAL LOW (ref 21–57)
TIBC: 391 ug/dL (ref 236–444)
UIBC: 359 ug/dL (ref 120–384)

## 2019-12-17 LAB — CANCER ANTIGEN 27.29: CA 27.29: 13.3 U/mL (ref 0.0–38.6)

## 2019-12-17 LAB — FERRITIN: Ferritin: 6 ng/mL — ABNORMAL LOW (ref 11–307)

## 2019-12-20 ENCOUNTER — Telehealth: Payer: Self-pay | Admitting: Hematology & Oncology

## 2019-12-20 NOTE — Telephone Encounter (Signed)
Appointment scheduled calendar     Mailed per 2/12 los

## 2019-12-23 ENCOUNTER — Other Ambulatory Visit (HOSPITAL_COMMUNITY)

## 2019-12-30 ENCOUNTER — Other Ambulatory Visit: Payer: Self-pay

## 2019-12-30 ENCOUNTER — Encounter (HOSPITAL_COMMUNITY)
Admission: RE | Admit: 2019-12-30 | Discharge: 2019-12-30 | Disposition: A | Discharge status: Home / Self Care | Source: Ambulatory Visit | Attending: Hematology & Oncology | Admitting: Hematology & Oncology

## 2019-12-30 DIAGNOSIS — Z171 Estrogen receptor negative status [ER-]: Secondary | ICD-10-CM | POA: Insufficient documentation

## 2019-12-30 DIAGNOSIS — C50812 Malignant neoplasm of overlapping sites of left female breast: Secondary | ICD-10-CM | POA: Insufficient documentation

## 2019-12-30 LAB — GLUCOSE, CAPILLARY: Glucose-Capillary: 66 mg/dL — ABNORMAL LOW (ref 70–99)

## 2019-12-30 MED ORDER — FLUDEOXYGLUCOSE F - 18 (FDG) INJECTION: 6.3000 | Freq: Once | INTRAVENOUS | Status: DC | PRN

## 2020-01-03 ENCOUNTER — Encounter: Payer: Self-pay | Admitting: *Deleted

## 2020-01-06 ENCOUNTER — Inpatient Hospital Stay

## 2020-01-06 ENCOUNTER — Inpatient Hospital Stay: Admitting: Family

## 2020-01-13 ENCOUNTER — Other Ambulatory Visit

## 2020-01-13 ENCOUNTER — Inpatient Hospital Stay (HOSPITAL_BASED_OUTPATIENT_CLINIC_OR_DEPARTMENT_OTHER): Admitting: Family

## 2020-01-13 ENCOUNTER — Inpatient Hospital Stay

## 2020-01-13 ENCOUNTER — Ambulatory Visit

## 2020-01-13 ENCOUNTER — Other Ambulatory Visit: Payer: Self-pay

## 2020-01-13 ENCOUNTER — Encounter: Payer: Self-pay | Admitting: Family

## 2020-01-13 ENCOUNTER — Ambulatory Visit: Admitting: Family

## 2020-01-13 ENCOUNTER — Inpatient Hospital Stay: Attending: Family

## 2020-01-13 VITALS — BP 109/60 | HR 72 | Temp 97.5°F | Resp 18 | Ht 63.5 in | Wt 118.0 lb

## 2020-01-13 DIAGNOSIS — Z171 Estrogen receptor negative status [ER-]: Secondary | ICD-10-CM

## 2020-01-13 DIAGNOSIS — Z170 Estrogen receptor positive status [ER+]: Secondary | ICD-10-CM | POA: Diagnosis not present

## 2020-01-13 DIAGNOSIS — Z5112 Encounter for antineoplastic immunotherapy: Secondary | ICD-10-CM | POA: Diagnosis not present

## 2020-01-13 DIAGNOSIS — C50912 Malignant neoplasm of unspecified site of left female breast: Secondary | ICD-10-CM

## 2020-01-13 DIAGNOSIS — C50812 Malignant neoplasm of overlapping sites of left female breast: Secondary | ICD-10-CM

## 2020-01-13 DIAGNOSIS — C787 Secondary malignant neoplasm of liver and intrahepatic bile duct: Secondary | ICD-10-CM

## 2020-01-13 DIAGNOSIS — C50919 Malignant neoplasm of unspecified site of unspecified female breast: Secondary | ICD-10-CM

## 2020-01-13 DIAGNOSIS — C792 Secondary malignant neoplasm of skin: Secondary | ICD-10-CM

## 2020-01-13 LAB — CBC WITH DIFFERENTIAL (CANCER CENTER ONLY)
Abs Immature Granulocytes: 0.01 10*3/uL (ref 0.00–0.07)
Basophils Absolute: 0 10*3/uL (ref 0.0–0.1)
Basophils Relative: 1 %
Eosinophils Absolute: 0.1 10*3/uL (ref 0.0–0.5)
Eosinophils Relative: 3 %
HCT: 28.8 % — ABNORMAL LOW (ref 36.0–46.0)
Hemoglobin: 8.7 g/dL — ABNORMAL LOW (ref 12.0–15.0)
Immature Granulocytes: 0 %
Lymphocytes Relative: 34 %
Lymphs Abs: 1.1 10*3/uL (ref 0.7–4.0)
MCH: 25.7 pg — ABNORMAL LOW (ref 26.0–34.0)
MCHC: 30.2 g/dL (ref 30.0–36.0)
MCV: 85.2 fL (ref 80.0–100.0)
Monocytes Absolute: 0.3 10*3/uL (ref 0.1–1.0)
Monocytes Relative: 8 %
Neutro Abs: 1.8 10*3/uL (ref 1.7–7.7)
Neutrophils Relative %: 54 %
Platelet Count: 237 10*3/uL (ref 150–400)
RBC: 3.38 MIL/uL — ABNORMAL LOW (ref 3.87–5.11)
RDW: 17.1 % — ABNORMAL HIGH (ref 11.5–15.5)
WBC Count: 3.3 10*3/uL — ABNORMAL LOW (ref 4.0–10.5)
nRBC: 0 % (ref 0.0–0.2)

## 2020-01-13 LAB — CMP (CANCER CENTER ONLY)
ALT: 8 U/L (ref 0–44)
AST: 17 U/L (ref 15–41)
Albumin: 4.1 g/dL (ref 3.5–5.0)
Alkaline Phosphatase: 38 U/L (ref 38–126)
Anion gap: 8 (ref 5–15)
BUN: 26 mg/dL — ABNORMAL HIGH (ref 6–20)
CO2: 25 mmol/L (ref 22–32)
Calcium: 9.1 mg/dL (ref 8.9–10.3)
Chloride: 107 mmol/L (ref 98–111)
Creatinine: 0.91 mg/dL (ref 0.44–1.00)
GFR, Est AFR Am: >60 mL/min (ref >60)
GFR, Estimated: >60 mL/min (ref >60)
Glucose, Bld: 69 mg/dL — ABNORMAL LOW (ref 70–99)
Potassium: 3.7 mmol/L (ref 3.5–5.1)
Sodium: 140 mmol/L (ref 135–145)
Total Bilirubin: 0.4 mg/dL (ref 0.3–1.2)
Total Protein: 6.4 g/dL — ABNORMAL LOW (ref 6.5–8.1)

## 2020-01-13 LAB — C-REACTIVE PROTEIN: CRP: 0.6 mg/dL (ref <1.0)

## 2020-01-13 LAB — SEDIMENTATION RATE: Sed Rate: 12 mm/hr (ref 0–22)

## 2020-01-13 LAB — LACTATE DEHYDROGENASE: LDH: 187 U/L (ref 98–192)

## 2020-01-13 MED ORDER — ZOLEDRONIC ACID 4 MG/100ML IV SOLN
4.0000 mg | Freq: Once | INTRAVENOUS | Status: AC
  Administered 2020-01-13: 4 mg via INTRAVENOUS
  Filled 2020-01-13: qty 100

## 2020-01-13 MED ORDER — SODIUM CHLORIDE 0.9% FLUSH
10.0000 mL | INTRAVENOUS | Status: DC | PRN
  Administered 2020-01-13: 10 mL
  Filled 2020-01-13: qty 10

## 2020-01-13 MED ORDER — SODIUM CHLORIDE 0.9 % IV SOLN
210.0000 mg | Freq: Once | INTRAVENOUS | Status: AC
  Administered 2020-01-13: 210 mg via INTRAVENOUS
  Filled 2020-01-13: qty 7

## 2020-01-13 MED ORDER — SODIUM CHLORIDE 0.9 % IV SOLN
Freq: Once | INTRAVENOUS | Status: AC
  Filled 2020-01-13: qty 250

## 2020-01-13 MED ORDER — SODIUM CHLORIDE 0.9 % IV SOLN
Freq: Once | INTRAVENOUS | Status: DC
  Filled 2020-01-13: qty 250

## 2020-01-13 MED ORDER — HEPARIN SOD (PORK) LOCK FLUSH 100 UNIT/ML IV SOLN
500.0000 [IU] | Freq: Once | INTRAVENOUS | Status: AC | PRN
  Administered 2020-01-13: 500 [IU]
  Filled 2020-01-13: qty 5

## 2020-01-13 NOTE — Progress Notes (Signed)
Decrease Perjeta dose to 210 mg (50% of original dose) today at patient's request per Dr. Marin Olp. 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  Patient does not wish to receive Herceptin today.  Dose decreased per his instructions.

## 2020-01-13 NOTE — Patient Instructions (Signed)
Pertuzumab injection What is this medicine? PERTUZUMAB (per TOOZ ue mab) is a monoclonal antibody. It is used to treat breast cancer. This medicine may be used for other purposes; ask your health care provider or pharmacist if you have questions. COMMON BRAND NAME(S): PERJETA What should I tell my health care provider before I take this medicine? They need to know if you have any of these conditions:  heart disease  heart failure  high blood pressure  history of irregular heart beat  recent or ongoing radiation therapy  an unusual or allergic reaction to pertuzumab, other medicines, foods, dyes, or preservatives  pregnant or trying to get pregnant  breast-feeding How should I use this medicine? This medicine is for infusion into a vein. It is given by a health care professional in a hospital or clinic setting. Talk to your pediatrician regarding the use of this medicine in children. Special care may be needed. Overdosage: If you think you have taken too much of this medicine contact a poison control center or emergency room at once. NOTE: This medicine is only for you. Do not share this medicine with others. What if I miss a dose? It is important not to miss your dose. Call your doctor or health care professional if you are unable to keep an appointment. What may interact with this medicine? Interactions are not expected. Give your health care provider a list of all the medicines, herbs, non-prescription drugs, or dietary supplements you use. Also tell them if you smoke, drink alcohol, or use illegal drugs. Some items may interact with your medicine. This list may not describe all possible interactions. Give your health care provider a list of all the medicines, herbs, non-prescription drugs, or dietary supplements you use. Also tell them if you smoke, drink alcohol, or use illegal drugs. Some items may interact with your medicine. What should I watch for while using this  medicine? Your condition will be monitored carefully while you are receiving this medicine. Report any side effects. Continue your course of treatment even though you feel ill unless your doctor tells you to stop. Do not become pregnant while taking this medicine or for 7 months after stopping it. Women should inform their doctor if they wish to become pregnant or think they might be pregnant. Women of child-bearing potential will need to have a negative pregnancy test before starting this medicine. There is a potential for serious side effects to an unborn child. Talk to your health care professional or pharmacist for more information. Do not breast-feed an infant while taking this medicine or for 7 months after stopping it. Women must use effective birth control with this medicine. Call your doctor or health care professional for advice if you get a fever, chills or sore throat, or other symptoms of a cold or flu. Do not treat yourself. Try to avoid being around people who are sick. You may experience fever, chills, and headache during the infusion. Report any side effects during the infusion to your health care professional. What side effects may I notice from receiving this medicine? Side effects that you should report to your doctor or health care professional as soon as possible:  breathing problems  chest pain or palpitations  dizziness  feeling faint or lightheaded  fever or chills  skin rash, itching or hives  sore throat  swelling of the face, lips, or tongue  swelling of the legs or ankles  unusually weak or tired Side effects that usually do not require  medical attention (report to your doctor or health care professional if they continue or are bothersome):  diarrhea  hair loss  nausea, vomiting  tiredness This list may not describe all possible side effects. Call your doctor for medical advice about side effects. You may report side effects to FDA at  1-800-FDA-1088. Where should I keep my medicine? This drug is given in a hospital or clinic and will not be stored at home. NOTE: This sheet is a summary. It may not cover all possible information. If you have questions about this medicine, talk to your doctor, pharmacist, or health care provider.  2020 Elsevier/Gold Standard (2015-11-23 12:08:50) Zoledronic Acid injection (Hypercalcemia, Oncology) What is this medicine? ZOLEDRONIC ACID (ZOE le dron ik AS id) lowers the amount of calcium loss from bone. It is used to treat too much calcium in your blood from cancer. It is also used to prevent complications of cancer that has spread to the bone. This medicine may be used for other purposes; ask your health care provider or pharmacist if you have questions. COMMON BRAND NAME(S): Zometa What should I tell my health care provider before I take this medicine? They need to know if you have any of these conditions:  aspirin-sensitive asthma  cancer, especially if you are receiving medicines used to treat cancer  dental disease or wear dentures  infection  kidney disease  receiving corticosteroids like dexamethasone or prednisone  an unusual or allergic reaction to zoledronic acid, other medicines, foods, dyes, or preservatives  pregnant or trying to get pregnant  breast-feeding How should I use this medicine? This medicine is for infusion into a vein. It is given by a health care professional in a hospital or clinic setting. Talk to your pediatrician regarding the use of this medicine in children. Special care may be needed. Overdosage: If you think you have taken too much of this medicine contact a poison control center or emergency room at once. NOTE: This medicine is only for you. Do not share this medicine with others. What if I miss a dose? It is important not to miss your dose. Call your doctor or health care professional if you are unable to keep an appointment. What may  interact with this medicine?  certain antibiotics given by injection  NSAIDs, medicines for pain and inflammation, like ibuprofen or naproxen  some diuretics like bumetanide, furosemide  teriparatide  thalidomide This list may not describe all possible interactions. Give your health care provider a list of all the medicines, herbs, non-prescription drugs, or dietary supplements you use. Also tell them if you smoke, drink alcohol, or use illegal drugs. Some items may interact with your medicine. What should I watch for while using this medicine? Visit your doctor or health care professional for regular checkups. It may be some time before you see the benefit from this medicine. Do not stop taking your medicine unless your doctor tells you to. Your doctor may order blood tests or other tests to see how you are doing. Women should inform their doctor if they wish to become pregnant or think they might be pregnant. There is a potential for serious side effects to an unborn child. Talk to your health care professional or pharmacist for more information. You should make sure that you get enough calcium and vitamin D while you are taking this medicine. Discuss the foods you eat and the vitamins you take with your health care professional. Some people who take this medicine have severe bone, joint,  and/or muscle pain. This medicine may also increase your risk for jaw problems or a broken thigh bone. Tell your doctor right away if you have severe pain in your jaw, bones, joints, or muscles. Tell your doctor if you have any pain that does not go away or that gets worse. Tell your dentist and dental surgeon that you are taking this medicine. You should not have major dental surgery while on this medicine. See your dentist to have a dental exam and fix any dental problems before starting this medicine. Take good care of your teeth while on this medicine. Make sure you see your dentist for regular follow-up  appointments. What side effects may I notice from receiving this medicine? Side effects that you should report to your doctor or health care professional as soon as possible:  allergic reactions like skin rash, itching or hives, swelling of the face, lips, or tongue  anxiety, confusion, or depression  breathing problems  changes in vision  eye pain  feeling faint or lightheaded, falls  jaw pain, especially after dental work  mouth sores  muscle cramps, stiffness, or weakness  redness, blistering, peeling or loosening of the skin, including inside the mouth  trouble passing urine or change in the amount of urine Side effects that usually do not require medical attention (report to your doctor or health care professional if they continue or are bothersome):  bone, joint, or muscle pain  constipation  diarrhea  fever  hair loss  irritation at site where injected  loss of appetite  nausea, vomiting  stomach upset  trouble sleeping  trouble swallowing  weak or tired This list may not describe all possible side effects. Call your doctor for medical advice about side effects. You may report side effects to FDA at 1-800-FDA-1088. Where should I keep my medicine? This drug is given in a hospital or clinic and will not be stored at home. NOTE: This sheet is a summary. It may not cover all possible information. If you have questions about this medicine, talk to your doctor, pharmacist, or health care provider.  2020 Elsevier/Gold Standard (2014-03-19 14:19:39)

## 2020-01-13 NOTE — Progress Notes (Signed)
Hematology and Oncology Follow Up Visit  Taylor Flynn [id number] 22-May-1976 44 y.o. 01/13/2020   Principle Diagnosis:  Metastatic breast cancer-ER-/HER-2+  Current Therapy:   Herceptin/Perjeta- s/p cycle15(previous cycles given in [place]) Zometa 4 mg IV q 2 monthsdue again inMarch 2021   Interim History:  Taylor Flynn is here today for follow-up and treatment. She notes the occasional twinge of discomfort in her right upper quadrant and amylase was 89 in February. She would like to hold Herceptin today and dose reduce Perjeta by 50%. I spoke with Dr. Marin Flynn and he is ok with these changes.  She has been researching radiation therapy in conjunction with hyperthermia and ozone injections. This is offered in both [place] and [place]. She is working with her insurance to see when and where she can go. She will let us know once the details are finalized.  She also plans to resume vitamin C infusions.  She is scheduled to have her ECHO next week on 3/15.  CA 27.29 was 13.3 last month.  She has had no fever, chills, n/v, cough, rash, dizziness, SOB, palpitations, abdominal pain or changes in bowel or bladder habits.  No episodes of bleeding. No bruising or petechiae.  No swelling, tenderness, numbness or tingling in her extremities at this time.  No falls or syncopal episodes to report.  She has a good appetite and fasts prior to treatment. She does feel that she is hydrating well and her weight is up 2 lbs since her last visit.   ECOG Performance Status: 1 - Symptomatic but completely ambulatory  Medications:  Allergies as of 01/13/2020      Reactions   Aspirin Swelling   Angioedema    Sulfa Antibiotics Rash      Medication List       Accurate as of January 13, 2020  9:28 AM. If you have any questions, ask your nurse or doctor.        EPINEPHrine 0.3 mg/0.3 mL Soaj injection Commonly known as: EPI-PEN Inject 0.3 mg into the muscle as needed for anaphylaxis.    lidocaine-prilocaine cream Commonly known as: EMLA Apply 1 application topically as needed.   MELATONIN PO Take 40 mg by mouth every evening.   NALTREXONE HCL PO Take 4.5 mg by mouth See admin instructions. Take 4.5 mg by mouth at night Wen through Sat   NON FORMULARY 2 (two) times daily. Pancreatic enzymes 3 caps AC and 2 caps with meals.   PRESCRIPTION MEDICATION Inject 1 mL into the skin every Monday, Wednesday, and Friday.   VITAMIN K2 PO Take 15 drops by mouth 3 (three) times daily.       Allergies:  Allergies  Allergen Reactions  . Aspirin Swelling    Angioedema   . Sulfa Antibiotics Rash    Past Medical History, Surgical history, Social history, and Family History were reviewed and updated.  Review of Systems: All other 10 point review of systems is negative.   Physical Exam:  vitals were not taken for this visit.   Wt Readings from Last 3 Encounters:  01/13/20 118 lb (53.5 kg)  12/16/19 116 lb 0.6 oz (52.6 kg)  11/23/19 114 lb (51.7 kg)    Ocular: Sclerae unicteric, pupils equal, round and reactive to light Ear-nose-throat: Oropharynx clear, dentition fair Lymphatic: No cervical, supraclavicular or axillary adenopathy Lungs no rales or rhonchi, good excursion bilaterally Heart regular rate and rhythm, no murmur appreciated Abd soft, nontender, positive bowel sounds, no liver or spleen tip palpated on exam,no  fluid wave  MSK no focal spinal tenderness, no joint edema Neuro: non-focal, well-oriented, appropriate affect Breasts: lumpectomy site at the 12 o'clock position of the left nipple has healed nicely. Bilateral breast exam negative for changes. No new mass, no rash or lesion noted.   Lab Results  Component Value Date   WBC 3.3 (L) 01/13/2020   HGB 8.7 (L) 01/13/2020   HCT 28.8 (L) 01/13/2020   MCV 85.2 01/13/2020   PLT 237 01/13/2020   Lab Results  Component Value Date   FERRITIN 6 (L) 12/16/2019   IRON 32 (L) 12/16/2019   TIBC 391  12/16/2019   UIBC 359 12/16/2019   IRONPCTSAT 8 (L) 12/16/2019   Lab Results  Component Value Date   RETICCTPCT 0.8 11/17/2018   RBC 3.38 (L) 01/13/2020   No results found for: KPAFRELGTCHN, LAMBDASER, KAPLAMBRATIO No results found for: IGGSERUM, IGA, IGMSERUM No results found for: Ronnald Ramp, A1GS, A2GS, Tillman Sers, SPEI   Chemistry      Component Value Date/Time   NA 141 12/16/2019 0903   K 3.8 12/16/2019 0903   CL 104 12/16/2019 0903   CO2 25 12/16/2019 0903   BUN 26 (H) 12/16/2019 0903   CREATININE 1.05 (H) 12/16/2019 0903      Component Value Date/Time   CALCIUM 9.6 12/16/2019 0903   ALKPHOS 40 12/16/2019 0903   AST 19 12/16/2019 0903   ALT 12 12/16/2019 0903   BILITOT 0.4 12/16/2019 0903       Impression and Plan: Taylor Flynn is a very pleasant 44 yo African American female with metastatic breast cancer. We will proceed with reduced dose of Perjeta today. No Herceptin.  She will also received Zometa.  She will let us know once she has scheduled her radiation therapy along with the hyperthermia and ozone injection therapy.  Sed rate, CRP and LDH added to lab work today.  We will see her again in 4 weeks.  She will contact our office with any questions or concerns. We can certainly see her sooner if needed.   Taylor Peace, NP 3/11/20219:28 AM

## 2020-01-14 LAB — CANCER ANTIGEN 27.29: CA 27.29: 16.9 U/mL (ref 0.0–38.6)

## 2020-01-17 ENCOUNTER — Ambulatory Visit (HOSPITAL_COMMUNITY): Attending: Internal Medicine

## 2020-01-17 ENCOUNTER — Other Ambulatory Visit: Payer: Self-pay

## 2020-01-17 DIAGNOSIS — Z171 Estrogen receptor negative status [ER-]: Secondary | ICD-10-CM | POA: Insufficient documentation

## 2020-01-17 DIAGNOSIS — C50812 Malignant neoplasm of overlapping sites of left female breast: Secondary | ICD-10-CM | POA: Diagnosis not present

## 2020-01-19 ENCOUNTER — Telehealth: Payer: Self-pay | Admitting: *Deleted

## 2020-01-19 NOTE — Telephone Encounter (Signed)
-----   Message from [name], MD sent at 01/19/2020  6:59 AM EDT ----- Call - the heart is pumping like a champ!!  Taylor Flynn

## 2020-01-19 NOTE — Telephone Encounter (Signed)
Per MD, called pt, lmovm with Echo results. Request pt to call back with concerns.

## 2020-02-03 NOTE — Progress Notes (Unsigned)
Pharmacist Chemotherapy Monitoring - Follow Up Assessment    I verify that I have reviewed each item in the below checklist:  . Regimen for the patient is scheduled for the appropriate day and plan matches scheduled date. Marland Kitchen Appropriate non-routine labs are ordered dependent on drug ordered. . If applicable, additional medications reviewed and ordered per protocol based on lifetime cumulative doses and/or treatment regimen.   Plan for follow-up and/or issues identified: No . I-vent associated with next due treatment: No . MD and/or nursing notified: No  Taylor Flynn, Jacqlyn Larsen 02/03/2020 8:54 AM

## 2020-02-10 ENCOUNTER — Inpatient Hospital Stay

## 2020-02-10 ENCOUNTER — Inpatient Hospital Stay: Admitting: Family

## 2020-02-15 ENCOUNTER — Other Ambulatory Visit: Payer: Self-pay | Admitting: Hematology & Oncology

## 2020-06-06 ENCOUNTER — Telehealth: Payer: Self-pay | Admitting: Family

## 2020-06-06 ENCOUNTER — Telehealth: Payer: Self-pay | Admitting: *Deleted

## 2020-06-06 NOTE — Telephone Encounter (Signed)
This nurse is returning mother's phone call, [phone], and unable to leave a message due to the voice mail box is full.

## 2020-06-06 NOTE — Telephone Encounter (Signed)
lmom to inform patient of appt 8/9 at 0845 per 8/3 sch msg

## 2020-06-12 ENCOUNTER — Other Ambulatory Visit

## 2020-06-12 ENCOUNTER — Ambulatory Visit: Admitting: Family

## 2020-09-21 ENCOUNTER — Telehealth: Payer: Self-pay | Admitting: *Deleted

## 2020-09-21 NOTE — Telephone Encounter (Signed)
Received a call from patients mother asking if we have seen patient in the office recently.  Returned call but did not get message on answering machine.

## 2020-09-22 IMAGING — US US PELVIS COMPLETE WITH TRANSVAGINAL
1 series · 13 of 25 positions shown · non-contrast
Comparison: None

CLINICAL DATA: Lower pelvic pain. History of metastatic breast
cancer.



[Series 1: us pelvis complete with transvaginal · 0.21mm/px · 13 of 37 slices shown]
[im 1/37]
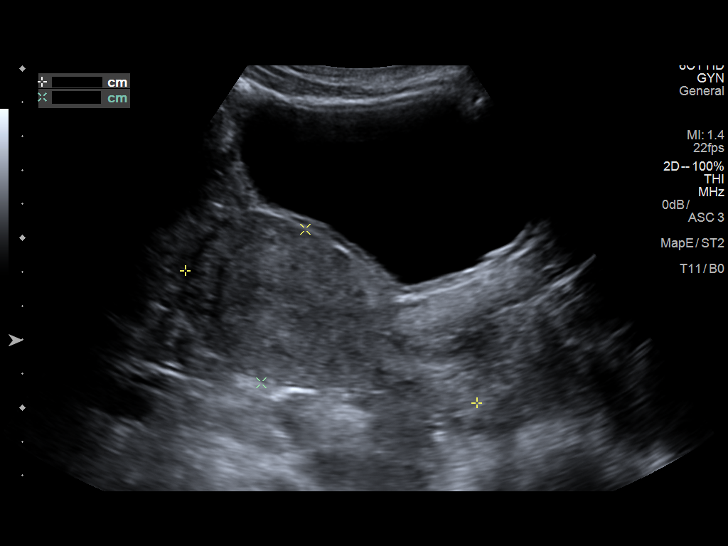
[im 4/37]
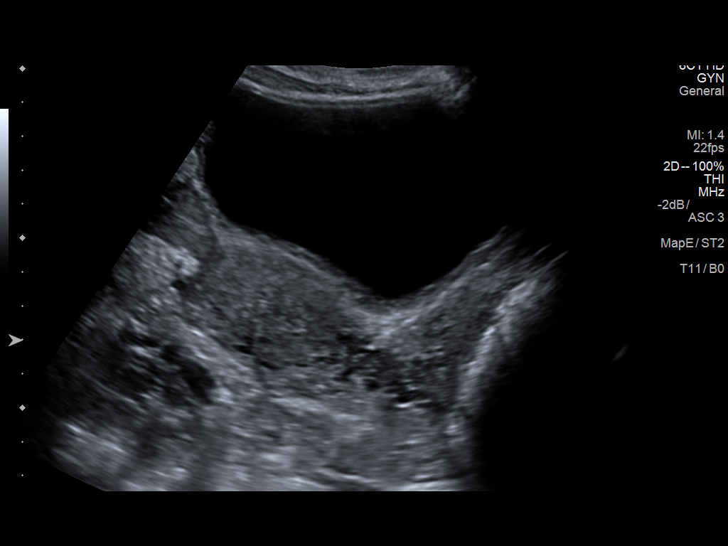
[im 7/37]
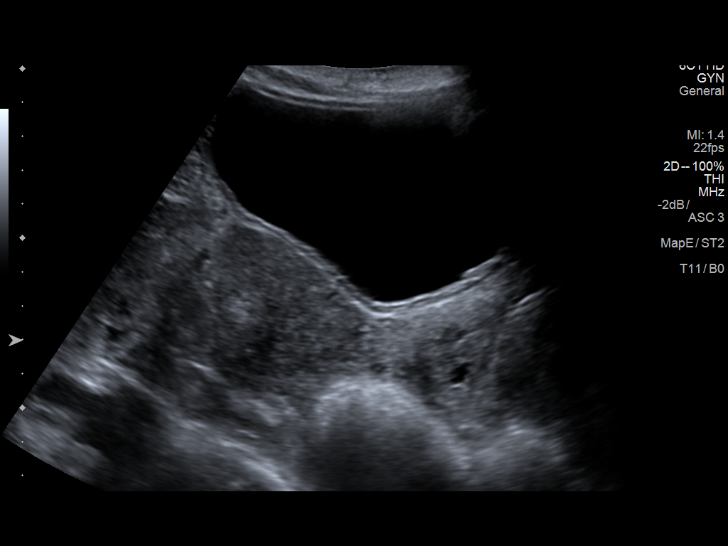
[im 10/37]
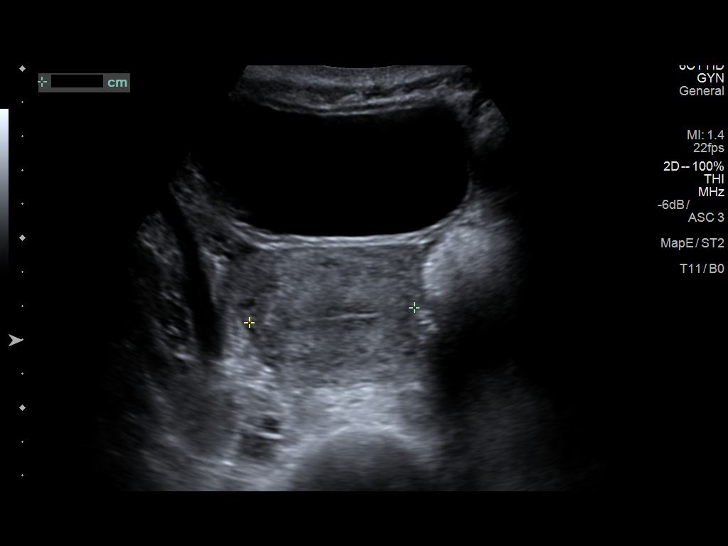
[im 13/37]
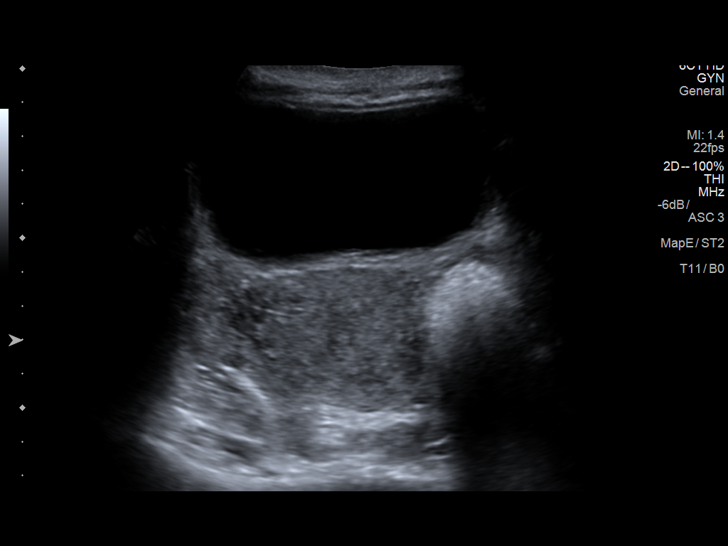
[im 16/37]
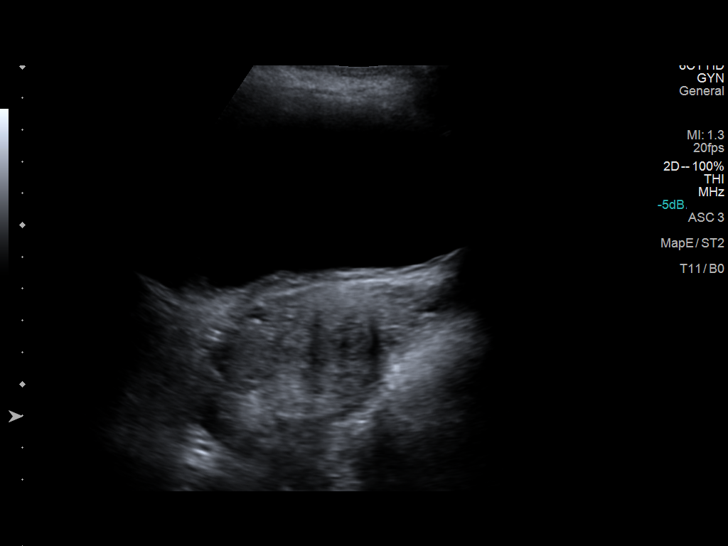
[im 19/37]
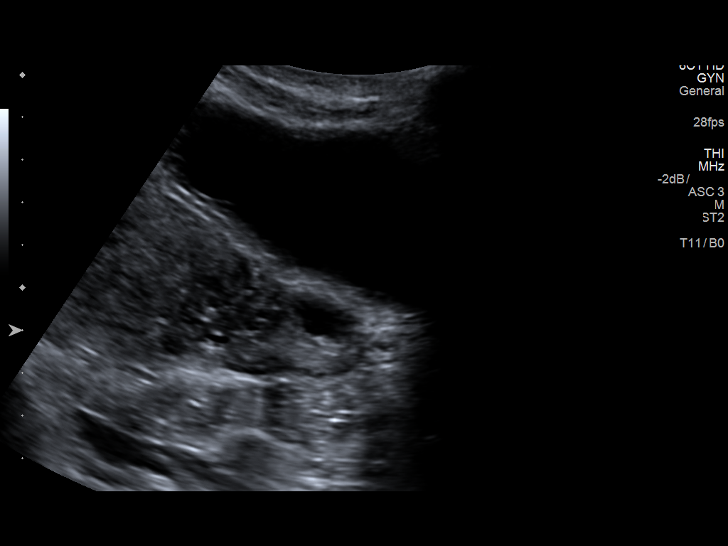
[im 22/37]
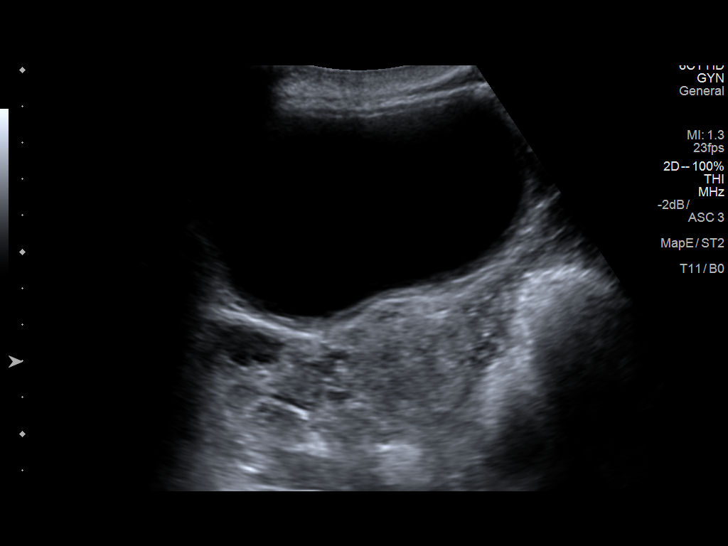
[im 25/37]
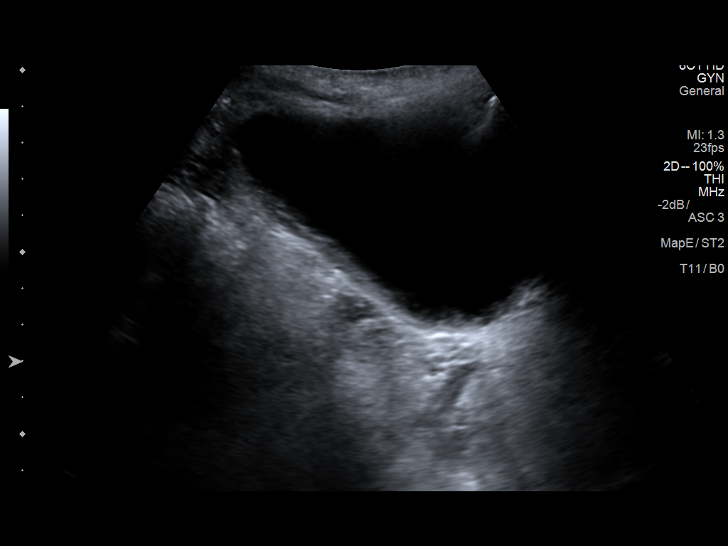
[im 28/37]
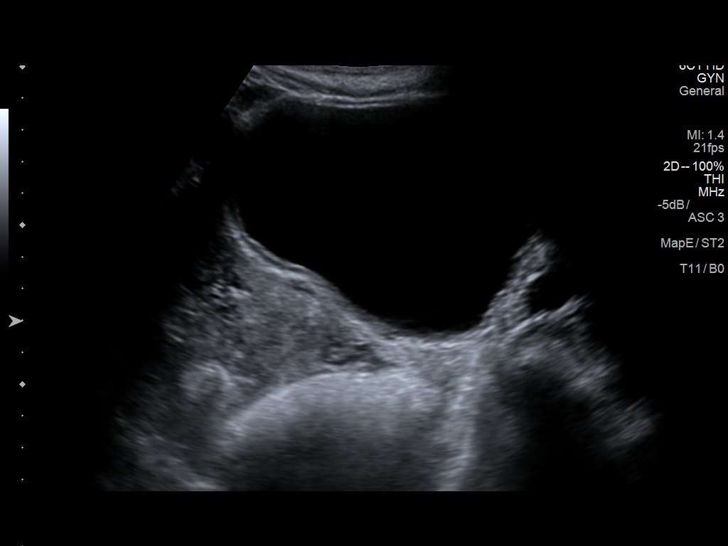
[im 31/37]
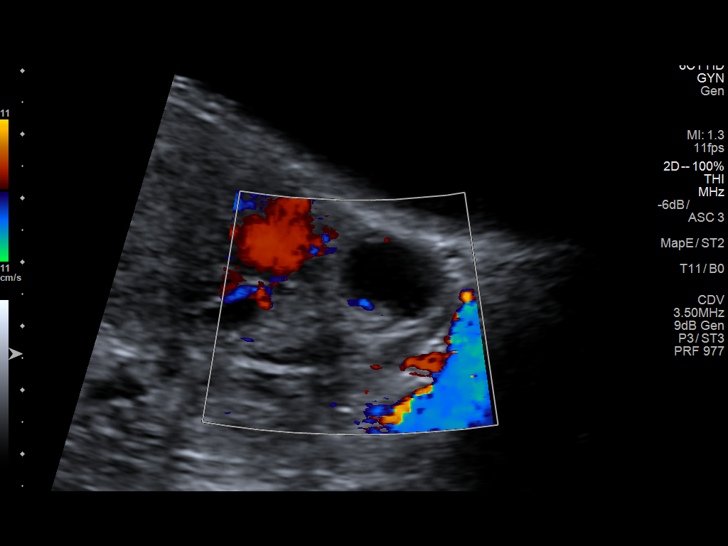
[im 34/37]
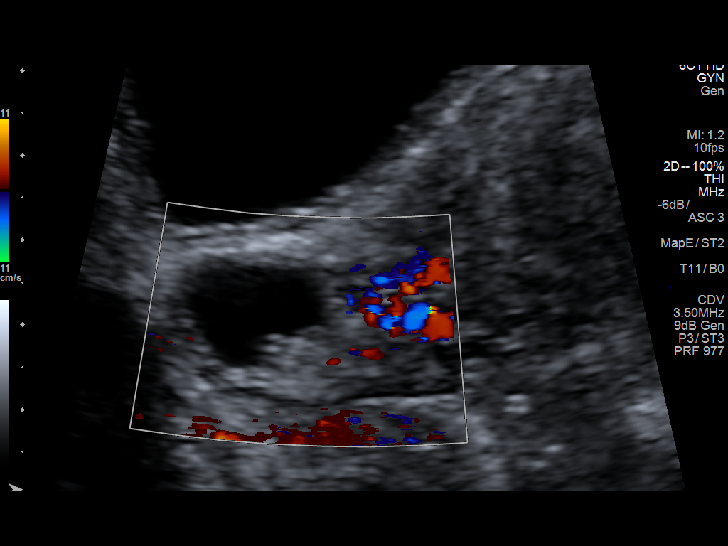
[im 37/37]
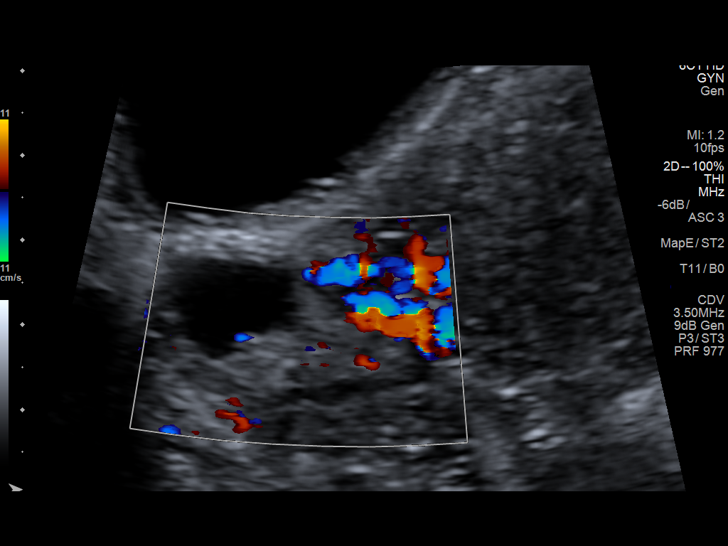

[13 of 25 positions shown; findings below may reference images not displayed]

FINDINGS: Uterus

Measurements: 9.4 x 4.7 x 5.8 cm = volume: 136 mL. Mildly
heterogeneous echotexture. No focal abnormality.

Endometrium

Thickness: 3 mm in thickness.  No focal abnormality visualized.

Right ovary

Measurements: 3.0 x 3.4 x 1.6 cm = volume: 8.6 mL. Normal
appearance/no adnexal mass. Small follicle.

Left ovary

Measurements: 2.9 x 1.1 x 2.3 cm = volume: 3.7 mL. Normal
appearance/no adnexal mass.

Other findings

Small amount of free fluid in the pelvis.
IMPRESSION: Mildly heterogeneous echotexture throughout the uterus. This can be
seen with adenomyosis.

No acute findings in the pelvis.

Lower pelvic pain. Metastatic breast cancer. Please select correct
"US Pelvis" template depending on combination of exams performed /
being read (e.g. both, Doppler, etc).

## 2020-10-15 ENCOUNTER — Encounter: Payer: Self-pay | Admitting: Family

## 2020-10-25 ENCOUNTER — Encounter: Payer: Self-pay | Admitting: Hematology & Oncology

## 2020-10-25 ENCOUNTER — Inpatient Hospital Stay (HOSPITAL_BASED_OUTPATIENT_CLINIC_OR_DEPARTMENT_OTHER): Admitting: Hematology & Oncology

## 2020-10-25 ENCOUNTER — Inpatient Hospital Stay

## 2020-10-25 ENCOUNTER — Other Ambulatory Visit: Payer: Self-pay

## 2020-10-25 ENCOUNTER — Inpatient Hospital Stay: Attending: Hematology & Oncology

## 2020-10-25 VITALS — BP 107/70 | HR 75 | Temp 97.9°F | Resp 18 | Ht 63.5 in | Wt 125.8 lb

## 2020-10-25 DIAGNOSIS — C787 Secondary malignant neoplasm of liver and intrahepatic bile duct: Secondary | ICD-10-CM | POA: Diagnosis not present

## 2020-10-25 DIAGNOSIS — Z79899 Other long term (current) drug therapy: Secondary | ICD-10-CM | POA: Diagnosis not present

## 2020-10-25 DIAGNOSIS — Z171 Estrogen receptor negative status [ER-]: Secondary | ICD-10-CM | POA: Insufficient documentation

## 2020-10-25 DIAGNOSIS — Z886 Allergy status to analgesic agent status: Secondary | ICD-10-CM | POA: Diagnosis not present

## 2020-10-25 DIAGNOSIS — C50912 Malignant neoplasm of unspecified site of left female breast: Secondary | ICD-10-CM | POA: Diagnosis present

## 2020-10-25 DIAGNOSIS — R519 Headache, unspecified: Secondary | ICD-10-CM | POA: Insufficient documentation

## 2020-10-25 DIAGNOSIS — C50919 Malignant neoplasm of unspecified site of unspecified female breast: Secondary | ICD-10-CM

## 2020-10-25 DIAGNOSIS — Z882 Allergy status to sulfonamides status: Secondary | ICD-10-CM | POA: Insufficient documentation

## 2020-10-25 LAB — CBC WITH DIFFERENTIAL (CANCER CENTER ONLY)
Abs Immature Granulocytes: 0.01 10*3/uL (ref 0.00–0.07)
Basophils Absolute: 0 10*3/uL (ref 0.0–0.1)
Basophils Relative: 0 %
Eosinophils Absolute: 0.1 10*3/uL (ref 0.0–0.5)
Eosinophils Relative: 1 %
HCT: 35.1 % — ABNORMAL LOW (ref 36.0–46.0)
Hemoglobin: 11.1 g/dL — ABNORMAL LOW (ref 12.0–15.0)
Immature Granulocytes: 0 %
Lymphocytes Relative: 33 %
Lymphs Abs: 1.7 10*3/uL (ref 0.7–4.0)
MCH: 28.3 pg (ref 26.0–34.0)
MCHC: 31.6 g/dL (ref 30.0–36.0)
MCV: 89.5 fL (ref 80.0–100.0)
Monocytes Absolute: 0.4 10*3/uL (ref 0.1–1.0)
Monocytes Relative: 8 %
Neutro Abs: 2.9 10*3/uL (ref 1.7–7.7)
Neutrophils Relative %: 58 %
Platelet Count: 255 10*3/uL (ref 150–400)
RBC: 3.92 MIL/uL (ref 3.87–5.11)
RDW: 16.2 % — ABNORMAL HIGH (ref 11.5–15.5)
WBC Count: 5.1 10*3/uL (ref 4.0–10.5)
nRBC: 0 % (ref 0.0–0.2)

## 2020-10-25 LAB — CMP (CANCER CENTER ONLY)
ALT: 11 U/L (ref 0–44)
AST: 18 U/L (ref 15–41)
Albumin: 4.3 g/dL (ref 3.5–5.0)
Alkaline Phosphatase: 42 U/L (ref 38–126)
Anion gap: 9 (ref 5–15)
BUN: 15 mg/dL (ref 6–20)
CO2: 26 mmol/L (ref 22–32)
Calcium: 9.3 mg/dL (ref 8.9–10.3)
Chloride: 107 mmol/L (ref 98–111)
Creatinine: 0.8 mg/dL (ref 0.44–1.00)
GFR, Estimated: >60 mL/min (ref >60)
Glucose, Bld: 65 mg/dL — ABNORMAL LOW (ref 70–99)
Potassium: 3.8 mmol/L (ref 3.5–5.1)
Sodium: 142 mmol/L (ref 135–145)
Total Bilirubin: 0.3 mg/dL (ref 0.3–1.2)
Total Protein: 6.8 g/dL (ref 6.5–8.1)

## 2020-10-25 LAB — LACTATE DEHYDROGENASE: LDH: 165 U/L (ref 98–192)

## 2020-10-25 LAB — C-REACTIVE PROTEIN: CRP: 0.6 mg/dL (ref <1.0)

## 2020-10-25 MED ORDER — SODIUM CHLORIDE 0.9% FLUSH
10.0000 mL | Freq: Once | INTRAVENOUS | Status: AC
  Administered 2020-10-25: 10 mL via INTRAVENOUS
  Filled 2020-10-25: qty 10

## 2020-10-25 MED ORDER — HEPARIN SOD (PORK) LOCK FLUSH 100 UNIT/ML IV SOLN
500.0000 [IU] | Freq: Once | INTRAVENOUS | Status: AC
  Administered 2020-10-25: 500 [IU] via INTRAVENOUS
  Filled 2020-10-25: qty 5

## 2020-10-25 NOTE — Patient Instructions (Signed)
Tunneled Central Venous Catheter Flushing Guide  It is important to flush your tunneled central venous catheter each time you use it, both before and after you use it. Flushing your catheter will help prevent it from clogging. What are the risks? Risks may include:  Infection.  Air getting into the catheter and bloodstream. Supplies needed:  A clean pair of gloves.  A disinfecting wipe. Use an alcohol wipe, chlorhexidine wipe, or iodine wipe as told by your health care provider.  A 10 mL syringe that has been prefilled with saline solution.  An empty 10 mL syringe, if a substance called heparin was injected into your catheter. How to flush your catheter When you flush your catheter, make sure you follow any specific instructions from your health care provider or the manufacturer. These are general guidelines. Flushing your catheter before use If there is heparin in your catheter: 1. Wash your hands with soap and water. 2. Put on gloves. 3. Scrub the injection cap for a minimum of 15 seconds with a disinfecting wipe. 4. Unclamp the catheter. 5. Attach the empty syringe to the injection cap. 6. Pull the syringe plunger back and withdraw 10 mL of blood. 7. Place the syringe into an appropriate waste container. 8. Scrub the injection cap for 15 seconds with a disinfecting wipe. 9. Attach the prefilled syringe to the injection cap. 10. Flush the catheter by pushing the plunger forward until all the liquid from the syringe is in the catheter. 11. Remove the syringe from the injection cap. 12. Clamp the catheter. If there is no heparin in your catheter: 1. Wash your hands with soap and water. 2. Put on gloves. 3. Scrub the injection cap for 15 seconds with a disinfecting wipe. 4. Unclamp the catheter. 5. Attach the prefilled syringe to the injection cap. 6. Flush the catheter by pushing the plunger forward until 5 mL of the liquid from the syringe is in the catheter. 7. Pull back on  the syringe until you see blood in the catheter. 8. If you have been asked to collect any blood, follow your health care provider's instructions. Otherwise, flush the catheter with the rest of the solution from the syringe. 9. Remove the syringe from the injection cap. 10. Clamp the catheter.  Flushing your catheter after use 1. Wash your hands with soap and water. 2. Put on gloves. 3. Scrub the injection cap for 15 seconds with a disinfecting wipe. 4. Unclamp the catheter. 5. Attach the prefilled syringe to the injection cap. 6. Flush the catheter by pushing the plunger forward until all of the liquid from the syringe is in the catheter. 7. Remove the syringe from the injection cap. 8. Clamp the catheter. Problems and solutions  If blood cannot be completely cleared from the injection cap, you may need to have the injection cap replaced.  If the catheter is difficult to flush, use the pulsing method. The pulsing method involves pushing only a few milliliters of solution into the catheter at a time and pausing between pushes.  If you do not see blood in the catheter when you pull back on the syringe, change your body position, such as by raising your arms above your head. Take a deep breath and cough. Then, pull back on the syringe. If you still do not see blood, flush the catheter with a small amount of solution. Then, change positions again and take a breath or cough. Pull back on the syringe again. If you still do not see   blood, finish flushing the catheter and contact your health care provider. Do not use your catheter until your health care provider says it is okay. General tips  Have someone help you flush your catheter, if possible.  Do not force fluid through your catheter.  Do not use a syringe that is larger or smaller than 10 mL. Using a smaller syringe can make the catheter burst.  Do not use your catheter without flushing it first if it has heparin in it. Contact a health  care provider if:  You cannot see any blood in the catheter when you flush it before using it.  Your catheter is difficult to flush. Get help right away if:  You cannot flush the catheter.  The catheter leaks when you flush it or when there is fluid in it.  There are cracks or breaks in the catheter. Summary  It is important to flush your tunneled central venous catheter each time you use it, both before and after you use it.  Scrub the injection cap for 15 seconds with a disinfecting wipe before and after you flush it.  When you flush your catheter, make sure you follow any specific instructions from your health care provider or the manufacturer.  Get help right away if you cannot flush the catheter. This information is not intended to replace advice given to you by your health care provider. Make sure you discuss any questions you have with your health care provider. Document Revised: 07/16/2019 Document Reviewed: 01/06/2019 Elsevier Patient Education  2020 Elsevier Inc[employer]  

## 2020-10-25 NOTE — Progress Notes (Signed)
Hematology and Oncology Follow Up Visit  Taylor Flynn [id number] 17-Jul-1976 44 y.o. 10/25/2020   Principle Diagnosis:  Metastatic breast cancer-ER-/HER-2+  Current Therapy:   Herceptin/Perjeta- s/p cycle17(previous cycles given in [place]) Zometa 4 mg IV q 2 months --next dose on January 2022    Interim History:  Taylor Flynn is here today for follow-up.  We have not seen her for 9 months.  She had a lot of personal issues that she had to attend to.  This is been quite difficult for her.  She has some challenging interactions with her family.  I know this was quite difficult and thankfully, she has gone through all of this.  She is quite strong.  Again, its been 9 months since she had treatment with [place].  Her Port-A-Cath is not been flushed.  We went ahead and flush that today.  It is really hard to say whether or not her cancer has progressed.  She has noticed some nodularity on the left anterior chest wall.  From what I remember, I will think these really were all that prominent.  She has had no pain.  Is been no problems with her appetite.  Her weight is gone up.  She has been having some headaches.  This is a little bit troublesome.  Her probably going to have to do an MRI of the brain to see exactly what is going on.  Her last CA 27.29 back in March was 17.  She has been seeing a holistic oncologic specialist.  She has had no obvious change in bowel or bladder habits.  There is been no bleeding.  She has had no dysphagia or odynophagia.  There has been no leg swelling.  She has had no rashes.  I am just glad that she was able to make it back to our office.  Overall, I would say her performance status is ECOG 1.    Medications:  Allergies as of 10/25/2020      Reactions   Aspirin Swelling   Angioedema    Sulfa Antibiotics Rash      Medication List       Accurate as of October 25, 2020  5:09 PM. If you have any questions, ask your nurse or doctor.        STOP  taking these medications   MELATONIN PO Stopped by: [name], MD     TAKE these medications   EPINEPHrine 0.3 mg/0.3 mL Soaj injection Commonly known as: EPI-PEN Inject 0.3 mg into the muscle as needed for anaphylaxis.   lidocaine-prilocaine cream Commonly known as: EMLA Apply 1 application topically as needed.   NALTREXONE HCL PO Take 4.5 mg by mouth See admin instructions. Take 4.5 mg by mouth at night Wen through Sat   NON FORMULARY 2 (two) times daily. Pancreatic enzymes 3 caps AC and 2 caps with meals.   PRESCRIPTION MEDICATION Inject 1 mL into the skin every Monday, Wednesday, and Friday.   VITAMIN K2 PO Take 15 drops by mouth 3 (three) times daily.       Allergies:  Allergies  Allergen Reactions  . Aspirin Swelling    Angioedema   . Sulfa Antibiotics Rash    Past Medical History, Surgical history, Social history, and Family History were reviewed and updated.  Review of Systems: Review of Systems  Constitutional: Negative.   HENT: Negative.   Eyes: Negative.   Respiratory: Negative.   Cardiovascular: Negative.   Gastrointestinal: Negative.   Genitourinary: Negative.  Musculoskeletal: Negative.   Skin: Negative.   Neurological: Negative.   Endo/Heme/Allergies: Negative.   Psychiatric/Behavioral: Negative.      Physical Exam:  height is 5' 3.5" (1.613 m) and weight is 125 lb 12.8 oz (57.1 kg). Her oral temperature is 97.9 F (36.6 C). Her blood pressure is 107/70 and her pulse is 75. Her respiration is 18 and oxygen saturation is 100%.   Wt Readings from Last 3 Encounters:  10/25/20 125 lb 12.8 oz (57.1 kg)  01/13/20 118 lb (53.5 kg)  01/13/20 118 lb (53.5 kg)    Physical Exam Vitals reviewed.  Constitutional:      Comments: Her chest wall exam shows the left lumpectomy.  She bases had a mastectomy there now.  She has changes from surgery.  She has the healing lumpectomy scar.  There is no obvious discharge.  There is no bleeding.   There is no exudate or erythema.  There is no left axillary adenopathy.  HENT:     Head: Normocephalic and atraumatic.  Eyes:     Pupils: Pupils are equal, round, and reactive to light.  Cardiovascular:     Rate and Rhythm: Normal rate and regular rhythm.     Heart sounds: Normal heart sounds.  Pulmonary:     Effort: Pulmonary effort is normal.     Breath sounds: Normal breath sounds.  Abdominal:     General: Bowel sounds are normal.     Palpations: Abdomen is soft.  Musculoskeletal:        General: No tenderness or deformity. Normal range of motion.     Cervical back: Normal range of motion.  Lymphadenopathy:     Cervical: No cervical adenopathy.  Skin:    General: Skin is warm and dry.     Findings: No erythema or rash.  Neurological:     Mental Status: She is alert and oriented to person, place, and time.  Psychiatric:        Behavior: Behavior normal.        Thought Content: Thought content normal.        Judgment: Judgment normal.      Lab Results  Component Value Date   WBC 5.1 10/25/2020   HGB 11.1 (L) 10/25/2020   HCT 35.1 (L) 10/25/2020   MCV 89.5 10/25/2020   PLT 255 10/25/2020   Lab Results  Component Value Date   FERRITIN 6 (L) 12/16/2019   IRON 32 (L) 12/16/2019   TIBC 391 12/16/2019   UIBC 359 12/16/2019   IRONPCTSAT 8 (L) 12/16/2019   Lab Results  Component Value Date   RETICCTPCT 0.8 11/17/2018   RBC 3.92 10/25/2020   No results found for: KPAFRELGTCHN, LAMBDASER, KAPLAMBRATIO No results found for: IGGSERUM, IGA, IGMSERUM No results found for: Ronnald Ramp, A1GS, Nelida Meuse, SPEI   Chemistry      Component Value Date/Time   NA 142 10/25/2020 1455   K 3.8 10/25/2020 1455   CL 107 10/25/2020 1455   CO2 26 10/25/2020 1455   BUN 15 10/25/2020 1455   CREATININE 0.80 10/25/2020 1455      Component Value Date/Time   CALCIUM 9.3 10/25/2020 1455   ALKPHOS 42 10/25/2020 1455   AST 18 10/25/2020 1455   ALT  11 10/25/2020 1455   BILITOT 0.3 10/25/2020 1455       Impression and Plan:   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  She  has been on anti-HER-2 therapy.  She had been doing well with this.  We probably do need to rescan her.  Probably need to see exactly where things are with respect to her malignancy.  I worried about the headaches.  I think we have to do a MRI to see that she has no brain metastasis.  A PET scan also would be helpful.  Her tumor markers will also give [place] an idea as to whether or not she has progressive disease.  I would think that since she is not had treatment in 9 months that we can get her back onto Herceptin/Perjeta.  I think this would be reasonable.  Another option would be the use of Enhertu.  This also has been shown to be quite effective in metastatic disease for patients who are HER-2 positive.  I am really happy that she is able to come back.  She was given on herself about not being here 9 months.  I told her not to be so hard on herself.  We will have tribulations that we go through and that we come out better on the other side.  She and her family are going to [facility] next week.  As such, we will try to start therapy in January.  I spent about 60 minutes with her today.  It has been a long time since we had seen her.    [name], MD 12/22/20215:09 PM

## 2020-10-26 ENCOUNTER — Other Ambulatory Visit: Payer: Self-pay | Admitting: *Deleted

## 2020-10-26 DIAGNOSIS — C50919 Malignant neoplasm of unspecified site of unspecified female breast: Secondary | ICD-10-CM

## 2020-10-26 DIAGNOSIS — C787 Secondary malignant neoplasm of liver and intrahepatic bile duct: Secondary | ICD-10-CM

## 2020-10-26 DIAGNOSIS — C50912 Malignant neoplasm of unspecified site of left female breast: Secondary | ICD-10-CM

## 2020-10-26 LAB — CANCER ANTIGEN 19-9: CA 19-9: 43 U/mL — ABNORMAL HIGH (ref 0–35)

## 2020-10-26 LAB — CHROMOGRANIN A

## 2020-10-26 LAB — CANCER ANTIGEN 27.29: CA 27.29: 11.5 U/mL (ref 0.0–38.6)

## 2020-11-02 ENCOUNTER — Other Ambulatory Visit (HOSPITAL_COMMUNITY)

## 2020-11-06 ENCOUNTER — Telehealth: Payer: Self-pay | Admitting: *Deleted

## 2020-11-06 ENCOUNTER — Ambulatory Visit (HOSPITAL_COMMUNITY)
Admission: RE | Admit: 2020-11-06 | Discharge: 2020-11-06 | Disposition: A | Discharge status: Home / Self Care | Payer: Medicare Other | Source: Ambulatory Visit | Attending: Hematology & Oncology | Admitting: Hematology & Oncology

## 2020-11-06 ENCOUNTER — Other Ambulatory Visit: Payer: Self-pay

## 2020-11-06 ENCOUNTER — Other Ambulatory Visit: Payer: Self-pay | Admitting: Hematology & Oncology

## 2020-11-06 DIAGNOSIS — C787 Secondary malignant neoplasm of liver and intrahepatic bile duct: Secondary | ICD-10-CM | POA: Diagnosis not present

## 2020-11-06 DIAGNOSIS — I340 Nonrheumatic mitral (valve) insufficiency: Secondary | ICD-10-CM | POA: Diagnosis not present

## 2020-11-06 DIAGNOSIS — C50919 Malignant neoplasm of unspecified site of unspecified female breast: Secondary | ICD-10-CM | POA: Diagnosis not present

## 2020-11-06 DIAGNOSIS — Z0189 Encounter for other specified special examinations: Secondary | ICD-10-CM | POA: Diagnosis not present

## 2020-11-06 DIAGNOSIS — Z01818 Encounter for other preprocedural examination: Secondary | ICD-10-CM | POA: Diagnosis not present

## 2020-11-06 LAB — ECHOCARDIOGRAM COMPLETE
Area-P 1/2: 3.65 cm2
Calc EF: 64.5 %
S' Lateral: 2.1 cm
Single Plane A2C EF: 71 %
Single Plane A4C EF: 61.3 %

## 2020-11-06 NOTE — Telephone Encounter (Signed)
Patient called stating that she has a "scab" where she has had recurrence.  Asking if scab can be sent to pathology to determine if it is HER 2 positive or not.  Unfortunately, per Dr Myna Hidalgo it cannot because it needs to be a sterile specimen, and sent through pathology department. Patient understands and will be here on Wed for treatment

## 2020-11-06 NOTE — Telephone Encounter (Signed)
-----   Message from Josph Macho, MD sent at 11/06/2020  2:23 PM EST ----- Call - the echocardiogram show fantastic cardiac function!!  [name]

## 2020-11-06 NOTE — Telephone Encounter (Signed)
Unable to reach pt, lmovm for pt with results. °

## 2020-11-06 NOTE — Progress Notes (Signed)
  Echocardiogram 2D Echocardiogram has been performed.  Janalyn Harder 11/06/2020, 10:12 AM

## 2020-11-08 ENCOUNTER — Inpatient Hospital Stay: Payer: Medicare Other

## 2020-11-08 ENCOUNTER — Other Ambulatory Visit: Payer: Self-pay | Admitting: *Deleted

## 2020-11-08 ENCOUNTER — Telehealth: Payer: Self-pay | Admitting: Hematology & Oncology

## 2020-11-08 ENCOUNTER — Other Ambulatory Visit: Payer: Self-pay

## 2020-11-08 ENCOUNTER — Inpatient Hospital Stay: Payer: Medicare Other | Attending: Hematology & Oncology

## 2020-11-08 ENCOUNTER — Telehealth: Payer: Self-pay

## 2020-11-08 ENCOUNTER — Other Ambulatory Visit: Payer: Self-pay | Admitting: Hematology & Oncology

## 2020-11-08 DIAGNOSIS — C787 Secondary malignant neoplasm of liver and intrahepatic bile duct: Secondary | ICD-10-CM

## 2020-11-08 DIAGNOSIS — Z5112 Encounter for antineoplastic immunotherapy: Secondary | ICD-10-CM | POA: Insufficient documentation

## 2020-11-08 DIAGNOSIS — C7931 Secondary malignant neoplasm of brain: Secondary | ICD-10-CM | POA: Insufficient documentation

## 2020-11-08 DIAGNOSIS — C50919 Malignant neoplasm of unspecified site of unspecified female breast: Secondary | ICD-10-CM

## 2020-11-08 DIAGNOSIS — R978 Other abnormal tumor markers: Secondary | ICD-10-CM | POA: Insufficient documentation

## 2020-11-08 DIAGNOSIS — Z171 Estrogen receptor negative status [ER-]: Secondary | ICD-10-CM | POA: Insufficient documentation

## 2020-11-08 DIAGNOSIS — C50912 Malignant neoplasm of unspecified site of left female breast: Secondary | ICD-10-CM

## 2020-11-08 LAB — CMP (CANCER CENTER ONLY)
ALT: 12 U/L (ref 0–44)
AST: 18 U/L (ref 15–41)
Albumin: 4 g/dL (ref 3.5–5.0)
Alkaline Phosphatase: 39 U/L (ref 38–126)
Anion gap: 8 (ref 5–15)
BUN: 18 mg/dL (ref 6–20)
CO2: 28 mmol/L (ref 22–32)
Calcium: 9.3 mg/dL (ref 8.9–10.3)
Chloride: 104 mmol/L (ref 98–111)
Creatinine: 0.91 mg/dL (ref 0.44–1.00)
GFR, Estimated: >60 mL/min (ref >60)
Glucose, Bld: 73 mg/dL (ref 70–99)
Potassium: 3.9 mmol/L (ref 3.5–5.1)
Sodium: 140 mmol/L (ref 135–145)
Total Bilirubin: 0.3 mg/dL (ref 0.3–1.2)
Total Protein: 6.3 g/dL — ABNORMAL LOW (ref 6.5–8.1)

## 2020-11-08 LAB — CBC WITH DIFFERENTIAL (CANCER CENTER ONLY)
Abs Immature Granulocytes: 0 10*3/uL (ref 0.00–0.07)
Basophils Absolute: 0 10*3/uL (ref 0.0–0.1)
Basophils Relative: 1 %
Eosinophils Absolute: 0.1 10*3/uL (ref 0.0–0.5)
Eosinophils Relative: 2 %
HCT: 33.8 % — ABNORMAL LOW (ref 36.0–46.0)
Hemoglobin: 10.7 g/dL — ABNORMAL LOW (ref 12.0–15.0)
Immature Granulocytes: 0 %
Lymphocytes Relative: 38 %
Lymphs Abs: 1.3 10*3/uL (ref 0.7–4.0)
MCH: 28.5 pg (ref 26.0–34.0)
MCHC: 31.7 g/dL (ref 30.0–36.0)
MCV: 89.9 fL (ref 80.0–100.0)
Monocytes Absolute: 0.4 10*3/uL (ref 0.1–1.0)
Monocytes Relative: 11 %
Neutro Abs: 1.6 10*3/uL — ABNORMAL LOW (ref 1.7–7.7)
Neutrophils Relative %: 48 %
Platelet Count: 207 10*3/uL (ref 150–400)
RBC: 3.76 MIL/uL — ABNORMAL LOW (ref 3.87–5.11)
RDW: 15.6 % — ABNORMAL HIGH (ref 11.5–15.5)
WBC Count: 3.4 10*3/uL — ABNORMAL LOW (ref 4.0–10.5)
nRBC: 0 % (ref 0.0–0.2)

## 2020-11-08 MED ORDER — LIDOCAINE-PRILOCAINE 2.5-2.5 % EX CREA: 1.0000 "application " | TOPICAL_CREAM | CUTANEOUS | 2 refills | Status: DC | PRN

## 2020-11-08 MED ORDER — HEPARIN SOD (PORK) LOCK FLUSH 100 UNIT/ML IV SOLN
500.0000 [IU] | Freq: Once | INTRAVENOUS | Status: AC
  Administered 2020-11-08: 500 [IU] via INTRAVENOUS
  Filled 2020-11-08: qty 5

## 2020-11-08 MED ORDER — SODIUM CHLORIDE 0.9% FLUSH
10.0000 mL | INTRAVENOUS | Status: DC | PRN
  Administered 2020-11-08: 10 mL via INTRAVENOUS
  Filled 2020-11-08: qty 10

## 2020-11-08 NOTE — Patient Instructions (Signed)

## 2020-11-08 NOTE — Telephone Encounter (Signed)
Pt came in for lab flush and tx today.  She would like to postpone the tx as she would like to get her scan results first which are not until 11/09/20 and she thought she was seeing dr PE also today .  I have r/s todays appts to 11/17/20 amd pushed out her other appt/visit to 12/08/20.  Pt did not want me to print out the appts     aom

## 2020-11-08 NOTE — Telephone Encounter (Signed)
Pt advised she could not find her emla cream this morning. New Rx called into pt's pharmacy.

## 2020-11-08 NOTE — Progress Notes (Signed)
Pt here port accessed, and advised she doesn't want to continue with treatment until she gets a scan and discusses the results with MD. Port flushed and deaccessed per pt request. Pt advised she will call back to reschedule. Labs drawn

## 2020-11-08 NOTE — Telephone Encounter (Signed)
Patient came in for treatment and knew she was supposed to have treatment today.  Patient refused treatment today 11/09/2019.  She was aware that Dr Myna Hidalgo was not in the office today and he was in agreement with this plan to have her 1st treatment today. She is having her scans on 1/6 and will return to the office on 1/26 for MD visit , and then 1st treatment of RESTART.  Patient left the office after having her labs & port flush appointments completed. I discussed this with Seward Grater and she stated just keep her appointments for 11/30/2019

## 2020-11-09 ENCOUNTER — Encounter (HOSPITAL_COMMUNITY)
Admission: RE | Admit: 2020-11-09 | Discharge: 2020-11-09 | Disposition: A | Discharge status: Home / Self Care | Source: Ambulatory Visit | Attending: Hematology & Oncology | Admitting: Hematology & Oncology

## 2020-11-09 ENCOUNTER — Telehealth: Payer: Self-pay | Admitting: Hematology & Oncology

## 2020-11-09 ENCOUNTER — Telehealth: Payer: Self-pay | Admitting: *Deleted

## 2020-11-09 ENCOUNTER — Inpatient Hospital Stay (HOSPITAL_BASED_OUTPATIENT_CLINIC_OR_DEPARTMENT_OTHER): Payer: Medicare Other | Admitting: Hematology & Oncology

## 2020-11-09 VITALS — BP 135/68 | HR 107 | Resp 19

## 2020-11-09 DIAGNOSIS — C50919 Malignant neoplasm of unspecified site of unspecified female breast: Secondary | ICD-10-CM | POA: Diagnosis not present

## 2020-11-09 DIAGNOSIS — C7931 Secondary malignant neoplasm of brain: Secondary | ICD-10-CM | POA: Diagnosis not present

## 2020-11-09 DIAGNOSIS — Z7189 Other specified counseling: Secondary | ICD-10-CM

## 2020-11-09 DIAGNOSIS — C787 Secondary malignant neoplasm of liver and intrahepatic bile duct: Secondary | ICD-10-CM

## 2020-11-09 DIAGNOSIS — Z5112 Encounter for antineoplastic immunotherapy: Secondary | ICD-10-CM | POA: Diagnosis not present

## 2020-11-09 LAB — CHROMOGRANIN A: Chromogranin A (ng/mL): 126.6 ng/mL — ABNORMAL HIGH (ref 0.0–101.8)

## 2020-11-09 LAB — GLUCOSE, CAPILLARY: Glucose-Capillary: 47 mg/dL — ABNORMAL LOW (ref 70–99)

## 2020-11-09 MED ORDER — GADOBUTROL 1 MMOL/ML IV SOLN
5.0000 mL | Freq: Once | INTRAVENOUS | Status: AC | PRN
  Administered 2020-11-09: 5 mL via INTRAVENOUS

## 2020-11-09 MED ORDER — DEXAMETHASONE 4 MG PO TABS: 4.0000 mg | ORAL_TABLET | Freq: Two times a day (BID) | ORAL | 1 refills | Status: DC

## 2020-11-09 MED ORDER — FLUDEOXYGLUCOSE F - 18 (FDG) INJECTION
7.1000 | Freq: Once | INTRAVENOUS | Status: AC
  Administered 2020-11-09: 7.1 via INTRAVENOUS

## 2020-11-09 MED ORDER — ESCITALOPRAM OXALATE 5 MG PO TABS: 5.0000 mg | ORAL_TABLET | Freq: Every day | ORAL | 3 refills | Status: DC

## 2020-11-09 NOTE — Telephone Encounter (Signed)
-----   Message from Taylor Macho, MD sent at 11/09/2020  4:05 PM EST ----- Call - there is only activity on the LEFT chest wall.  Liver, Lungs, bones all look good!!  [name]

## 2020-11-09 NOTE — Progress Notes (Signed)
Hematology and Oncology Follow Up Visit  Taylor Flynn [id number] 1976/01/08 45 y.o. 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  When I had seen her recently, she was complaining of some headache.  She had no neurological symptoms.  She had no dizziness.  There is no double vision.  She had no change in bowel or bladder habits.  We did get an MRI.  Shockingly, the MRI did show 3 lesions.  These lesions were in the cerebellum.  There were not that big.  There was a little bit of edema.  I did go ahead and put her on Decadron at 4 mg p.o. twice daily.  She comes in with her mom and with her brother.  Again, we had not seen her for about 9 months.  She been off therapy for 9 months.  We need to get stereotactic radiosurgery involved for these lesions.  I spoke with Taylor Flynn of Radiation Oncology.  He will get her set up.  As far as a PET scan that she had done today, there was some disease activity on her chest wall.  She had 4 small lesions.  Again, these were on the left anterior chest wall.  We had done a biopsy of one of the lesions about 2 years ago.  The lesion was ER negative but HER2 positive.  At least, she does not have a lot of metastatic disease.  Her last CA 27-29 in December was 11.5.  She did have an elevated CA 19-9 of 43.  An elevated chromogranin A level was 127.  I am not sure exactly what this indicates.  She still is in good shape.  She is obviously upset.  I just feel bad for her.  Thankfully she does have a lot of support from her family.  Currently, I will set her performance status is ECOG 1.      Medications:  Allergies as of 11/09/2020      Reactions   Aspirin Swelling   Angioedema    Sulfa Antibiotics Rash      Medication List       Accurate as of November 09, 2020  1:59 PM. If you have any questions, ask your nurse or doctor.        dexamethasone 4 MG tablet Commonly known as: DECADRON Take 1 tablet (4 mg total) by mouth 2 (two) times daily. Started by: Taylor Napoleon, MD   EPINEPHrine 0.3 mg/0.3 mL Soaj injection Commonly known as: EPI-PEN Inject 0.3 mg into the muscle as needed for anaphylaxis.   escitalopram 5 MG tablet Commonly known as: Lexapro Take 1 tablet (5 mg total) by mouth daily. Started by: Taylor Napoleon, MD   lidocaine-prilocaine cream Commonly known as: EMLA Apply 1 application topically as needed.   NALTREXONE HCL PO Take 4.5 mg by mouth See admin instructions. Take 4.5 mg by mouth at night Wen through Sat   NON FORMULARY 2 (two) times daily. Pancreatic enzymes 3 caps AC and 2 caps with meals.   PRESCRIPTION MEDICATION Inject 1 mL into the skin every Monday, Wednesday, and Friday.   VITAMIN  K2 PO Take 15 drops by mouth 3 (three) times daily.       Allergies:  Allergies  Allergen Reactions  . Aspirin Swelling    Angioedema   . Sulfa Antibiotics Rash    Past Medical History, Surgical history, Social history, and Family History were reviewed and updated.  Review of Systems: Review of Systems  Constitutional: Negative.   HENT: Negative.   Eyes: Negative.   Respiratory: Negative.   Cardiovascular: Negative.   Gastrointestinal: Negative.   Genitourinary: Negative.   Musculoskeletal: Negative.   Skin: Negative.   Neurological: Negative.   Endo/Heme/Allergies: Negative.   Psychiatric/Behavioral: Negative.      Physical Exam:  blood pressure is 135/68 and her pulse is 107 (abnormal). Her respiration is 19 and oxygen saturation is 94%.   Wt Readings from Last 3 Encounters:  10/25/20 125 lb 12.8 oz (57.1 kg)  01/13/20 118  lb (53.5 kg)  01/13/20 118 lb (53.5 kg)    Physical Exam Vitals reviewed.  Constitutional:      Comments: Her chest wall exam shows the left lumpectomy.  She bases had a mastectomy there now.  She has changes from surgery.  She has the healing lumpectomy scar.  There is no obvious discharge.  There is no bleeding.  There is no exudate or erythema.  There is no left axillary adenopathy.  HENT:     Head: Normocephalic and atraumatic.  Eyes:     Pupils: Pupils are equal, round, and reactive to light.  Cardiovascular:     Rate and Rhythm: Normal rate and regular rhythm.     Heart sounds: Normal heart sounds.  Pulmonary:     Effort: Pulmonary effort is normal.     Breath sounds: Normal breath sounds.  Abdominal:     General: Bowel sounds are normal.     Palpations: Abdomen is soft.  Musculoskeletal:        General: No tenderness or deformity. Normal range of motion.     Cervical back: Normal range of motion.  Lymphadenopathy:     Cervical: No cervical adenopathy.  Skin:    General: Skin is warm and dry.     Findings: No erythema or rash.  Neurological:     Mental Status: She is alert and oriented to person, place, and time.  Psychiatric:        Behavior: Behavior normal.        Thought Content: Thought content normal.        Judgment: Judgment normal.      Lab Results  Component Value Date   WBC 3.4 (L) 11/08/2020   HGB 10.7 (L) 11/08/2020   HCT 33.8 (L) 11/08/2020   MCV 89.9 11/08/2020   PLT 207 11/08/2020   Lab Results  Component Value Date   FERRITIN 6 (L) 12/16/2019   IRON 32 (L) 12/16/2019   TIBC 391 12/16/2019   UIBC 359 12/16/2019   IRONPCTSAT 8 (L) 12/16/2019   Lab Results  Component Value Date   RETICCTPCT 0.8 11/17/2018   RBC 3.76 (L) 11/08/2020   No results found for: KPAFRELGTCHN, LAMBDASER, KAPLAMBRATIO No results found for: IGGSERUM, IGA, IGMSERUM No results found for: Odetta Pink, SPEI    Chemistry      Component Value Date/Time   NA 140 11/08/2020 0824   K 3.9 11/08/2020 0824   CL 104 11/08/2020 0824   CO2 28 11/08/2020 0824   BUN 18 11/08/2020 0824   CREATININE 0.91 11/08/2020  [id number]      Component Value Date/Time   CALCIUM 9.3 11/08/2020 0824   ALKPHOS 39 11/08/2020 0824   AST 18 11/08/2020 0824   ALT 12 11/08/2020 0824   BILITOT 0.3 11/08/2020 0824       Impression and Plan:   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  I guess it really should not be all a surprise about the CNS metastasis.  I think that she would be a great candidate for stereotactic radiosurgery.  Again, I have spoken with Dr. Teryl Lucy of Radiation Oncology and he will see her..  I think this is a perfect time to switch her therapy.  She has always been very reluctant to go with chemotherapy.  I think that Kadcyla would not be a bad idea.  I realize that there was a recent trial that showed that Enhertu probably had a more substantial effect on metastatic disease and survival.  However, I think she has heard about Enhertu in the pulmonary problems.  She would be reluctant to consider this.  I think she is still seen her naturopathic doctor.  I really do not have any problems with this.  I really do not see that there is can be a problem with respect to radiation therapy and Kadcyla.  We will hopefully get Kadcyla started in a week.  Kadcyla will be every 3 weeks.  I will have to talk to Dr. Amalia Hailey about making sure that she is diligent with getting here every 3 weeks.  I spent about 60 minutes with her and her family today.     Taylor Napoleon, MD 1/6/20221:59 PM

## 2020-11-09 NOTE — Telephone Encounter (Signed)
Called pt with Results, pt advised she just saw them in [place] as well. No concerns at this time. Pt thanked me for the call.

## 2020-11-09 NOTE — Telephone Encounter (Signed)
Called and LMVM for patient to call me back regarding adding her in for appointment for today per verbal request for Dr Myna Hidalgo 1/6

## 2020-11-10 ENCOUNTER — Other Ambulatory Visit: Payer: Self-pay | Admitting: Radiation Therapy

## 2020-11-10 ENCOUNTER — Telehealth: Payer: Self-pay | Admitting: Hematology & Oncology

## 2020-11-10 DIAGNOSIS — C7949 Secondary malignant neoplasm of other parts of nervous system: Secondary | ICD-10-CM

## 2020-11-10 DIAGNOSIS — C7931 Secondary malignant neoplasm of brain: Secondary | ICD-10-CM

## 2020-11-10 NOTE — Telephone Encounter (Signed)
No los 1/6 

## 2020-11-10 NOTE — Progress Notes (Signed)
Port Access orders placed for [facility] the day of Brain MRI.   Mont Dutton R.T.(R)(T) Radiation Special Procedures Navigator

## 2020-11-13 ENCOUNTER — Encounter: Payer: Self-pay | Admitting: Hematology & Oncology

## 2020-11-13 DIAGNOSIS — Z7189 Other specified counseling: Secondary | ICD-10-CM | POA: Insufficient documentation

## 2020-11-13 DIAGNOSIS — C50919 Malignant neoplasm of unspecified site of unspecified female breast: Secondary | ICD-10-CM

## 2020-11-13 DIAGNOSIS — C7931 Secondary malignant neoplasm of brain: Secondary | ICD-10-CM | POA: Insufficient documentation

## 2020-11-13 HISTORY — DX: Malignant neoplasm of unspecified site of unspecified female breast: C50.919

## 2020-11-13 HISTORY — DX: Other specified counseling: Z71.89

## 2020-11-13 NOTE — Progress Notes (Signed)
Radiation Oncology         [phone] ________________________________  Initial Outpatient Consultation  The patient opted for telemedicine to maximize safety during the pandemic.  MyChart video was used  Name: Taylor Flynn MRN: [id number]  Date: 11/14/2020  DOB: Nov 20, 1975  CC:[name], MD  [name], MD   REFERRING PHYSICIAN: [name], MD  DIAGNOSIS:  C79.31 Brain metastases   ICD-10-CM   1. Secondary malignant neoplasm of brain and spinal cord (HCC)  C79.31    C79.49   2. 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 Marin Olp for an opinion concerning radiation therapy as part of management for her recently diagnosed brains metastases.  In January of 2020, the patient was diagnosed with metastatic breast cancer. She was treated with multiple cycles of Herceptin and Perjeta. She has been on maintenance Zometa, last dose which was this month.  MRI of the brain on 11/09/2020 demonstrated three enhancing cerebellar lesions consistent with metastases. There was also associated mild-to-moderate edema without posterior fossa mass effect. Supratentorial brain was unremarkable in appearance.  PET scan on 11/09/2020 showed a small foci of hypermetabolic activity identified previously in the anterior left chest wall that were persistent with interval development of two additional similar hypermetabolic foci. All four were along the periphery of the apparent treatment site of the left breast. Although there were no soft tissue nodules evident at those locations on the previous PET-CT scan, there did appear to be nodular soft tissue at the sites of hypermetabolism on this exam. As such, imaging features were highly suspicious for recurrent/metastatic disease. There was no evidence for unexpected hypermetabolic disease in the  neck, abdomen, or pelvis.  I have personally reviewed her images  Pain on a scale of 0-10 is: Chronic headaches; rates 3-4 out of 10   Ambulatory status? Walker? Wheelchair?: Patient is ambulatory without assistance   Dose of Decadron, if applicable: 4 mg PO twice a day (started 11/09/2020)  Recent neurologic symptoms, if any:   Seizures: Patient denies   Headaches: 3-4 out of 10, intermittent but chronic. Located mainly right frontal (above orbit) to right parietal   Nausea: Patient denies  Dizziness/ataxia: Patient denies, but occasionally feels off balance if she turns too quickly (maybe related to uneven leg lengths--reports it doesn't happen is she wears her heel lift)  Difficulty with hand coordination: Patient denies  Focal numbness/weakness: Typically no, but reports that this AM reports her right leg took awhile to "wake up" while she was meditating  Visual deficits/changes: Not currently, but recently got new glasses  Confusion/Memory deficits: Patient denies   SAFETY ISSUES:  Prior radiation? No  Pacemaker/ICD? No  Possible current pregnancy? No--postmenopausal due to medications  Is the patient on methotrexate? No (was taking for a brief period early on in breast cancer treatment. Reports she's been off for ~18 months off)  Additional Complaints / other details:  Patient has not received COVID vaccines or annual flu shot  HAs are not severe though they are not better since starting Dexamethasone. Right frontal HAs. Not nauseous or dizzy.  She reports emotional distress.  She reports that she is very spiritual.  She is enthusiastic about receiving referrals for counseling and spiritual support at our center.  She is from [place], she is divorced, two children; she  is a Teacher, [employer] and she served in the [employer]. She served in [place] and [place]. Returned in 2015 to [place].  April 2017 - diagnosed with breast cancer in [place]. 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  Pursued alternative therapies. 2018- Stage IV disease diagnosed. Sternal disease, innumerable liver lesions. Developed ~6cm  breast mass attached to chest wall. She was seen at [facility] Dec 2018. Declined tx there. Pursued low dose insulin-potentiated chemotherapy in [place]  w/ anti Her2 therapy. Then in remission. Seen at [facility] thereafter but declined tx there. Transferred to Dr. Marin Olp of [place] to resume Herception based treatments (2019). Jan 2020 - left anterior chest wall recurrence developed. Resected by Dr Dalbert Batman. Continued systemic therapy with Dr. Marin Olp.   Progressive disease on chest wall, resected with + margins at [facility] Dr Alvan Dame Jan 2021. Declined Radiotherapy. Dec 2021 - new HA's developed. MRI / PET - left chest wall progression. These lesions are scabbed over.   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    PAST SURGICAL HISTORY: Past Surgical History:  Procedure Laterality Date  . BREAST BIOPSY Left 01/2016  . CESAREAN SECTION  2012  . HERNIA REPAIR     ? Inguinal, but patient unsure.  Age 45  . MASS EXCISION Left 11/24/2018   Procedure: EXCISION NODULE LEFT CHEST WALL ERAS PATHWAY;  Surgeon: Fanny Skates, MD;  Location: [facility];  Service: General;  Laterality: Left;  . SENTINEL NODE BIOPSY Left 2017 APRIL    FAMILY HISTORY: family history includes Aortic aneurysm in her mother; Asthma in her brother and sister; Breast cancer in her maternal aunt and sister; Dementia in her father; Diabetes in her maternal grandmother; Eczema in her daughter; Hypertension in her mother; Leukemia in her maternal  grandmother; Prostate cancer in her brother and maternal grandfather.  SOCIAL HISTORY:  reports that she has never smoked. She has never used smokeless tobacco. She reports that she does not drink alcohol and does not use drugs.  ALLERGIES: Aspirin and Sulfa antibiotics  MEDICATIONS:  Current Outpatient Medications  Medication Sig Dispense Refill  . dexamethasone (DECADRON) 4 MG tablet Take 1 tablet (4 mg total) by mouth 2 (two) times daily. 40 tablet 1  . escitalopram (LEXAPRO) 5 MG tablet Take 1 tablet (5 mg total) by mouth daily. 30 tablet 3  . NON FORMULARY 2 (two) times daily. Pancreatic enzymes 3 caps AC and 2 caps with meals.    Marland Kitchen EPINEPHrine 0.3 mg/0.3 mL IJ SOAJ injection Inject 0.3 mg into the muscle as needed for anaphylaxis.  (Patient not taking: No sig reported)  0  . lidocaine-prilocaine (EMLA) cream Apply 1 application topically as needed. (Patient not taking: Reported on 11/14/2020) 30 g 2  . Menaquinone-7 (VITAMIN K2 PO) Take 15 drops by mouth 3 (three) times daily. (Patient not taking: Reported on 11/14/2020)    . NALTREXONE HCL PO Take 4.5 mg by mouth See admin instructions. Take 4.5 mg by mouth at night Wen through Sat (Patient not taking: Reported on 11/14/2020)    . PRESCRIPTION MEDICATION Inject 1 mL into the skin every Monday, Wednesday, and Friday. (Patient not taking: No sig reported)     No current facility-administered medications for this encounter.  REVIEW OF SYSTEMS:  As above.   PHYSICAL EXAM:  vitals were not taken for this visit.   General: Alert and oriented, in no acute distress  HEENT: No obvious facial droop or asymmetries during conversation over video conference; grossly her extraocular movements are intact Psychiatric: She is pleasant to speak with. Affect is appropriate.   LABORATORY DATA:  Lab Results  Component Value Date   WBC 3.4 (L) 11/08/2020   HGB 10.7 (L) 11/08/2020   HCT 33.8 (L) 11/08/2020   MCV 89.9 11/08/2020   PLT 207  11/08/2020   CMP     Component Value Date/Time   NA 140 11/08/2020 0824   K 3.9 11/08/2020 0824   CL 104 11/08/2020 0824   CO2 28 11/08/2020 0824   GLUCOSE 73 11/08/2020 0824   BUN 18 11/08/2020 0824   CREATININE 0.91 11/08/2020 0824   CALCIUM 9.3 11/08/2020 0824   PROT 6.3 (L) 11/08/2020 0824   ALBUMIN 4.0 11/08/2020 0824   AST 18 11/08/2020 0824   ALT 12 11/08/2020 0824   ALKPHOS 39 11/08/2020 0824   BILITOT 0.3 11/08/2020 0824   GFRNONAA >60 11/08/2020 0824   GFRAA >60 01/13/2020 0905         RADIOGRAPHY: MR Brain W Wo Contrast  Result Date: 11/09/2020 CLINICAL DATA:  New onset headaches. History of metastatic breast cancer. EXAM: MRI HEAD WITHOUT AND WITH CONTRAST TECHNIQUE: Multiplanar, multiecho pulse sequences of the brain and surrounding structures were obtained without and with intravenous contrast. CONTRAST:  41m GADAVIST GADOBUTROL 1 MMOL/ML IV SOLN COMPARISON:  None. FINDINGS: Brain: There are a total of 3 enhancing cerebellar lesions which measure 1.3 cm and 1.1 cm on the right and 0.7 cm on the left. There is moderate vasogenic edema associated with the largest lesion and mild edema associated with the other 2 lesions without significant posterior fossa mass effect. No enhancing supratentorial lesions are identified. The ventricles are normal in size. No acute infarct, intracranial hemorrhage, midline shift, or extra-axial fluid collection is identified. No significant cerebral white matter disease is evident. Vascular: Major intracranial vascular flow voids are preserved. Skull and upper cervical spine: Unremarkable bone marrow signal. Sinuses/Orbits: Unremarkable orbits. Paranasal sinuses and mastoid air cells are clear. Other: None. IMPRESSION: 1. Three enhancing cerebellar lesions consistent with metastases. Associated mild-to-moderate edema without posterior fossa mass effect. 2. Unremarkable appearance of the supratentorial brain. These results will be called to the  ordering clinician or representative by the Radiologist Assistant, and communication documented in the PACS or [employer] Electronically Signed   By: ALogan BoresM.D.   On: 11/09/2020 11:17   NM PET Image Restag (PS) Skull Base To Thigh  Result Date: 11/09/2020 CLINICAL DATA:  Subsequent treatment strategy for breast cancer. EXAM: NUCLEAR MEDICINE PET SKULL BASE TO THIGH TECHNIQUE: 7.1 mCi F-18 FDG was injected intravenously. Full-ring PET imaging was performed from the skull base to thigh after the radiotracer. CT data was obtained and used for attenuation correction and anatomic localization. Fasting blood glucose: 47 mg/dl COMPARISON:  12/30/2019 FINDINGS: Mediastinal blood pool activity: SUV max 2.3 Liver activity: SUV max NA NECK: No hypermetabolic lymph nodes in the neck. Incidental CT findings: none CHEST: The 2 foci of hypermetabolism are again identified in the left anterior chest wall. These appear similar to prior but progressive. 12 mm nodular lesion now identified anterior left chest wall (image 86/4) demonstrating SUV max = 5.1. The other nodular area of soft tissue is adjacent to in medial to an  apparent wound/scar in the anterior left chest wall (image 89/4) with SUV max = 5.8. There is a new third focus of hypermetabolism in the anterior left chest wall on today's study mapping to image 77 of series 4 which is along the superomedial aspect of the treatment bed. This focal hypermetabolism demonstrates SUV max = 2.6. Interval development of the fourth focus of hypermetabolism in the anterior left chest wall corresponding to a 5 mm nodule along the left aspect of the treatment site (89/4). SUV max = 5.4. No hypermetabolic lymphadenopathy in the chest. Incidental CT findings: Right Port-A-Cath tip is positioned in the mid right atrium. No suspicious pulmonary nodule or mass. No effusion. ABDOMEN/PELVIS: No abnormal hypermetabolic activity within the liver, pancreas, adrenal glands, or spleen.  No hypermetabolic lymph nodes in the abdomen or pelvis. Incidental CT findings: High attenuation material in the bladder lumen compatible with excreted gadolinium from MRI earlier today. SKELETON: No focal hypermetabolic activity to suggest skeletal metastasis. Incidental CT findings: none IMPRESSION: The 2 small foci of hypermetabolic activity identified previously in the anterior left chest wall persist with interval development of 2 additional similar hypermetabolic foci. All 4 are along the periphery of the apparent treatment site of the left breast. Although no soft tissue nodules were evident at these locations on the previous PET-CT, there does appear to be nodular soft tissue at the sites of hypermetabolism on today's exam. As such, imaging features are highly suspicious for recurrent/metastatic disease. No evidence for unexpected hypermetabolic disease in the neck, abdomen, or pelvis. The Electronically Signed   By: Misty Stanley M.D.   On: 11/09/2020 15:13   ECHOCARDIOGRAM COMPLETE  Result Date: 11/06/2020    ECHOCARDIOGRAM REPORT   Patient Name:   Taylor Flynn Date of Exam: 11/06/2020 Medical Rec #:  [id number]    Height:       63.5 in Accession #:    [id number]   Weight:       125.8 lb Date of Birth:  [birth year]     BSA:          1.597 m Patient Age:    43 years     BP:           107/66 mmHg Patient Gender: F            HR:           62 bpm. Exam Location:  Outpatient Procedure: 2D Echo, 3D Echo, Cardiac Doppler, Color Doppler and Strain Analysis Indications:    Z51.11 Encounter for antineoplastic chemotheraphy  History:        Patient has prior history of Echocardiogram examinations, most                 recent 01/17/2020. Arrythmias:non-specific ST changes. Breast                 cancer.  Sonographer:    Roseanna Rainbow RDCS Referring Phys: [place] Comments: Global longitudinal strain was attempted. IMPRESSIONS  1. Left ventricular ejection fraction, by estimation, is 65 to 70%. The  left ventricle has normal function. The left ventricle has no regional wall motion abnormalities. Left ventricular diastolic parameters were normal. The average left ventricular global longitudinal strain is -22.6 %. The global longitudinal strain is normal.  2. Right ventricular systolic function is normal. The right ventricular size is normal.  3. The mitral valve is normal in structure. Trivial mitral valve regurgitation. No evidence of mitral stenosis.  4. The aortic  valve is tricuspid. Aortic valve regurgitation is not visualized. No aortic stenosis is present.  5. The inferior vena cava is normal in size with <50% respiratory variability, suggesting right atrial pressure of 8 mmHg. Comparison(s): No significant change from prior study. Conclusion(s)/Recommendation(s): Normal biventricular function without evidence of hemodynamically significant valvular heart disease. FINDINGS  Left Ventricle: Left ventricular ejection fraction, by estimation, is 65 to 70%. The left ventricle has normal function. The left ventricle has no regional wall motion abnormalities. The average left ventricular global longitudinal strain is -22.6 %. The global longitudinal strain is normal. The left ventricular internal cavity size was normal in size. There is no left ventricular hypertrophy. Left ventricular diastolic parameters were normal. Right Ventricle: The right ventricular size is normal. No increase in right ventricular wall thickness. Right ventricular systolic function is normal. Left Atrium: Left atrial size was normal in size. Right Atrium: Right atrial size was normal in size. Pericardium: Trivial pericardial effusion is present. Mitral Valve: The mitral valve is normal in structure. Trivial mitral valve regurgitation. No evidence of mitral valve stenosis. Tricuspid Valve: The tricuspid valve is normal in structure. Tricuspid valve regurgitation is trivial. No evidence of tricuspid stenosis. Aortic Valve: The aortic valve  is tricuspid. Aortic valve regurgitation is not visualized. No aortic stenosis is present. Pulmonic Valve: The pulmonic valve was not well visualized. Pulmonic valve regurgitation is not visualized. No evidence of pulmonic stenosis. Aorta: The aortic root, ascending aorta, aortic arch and descending aorta are all structurally normal, with no evidence of dilitation or obstruction. Venous: The inferior vena cava is normal in size with less than 50% respiratory variability, suggesting right atrial pressure of 8 mmHg. IAS/Shunts: The atrial septum is grossly normal.  LEFT VENTRICLE PLAX 2D LVIDd:         3.70 cm     Diastology LVIDs:         2.10 cm     LV e' medial:    9.57 cm/s LV PW:         1.10 cm     LV E/e' medial:  8.5 LV IVS:        1.00 cm     LV e' lateral:   19.10 cm/s LVOT diam:     1.50 cm     LV E/e' lateral: 4.3 LV SV:         44 LV SV Index:   28          2D Longitudinal Strain LVOT Area:     1.77 cm    2D Strain GLS (A2C):   -26.2 %                            2D Strain GLS (A3C):   -22.1 %                            2D Strain GLS (A4C):   -19.6 % LV Volumes (MOD)           2D Strain GLS Avg:     -22.6 % LV vol d, MOD A2C: 55.9 ml LV vol d, MOD A4C: 63.3 ml LV vol s, MOD A2C: 16.2 ml LV vol s, MOD A4C: 24.5 ml LV SV MOD A2C:     39.7 ml LV SV MOD A4C:     63.3 ml LV SV MOD BP:      38.5 ml RIGHT VENTRICLE  IVC RV S prime:     13.80 cm/s  IVC diam: 1.80 cm TAPSE (M-mode): 2.3 cm LEFT ATRIUM             Index       RIGHT ATRIUM           Index LA diam:        2.20 cm 1.38 cm/m  RA Area:     13.40 cm LA Vol (A2C):   22.5 ml 14.09 ml/m RA Volume:   32.20 ml  20.16 ml/m LA Vol (A4C):   19.3 ml 12.08 ml/m LA Biplane Vol: 21.1 ml 13.21 ml/m  AORTIC VALVE LVOT Vmax:   126.00 cm/s LVOT Vmean:  80.500 cm/s LVOT VTI:    0.251 m  AORTA Ao Root diam: 2.90 cm Ao Asc diam:  2.20 cm MITRAL VALVE MV Area (PHT): 3.65 cm    SHUNTS MV Decel Time: 208 msec    Systemic VTI:  0.25 m MV E velocity: 81.40  cm/s  Systemic Diam: 1.50 cm MV A velocity: 46.70 cm/s MV E/A ratio:  1.74 Buford Dresser MD Electronically signed by Buford Dresser MD Signature Date/Time: 11/06/2020/12:45:27 PM    Final       IMPRESSION/PLAN: This is a wonderful 45 year old female with metastatic disease to the brain.  I had a lengthy discussion with her after reviewing her MRI results with them.  We spoke about whole brain radiotherapy versus stereotactic radiosurgery to the brain. We spoke about the differing risks benefits and side effects of both of these treatments. During part of our discussion, we spoke about the hair loss, fatigue and cognitive effects that can result from whole brain radiotherapy.  Additionally, we spoke about radionecrosis that can result from stereotactic radiosurgery. I explained that whole brain radiotherapy is more comprehensive and therefore can decrease the chance of recurrences elsewhere in the brain, while stereotactic radiosurgery only treats the areas of gross disease while sparing the rest of the brain parenchyma.  I recommend that she undergo a 3 Tesla MRI for treatment planning so that we can reliably target all of the visible lesions.  I recommend that she undergo stereotactic radiosurgery as I believe this would have a much more favorable side effect profile than whole brain radiation therapy, and is more appropriate given the limited number of lesions.  After lengthy discussion, she would like to proceed with stereotactic brain radiosurgery to their metastatic disease. They will meet with neurosurgery in the near future to discuss this further; a neurosurgeon will participate in their case.  CT simulation will take place on January 14 after her 3 Tesla MRI is complete and treatment will take place next week.  I plan to deliver 20 Gy in 1 fraction to each lesion.  I talked to her about social work and spiritual care referrals.  She is interested in counseling and additional layers  of emotional support.  She is enthusiastic about these referrals and I will facilitate them.  We discussed measures to reduce the risk of infection during the COVID-19 pandemic.  I highly recommended that she get vaccinated.  We talked about the risks of the virus and the relatively small risks of the vaccine.  At this time she does not wish to get vaccinated but she knows to contact us if she changes her mind.  We are offering vaccines at the cancer center.  Addendum: The day after consultation I was informed that she canceled her 3 Tesla  MRI and  is obtaining a second opinion to explore gamma knife radiosurgery.  We will cancel her simulation and our navigator will get back in touch with her to see what her disposition is whether she wants to reschedule here.  This encounter was provided by telemedicine platform; patient desired telemedicine during pandemic precautions.  MyChart video was used. The patient has given verbal consent for this type of encounter and has been advised to only accept a meeting of this type in a secure network environment. On date of service, in total, I spent 60 minutes on this encounter.   The attendants for this meeting include Eppie Gibson  and Marice Potter During the encounter, Eppie Gibson was located at [facility] Radiation Oncology Department.  Toryn Dewalt was located at home.   __________________________________________   Eppie Gibson, MD  This document serves as a record of services personally performed by Eppie Gibson, MD. It was created on his behalf by Clerance Lav, a trained medical scribe. The creation of this record is based on the scribe's personal observations and the provider's statements to them. This document has been checked and approved by the attending provider.

## 2020-11-13 NOTE — Progress Notes (Signed)
Histology and Location of Primary Cancer:  Malignant neoplasm of overlapping sites of LEFT breast, estrogen receptor negative  Sites of Visceral and Bony Metastatic Disease:  MRI Brain  --11/09/2020 IMPRESSION: 1. Three enhancing cerebellar lesions consistent with metastases. Associated mild-to-moderate edema without posterior fossa mass effect. 2. Unremarkable appearance of the supratentorial brain  Past/Anticipated chemotherapy by medical oncology, if any:  Under care of Dr. Burney Gauze 11/09/2020 --She has been on anti-HER-2 therapy. --Her tumor markers will also give [place] an idea as to whether or not she has progressive disease --I would think that since she is not had treatment in 9 months that we can get her back onto Herceptin/Perjeta.  I think this would be reasonable.  Another option would be the use of Enhertu.  This also has been shown to be quite effective in metastatic disease for patients who are HER-2 positive  Pain on a scale of 0-10 is: Chronic headaches; rates 3-4 out of 10   Ambulatory status? Walker? Wheelchair?: Patient is ambulatory without assistance   Dose of Decadron, if applicable: 4 mg PO twice a day (started 11/09/2020)  Recent neurologic symptoms, if any:   Seizures: Patient denies   Headaches: 3-4 out of 10, intermittent but chronic. Located mainly right frontal (above orbit) to right parietal   Nausea: Patient denies  Dizziness/ataxia: Patient denies, but occasionally feels off balance if she turns too quickly (maybe related to uneven leg lengths--reports it doesn't happen is she wears her heel lift)  Difficulty with hand coordination: Patient denies  Focal numbness/weakness: Typically no, but reports that this AM reports her right leg took awhile to "wake up" while she was meditating  Visual deficits/changes: Not currently, but recently got new glasses  Confusion/Memory deficits: Patient denies   SAFETY ISSUES:  Prior radiation?  No  Pacemaker/ICD? No  Possible current pregnancy? No--postmenopausal due to medications  Is the patient on methotrexate? No (was taking for a brief period early on in breast cancer treatment. Reports she's been off for ~18 months off)  Additional Complaints / other details:  Patient has not received COVID vaccines or annual flu shot

## 2020-11-13 NOTE — Progress Notes (Signed)
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

## 2020-11-14 ENCOUNTER — Other Ambulatory Visit: Payer: Self-pay

## 2020-11-14 ENCOUNTER — Ambulatory Visit
Admission: RE | Admit: 2020-11-14 | Discharge: 2020-11-14 | Disposition: A | Discharge status: Home / Self Care | Source: Ambulatory Visit | Attending: Radiation Oncology | Admitting: Radiation Oncology

## 2020-11-14 ENCOUNTER — Encounter: Payer: Self-pay | Admitting: Radiation Oncology

## 2020-11-14 ENCOUNTER — Ambulatory Visit: Admitting: Radiation Oncology

## 2020-11-14 DIAGNOSIS — C7949 Secondary malignant neoplasm of other parts of nervous system: Secondary | ICD-10-CM

## 2020-11-14 DIAGNOSIS — C7931 Secondary malignant neoplasm of brain: Secondary | ICD-10-CM

## 2020-11-14 DIAGNOSIS — C50919 Malignant neoplasm of unspecified site of unspecified female breast: Secondary | ICD-10-CM

## 2020-11-15 ENCOUNTER — Ambulatory Visit

## 2020-11-15 ENCOUNTER — Encounter: Payer: Self-pay | Admitting: General Practice

## 2020-11-15 ENCOUNTER — Other Ambulatory Visit

## 2020-11-15 ENCOUNTER — Encounter: Payer: Self-pay | Admitting: Radiation Oncology

## 2020-11-15 NOTE — Progress Notes (Signed)
[place] Spiritual Care Note  Sparkle Aube by phone per referral from Dr Isidore Moos for spiritual/emotional support. Jana Half and I set up a Spiritual Care Virtual Visit for tomorrow at 2:30pm.   Pauline Good, [facility] Pager [phone] Voicemail [phone]

## 2020-11-16 ENCOUNTER — Inpatient Hospital Stay: Admission: RE | Admit: 2020-11-16 | Source: Ambulatory Visit

## 2020-11-16 ENCOUNTER — Telehealth: Payer: Self-pay | Admitting: Radiation Therapy

## 2020-11-16 NOTE — Telephone Encounter (Signed)
Taylor Flynn when I noticed the brain MRI we had scheduled for her [facility] treatment planning was cancelled. She said that she is seeking a second opinion for [facility] before moving forward. I will call her back on Friday 1/14 to see when that appointment is scheduled so we can make the appropriate adjustments to her schedule.   Mont Dutton R.T.(R)(T) Radiation Special Procedures Navigator

## 2020-11-17 ENCOUNTER — Ambulatory Visit

## 2020-11-17 ENCOUNTER — Telehealth: Payer: Self-pay | Admitting: Radiation Therapy

## 2020-11-17 ENCOUNTER — Other Ambulatory Visit

## 2020-11-17 ENCOUNTER — Ambulatory Visit: Admitting: Radiation Oncology

## 2020-11-17 ENCOUNTER — Encounter: Payer: Self-pay | Admitting: General Practice

## 2020-11-17 NOTE — Telephone Encounter (Signed)
Called Ms. Reining back to see if she had the second opinion scheduled for [facility]. She has an appointment with [facility] on Friday 1/21, but is also working on another, expecting a call back from them early Monday 1/17. Ms. Collymore asked me to call her back on Monday to see if she has everything sorted out for treatment, and was thankful for my call to check in.   Mont Dutton R.T.(R)(T) Radiation Special Procedures Navigator

## 2020-11-17 NOTE — Progress Notes (Signed)
[place] Spiritual Care Note  Met with Jana Half by Jackquline Denmark video visit yesterday, providing empathic listening as she shared about her cancer story and other significant life-review events/relationships. We plan to follow up in a second video visit for more processing of these stories. I have emailed her potential meeting times per her request and await a reply.   [place], [place], [facility] Pager [phone] Voicemail [phone]

## 2020-11-22 ENCOUNTER — Ambulatory Visit: Admitting: Radiation Oncology

## 2020-11-23 ENCOUNTER — Other Ambulatory Visit: Payer: Self-pay | Admitting: *Deleted

## 2020-11-23 DIAGNOSIS — C50919 Malignant neoplasm of unspecified site of unspecified female breast: Secondary | ICD-10-CM

## 2020-11-24 ENCOUNTER — Inpatient Hospital Stay: Payer: Medicare Other

## 2020-11-24 ENCOUNTER — Other Ambulatory Visit: Payer: Self-pay

## 2020-11-24 VITALS — BP 109/71 | HR 58 | Temp 98.1°F | Resp 16

## 2020-11-24 DIAGNOSIS — C50919 Malignant neoplasm of unspecified site of unspecified female breast: Secondary | ICD-10-CM

## 2020-11-24 DIAGNOSIS — Z5112 Encounter for antineoplastic immunotherapy: Secondary | ICD-10-CM | POA: Diagnosis not present

## 2020-11-24 DIAGNOSIS — C50812 Malignant neoplasm of overlapping sites of left female breast: Secondary | ICD-10-CM

## 2020-11-24 DIAGNOSIS — Z171 Estrogen receptor negative status [ER-]: Secondary | ICD-10-CM

## 2020-11-24 DIAGNOSIS — C50912 Malignant neoplasm of unspecified site of left female breast: Secondary | ICD-10-CM

## 2020-11-24 LAB — COMPREHENSIVE METABOLIC PANEL
ALT: 9 U/L (ref 0–44)
AST: 17 U/L (ref 15–41)
Albumin: 4.3 g/dL (ref 3.5–5.0)
Alkaline Phosphatase: 39 U/L (ref 38–126)
Anion gap: 12 (ref 5–15)
BUN: 16 mg/dL (ref 6–20)
CO2: 24 mmol/L (ref 22–32)
Calcium: 9.5 mg/dL (ref 8.9–10.3)
Chloride: 104 mmol/L (ref 98–111)
Creatinine, Ser: 0.86 mg/dL (ref 0.44–1.00)
GFR, Estimated: >60 mL/min (ref >60)
Glucose, Bld: 58 mg/dL — ABNORMAL LOW (ref 70–99)
Potassium: 3.8 mmol/L (ref 3.5–5.1)
Sodium: 140 mmol/L (ref 135–145)
Total Bilirubin: 0.4 mg/dL (ref 0.3–1.2)
Total Protein: 6.6 g/dL (ref 6.5–8.1)

## 2020-11-24 LAB — CBC WITH DIFFERENTIAL (CANCER CENTER ONLY)
Abs Immature Granulocytes: 0.01 10*3/uL (ref 0.00–0.07)
Basophils Absolute: 0 10*3/uL (ref 0.0–0.1)
Basophils Relative: 1 %
Eosinophils Absolute: 0.1 10*3/uL (ref 0.0–0.5)
Eosinophils Relative: 2 %
HCT: 33.4 % — ABNORMAL LOW (ref 36.0–46.0)
Hemoglobin: 10.7 g/dL — ABNORMAL LOW (ref 12.0–15.0)
Immature Granulocytes: 0 %
Lymphocytes Relative: 33 %
Lymphs Abs: 1.3 10*3/uL (ref 0.7–4.0)
MCH: 28.4 pg (ref 26.0–34.0)
MCHC: 32 g/dL (ref 30.0–36.0)
MCV: 88.6 fL (ref 80.0–100.0)
Monocytes Absolute: 0.4 10*3/uL (ref 0.1–1.0)
Monocytes Relative: 10 %
Neutro Abs: 2 10*3/uL (ref 1.7–7.7)
Neutrophils Relative %: 54 %
Platelet Count: 209 10*3/uL (ref 150–400)
RBC: 3.77 MIL/uL — ABNORMAL LOW (ref 3.87–5.11)
RDW: 15.5 % (ref 11.5–15.5)
WBC Count: 3.8 10*3/uL — ABNORMAL LOW (ref 4.0–10.5)
nRBC: 0 % (ref 0.0–0.2)

## 2020-11-24 MED ORDER — HEPARIN SOD (PORK) LOCK FLUSH 100 UNIT/ML IV SOLN
500.0000 [IU] | Freq: Once | INTRAVENOUS | Status: AC | PRN
  Administered 2020-11-24: 500 [IU]
  Filled 2020-11-24: qty 5

## 2020-11-24 MED ORDER — ZOLEDRONIC ACID 4 MG/100ML IV SOLN
4.0000 mg | Freq: Once | INTRAVENOUS | Status: AC
  Administered 2020-11-24: 4 mg via INTRAVENOUS
  Filled 2020-11-24: qty 100

## 2020-11-24 MED ORDER — DIPHENHYDRAMINE HCL 25 MG PO CAPS: 50.0000 mg | ORAL_CAPSULE | Freq: Once | ORAL | Status: DC

## 2020-11-24 MED ORDER — SODIUM CHLORIDE 0.9 % IV SOLN
Freq: Once | INTRAVENOUS | Status: AC
Start: 2020-11-24 — End: 2020-11-24
  Filled 2020-11-24: qty 250

## 2020-11-24 MED ORDER — SODIUM CHLORIDE 0.9% FLUSH
10.0000 mL | INTRAVENOUS | Status: DC | PRN
  Administered 2020-11-24: 10 mL
  Filled 2020-11-24: qty 10

## 2020-11-24 MED ORDER — SODIUM CHLORIDE 0.9 % IV SOLN
3.6000 mg/kg | Freq: Once | INTRAVENOUS | Status: AC
  Administered 2020-11-24: 200 mg via INTRAVENOUS
  Filled 2020-11-24: qty 10

## 2020-11-24 MED ORDER — ZOLEDRONIC ACID 4 MG/100ML IV SOLN: 4.0000 mg | Freq: Once | INTRAVENOUS | Status: DC

## 2020-11-24 MED ORDER — ACETAMINOPHEN 325 MG PO TABS: 650.0000 mg | ORAL_TABLET | Freq: Once | ORAL | Status: DC

## 2020-11-24 NOTE — Progress Notes (Signed)
Dr. Marin Olp and patient notified of glucose-58.  No new orders received at this time.  Pt states this is "normal" for her, that she is fasting at this time and does not want to eat anything now.  Dr. Marin Olp aware.

## 2020-11-24 NOTE — Patient Instructions (Signed)

## 2020-11-24 NOTE — Progress Notes (Signed)
Pt refusing to take Tylenol and Benadryl as pre meds for [place].  Dr. Marin Olp notified.

## 2020-11-24 NOTE — Patient Instructions (Signed)
[facility] Discharge Instructions for Patients Receiving Chemotherapy  Today you received the following chemotherapy agents:  Kadcyla  To help prevent nausea and vomiting after your treatment, we encourage you to take your nausea medication as ordered per MD.    If you develop nausea and vomiting that is not controlled by your nausea medication, call the clinic.   BELOW ARE SYMPTOMS THAT SHOULD BE REPORTED IMMEDIATELY:  *FEVER GREATER THAN 100.5 F  *CHILLS WITH OR WITHOUT FEVER  NAUSEA AND VOMITING THAT IS NOT CONTROLLED WITH YOUR NAUSEA MEDICATION  *UNUSUAL SHORTNESS OF BREATH  *UNUSUAL BRUISING OR BLEEDING  TENDERNESS IN MOUTH AND THROAT WITH OR WITHOUT PRESENCE OF ULCERS  *URINARY PROBLEMS  *BOWEL PROBLEMS  UNUSUAL RASH Items with * indicate a potential emergency and should be followed up as soon as possible.  Feel free to call the clinic should you have any questions or concerns. The clinic phone number is [phone].  Please show the CHEMO ALERT CARD at check-in to the Emergency Department and triage nurse.   

## 2020-11-25 LAB — CANCER ANTIGEN 19-9: CA 19-9: 44 U/mL — ABNORMAL HIGH (ref 0–35)

## 2020-11-25 LAB — CANCER ANTIGEN 27.29: CA 27.29: 19.8 U/mL (ref 0.0–38.6)

## 2020-11-29 ENCOUNTER — Telehealth: Payer: Self-pay | Admitting: Radiation Therapy

## 2020-11-29 ENCOUNTER — Ambulatory Visit

## 2020-11-29 ENCOUNTER — Other Ambulatory Visit

## 2020-11-29 ENCOUNTER — Ambulatory Visit: Admitting: Hematology & Oncology

## 2020-11-29 NOTE — Telephone Encounter (Signed)
Called to check in with the patient and her plans for SRS at an outside facility. She said that this was actually done yesterday, 11/28/20, in [place]. She was treated with [facility], reports everything went very smoothly. She has her return flight  back home today. She has already requested her outside records and treatment plan be sent to Dr. Marin Olp and plans to continue systemic treatment and follow-up with him. Overall she was very happy with this process and thankful for the call back to check in with her status.   Mont Dutton R.T.(R)(T) Radiation Special Procedures Navigator

## 2020-12-08 ENCOUNTER — Ambulatory Visit: Admitting: Hematology & Oncology

## 2020-12-08 ENCOUNTER — Ambulatory Visit

## 2020-12-08 ENCOUNTER — Other Ambulatory Visit

## 2020-12-14 ENCOUNTER — Inpatient Hospital Stay

## 2020-12-14 ENCOUNTER — Inpatient Hospital Stay: Attending: Hematology & Oncology

## 2020-12-14 ENCOUNTER — Inpatient Hospital Stay: Admitting: Hematology & Oncology

## 2020-12-19 ENCOUNTER — Other Ambulatory Visit

## 2020-12-29 ENCOUNTER — Inpatient Hospital Stay

## 2020-12-29 ENCOUNTER — Telehealth: Payer: Self-pay | Admitting: *Deleted

## 2020-12-29 ENCOUNTER — Inpatient Hospital Stay: Admitting: Hematology & Oncology

## 2020-12-29 NOTE — Telephone Encounter (Signed)
Call placed to patient to check on her status after not showing up for today's scheduled appts. Pt states that she has had a recent gamma knife procedure in [place], [place] and has a phone f/u visit with them today.  She states that she does not want to take Kadcyla any more d/t it caused her to have intense headaches the day after receiving it. Pt states that she does not want any further treatment with Kadcyla.  Pt also states that she has started her ketogenic diet back, is taking [place], does not want to reschedule her appt with Dr. Marin Olp at this time and will contact this office when she is ready to see Dr. Marin Olp again. Dr. Marin Olp notified.

## 2021-01-11 ENCOUNTER — Other Ambulatory Visit (HOSPITAL_COMMUNITY): Payer: Self-pay | Admitting: Neurosurgery

## 2021-01-11 ENCOUNTER — Ambulatory Visit (INDEPENDENT_AMBULATORY_CARE_PROVIDER_SITE_OTHER): Payer: Medicare Other | Admitting: Family Medicine

## 2021-01-11 ENCOUNTER — Encounter: Payer: Self-pay | Admitting: Family Medicine

## 2021-01-11 ENCOUNTER — Other Ambulatory Visit: Payer: Self-pay

## 2021-01-11 VITALS — BP 108/60 | HR 71 | Temp 97.8°F | Resp 18 | Ht 63.11 in | Wt 122.2 lb

## 2021-01-11 DIAGNOSIS — Z87898 Personal history of other specified conditions: Secondary | ICD-10-CM | POA: Diagnosis not present

## 2021-01-11 DIAGNOSIS — J3089 Other allergic rhinitis: Secondary | ICD-10-CM

## 2021-01-11 DIAGNOSIS — R519 Headache, unspecified: Secondary | ICD-10-CM

## 2021-01-11 DIAGNOSIS — C7931 Secondary malignant neoplasm of brain: Secondary | ICD-10-CM

## 2021-01-11 NOTE — Progress Notes (Signed)
[place] [address] Dept: [phone]  FOLLOW UP NOTE  Patient ID: Taylor Flynn, female    DOB: [birth year]  Age: 45 y.o. MRN: [id number] Date of Office Visit: 01/11/2021  Assessment  Chief Complaint: Allergy Testing  HPI Taylor Flynn is a 45 year old female who presents to the clinic for follow-up visit.  She was last seen in this clinic on 09/25/2018 for an oral aspirin graded challenge and evaluation of angioedema.  She does have a history of breast cancer.  At that time, HAE panel was negative.  At today's visit, she reports that she has not had any flares of angioedema for over 1 year.  She does report that she has gotten a new dog about 3 weeks ago.  Her primary care provider, upon regular physical evaluation, noted that she may need [facility] allergy evaluation due to turbinates swelling and postnasal drainage.  Sanda does report occasional postnasal drainage, however, she denies nasal congestion, and nasal drainage, and sneezing.  At the time she acquired the new dog, she did report frequent and eye itching, however, this has resolved at this time.  Her current medications are listed in the chart.   Drug Allergies:  Allergies  Allergen Reactions   Aspirin Swelling    Angioedema    Sulfa Antibiotics Rash    Physical Exam: BP 108/60 (BP Location: Right Arm, Patient Position: Sitting, Cuff Size: Normal)    Pulse 71    Temp 97.8 F (36.6 C) (Temporal)    Resp 18    Ht 5' 3.11" (1.603 m)    Wt 122 lb 3.2 oz (55.4 kg)    LMP 12/07/2018    SpO2 98%    BMI 21.57 kg/m    Physical Exam Vitals reviewed.  Constitutional:      Appearance: Normal appearance.  HENT:     Head: Normocephalic and atraumatic.     Right Ear: Tympanic membrane normal.     Left Ear: Tympanic membrane normal.     Mouth/Throat:     Comments: Bilateral nares edematous and pale with clear nasal drainage noted.  Pharynx with cobblestoning and thick clear drainage noted.  Ears normal.  Eyes  normal. Eyes:     Conjunctiva/sclera: Conjunctivae normal.  Cardiovascular:     Rate and Rhythm: Normal rate and regular rhythm.     Heart sounds: No murmur heard.   Pulmonary:     Effort: Pulmonary effort is normal.     Breath sounds: Normal breath sounds.     Comments: Lungs clear to auscultation Musculoskeletal:        General: Normal range of motion.     Cervical back: Normal range of motion and neck supple.  Skin:    General: Skin is warm and dry.  Neurological:     Mental Status: She is alert and oriented to person, place, and time.  Psychiatric:        Mood and Affect: Mood normal.        Behavior: Behavior normal.        Thought Content: Thought content normal.        Judgment: Judgment normal.     Diagnostics: Percutaneous environmental panel was positive to alternaria alternaria and borderline to aspergillus mix with adequate controls.   Assessment and Plan: 1. History of angioedema   2. Allergic rhinitis due to fungal spores, unspecified seasonality     Patient Instructions  Allergic rhinitis Your skin testing was positive to mold. Allergen avoidance measures are  listed below Continue Claritin 10 mg once a day as needed for a runny nose ir itch You may use Flonase 1-2 sprays in each nostril once a day as needed for a stuffy nose Consider saline nasal rinses as needed for nasal symptoms. Use this before any medicated nasal sprays for best result Consider intradermal testing to dog and possibly some of the indoor allergens at a follow up visit.   Angioedema Continue Claritin 10 mg once a day as needed for itch or swelling If your symptoms re-occur, begin a journal of events that occurred for up to 6 hours before your symptoms began including foods and beverages consumed, soaps or perfumes you had contact with, and medications. Call the clinic if this treatment plan is not working well for you  Follow up in 3 months or sooner if needed.   Return in about 3  months (around 04/13/2021), or if symptoms worsen or fail to improve.    Thank you for the opportunity to care for this patient.  Please do not hesitate to contact me with questions.  Gareth Morgan, FNP [facility] [place]

## 2021-01-11 NOTE — Patient Instructions (Signed)
Allergic rhinitis Your skin testing was positive to mold. Allergen avoidance measures are listed below Continue Claritin 10 mg once a day as needed for a runny nose ir itch You may use Flonase 1-2 sprays in each nostril once a day as needed for a stuffy nose Consider saline nasal rinses as needed for nasal symptoms. Use this before any medicated nasal sprays for best result Consider intradermal testing to dog and possibly some of the indoor allergens at a follow up visit.   Angioedema Continue Claritin 10 mg once a day as needed for itch or swelling If your symptoms re-occur, begin a journal of events that occurred for up to 6 hours before your symptoms began including foods and beverages consumed, soaps or perfumes you had contact with, and medications. Call the clinic if this treatment plan is not working well for you  Follow up in 3 months or sooner if needed.  Control of Mold Allergen Mold and fungi can grow on a variety of surfaces provided certain temperature and moisture conditions exist.  Outdoor molds grow on plants, decaying vegetation and soil.  The major outdoor mold, Alternaria and Cladosporium, are found in very high numbers during hot and dry conditions.  Generally, a late Summer - Fall peak is seen for common outdoor fungal spores.  Rain will temporarily lower outdoor mold spore count, but counts rise rapidly when the rainy period ends.  The most important indoor molds are Aspergillus and Penicillium.  Dark, humid and poorly ventilated basements are ideal sites for mold growth.  The next most common sites of mold growth are the bathroom and the kitchen.  Outdoor [employer] 1. Use air conditioning and keep windows closed 2. Avoid exposure to decaying vegetation. 3. Avoid leaf raking. 4. Avoid grain handling. 5. Consider wearing a face mask if working in moldy areas.  Indoor Mold Control 1. Maintain humidity below 50%. 2. Clean washable surfaces with 5% bleach  solution. 3. Remove sources e.g. Contaminated carpets.

## 2021-01-17 ENCOUNTER — Ambulatory Visit: Admitting: Family Medicine

## 2021-01-17 NOTE — Progress Notes (Deleted)
   [place] [address] Dept: [phone]  FOLLOW UP NOTE  Patient ID: Taylor Flynn, female    DOB: [birth year]  Age: 45 y.o. MRN: [id number] Date of Office Visit: 01/17/2021  Assessment  Chief Complaint: No chief complaint on file.  HPI Marice Potter    Drug Allergies:  Allergies  Allergen Reactions  . Aspirin Swelling    Angioedema   . Sulfa Antibiotics Rash    Physical Exam: LMP 12/07/2018    Physical Exam  Diagnostics:    Assessment and Plan: No diagnosis found.  No orders of the defined types were placed in this encounter.   There are no Patient Instructions on file for this visit.  No follow-ups on file.    Thank you for the opportunity to care for this patient.  Please do not hesitate to contact me with questions.  Gareth Morgan, FNP [facility] [place]

## 2021-01-19 NOTE — Patient Instructions (Addendum)
Allergic rhinitis (mold) Your intradermal skin testing today was negative with a good histamine control You may use Claritin 10 mg once a day as needed for a runny nose ir itch You may use Flonase 1-2 sprays in each nostril once a day as needed for a stuffy nose Consider saline nasal rinses as needed for nasal symptoms. Use this before any medicated nasal sprays .   Angioedema You may use Claritin 10 mg once a day as needed for itch or swelling If your symptoms re-occur, begin a journal of events that occurred for up to 6 hours before your symptoms began including foods and beverages consumed, soaps or perfumes you had contact with, and medications.   Please let us know if this treatment plan is not working well for you  Schedule follow up appointment in 3 months or sooner if needed.  Control of Mold Allergen Mold and fungi can grow on a variety of surfaces provided certain temperature and moisture conditions exist.  Outdoor molds grow on plants, decaying vegetation and soil.  The major outdoor mold, Alternaria and Cladosporium, are found in very high numbers during hot and dry conditions.  Generally, a late Summer - Fall peak is seen for common outdoor fungal spores.  Rain will temporarily lower outdoor mold spore count, but counts rise rapidly when the rainy period ends.  The most important indoor molds are Aspergillus and Penicillium.  Dark, humid and poorly ventilated basements are ideal sites for mold growth.  The next most common sites of mold growth are the bathroom and the kitchen.  Outdoor [employer] 1. Use air conditioning and keep windows closed 2. Avoid exposure to decaying vegetation. 3. Avoid leaf raking. 4. Avoid grain handling. 5. Consider wearing a face mask if working in moldy areas.  Indoor Mold Control 1. Maintain humidity below 50%. 2. Clean washable surfaces with 5% bleach solution. 3. Remove sources e.g. Contaminated carpets.

## 2021-01-22 ENCOUNTER — Encounter: Payer: Self-pay | Admitting: Family

## 2021-01-22 ENCOUNTER — Ambulatory Visit (INDEPENDENT_AMBULATORY_CARE_PROVIDER_SITE_OTHER): Payer: Medicare Other | Admitting: Family

## 2021-01-22 ENCOUNTER — Other Ambulatory Visit: Payer: Self-pay

## 2021-01-22 VITALS — BP 102/78 | HR 86 | Temp 98.1°F | Resp 18 | Ht 63.0 in | Wt 120.6 lb

## 2021-01-22 DIAGNOSIS — Z87898 Personal history of other specified conditions: Secondary | ICD-10-CM | POA: Diagnosis not present

## 2021-01-22 DIAGNOSIS — J3089 Other allergic rhinitis: Secondary | ICD-10-CM | POA: Diagnosis not present

## 2021-01-22 NOTE — Progress Notes (Signed)
[place] [address] Dept: [phone]  FOLLOW UP NOTE  Patient ID: Taylor Flynn, female    DOB: 10-14-1976  Age: 45 y.o. MRN: [id number] Date of Office Visit: 01/22/2021  Assessment  Chief Complaint: Allergy Testing  HPI Taylor Flynn is a 45 year old female who presents today for intradermal skin testing.  She was last seen on January 11, 2021 by Gareth Morgan, FNP for allergic rhinitis and angioedema.  She does have a history of breast cancer.  Allergic rhinitis is reported as moderately controlled with no medication at this time.  She is currently not taking Claritin or using fluticasone nasal spray.  She reports occasional nasal congestion.  Her right nostril being worse than her left nostril.  She denies any postnasal drip and rhinorrhea.  She also denies any itchy watery eyes.  She is only interested in doing intradermal testing to the indoor environmental inhalants.  She reports that she does not have any problems when outdoors.  She has a new dog and wonders if it could be causing some of her symptoms.  Angioedema is reported as controlled with no medication at this time.  She reports that she has not had any more swelling since finding out that aspirin was the cause of her angioedema.   Drug Allergies:  Allergies  Allergen Reactions  . Aspirin Swelling    Angioedema   . Sulfa Antibiotics Rash    Review of Systems: Review of Systems  Constitutional: Negative for chills and fever.  HENT:       Reports nasal congestion at times-right nostril worse than left. Denies rhinorrhea and post nasal drip  Eyes:       Denies itchy watery eyes  Respiratory: Negative for cough, shortness of breath and wheezing.   Cardiovascular: Negative for chest pain and palpitations.  Gastrointestinal: Negative for abdominal pain and heartburn.  Genitourinary: Negative for dysuria.  Skin: Negative for itching and rash.  Neurological: Positive for headaches.       Reports  occasional headaches at times  Endo/Heme/Allergies: Positive for environmental allergies.    Physical Exam: BP 102/78 (BP Location: Right Arm, Patient Position: Sitting, Cuff Size: Normal)   Pulse 86   Temp 98.1 F (36.7 C) (Temporal)   Resp 18   Ht 5\' 3"  (1.6 m)   Wt 120 lb 9.6 oz (54.7 kg)   LMP 12/07/2018   SpO2 95%   BMI 21.36 kg/m    Physical Exam Constitutional:      Appearance: Normal appearance.  HENT:     Head: Normocephalic and atraumatic.     Comments: Pharynx with cobblestoning and thick clear drainage. Eyes normal. Ears normal. Nose: bilateral lower turbinate mildly edematous and pale with no drainage noted    Right Ear: Tympanic membrane, ear canal and external ear normal.     Left Ear: Tympanic membrane, ear canal and external ear normal.     Mouth/Throat:     Mouth: Mucous membranes are moist.     Pharynx: Oropharynx is clear.  Eyes:     Conjunctiva/sclera: Conjunctivae normal.  Cardiovascular:     Rate and Rhythm: Regular rhythm.     Heart sounds: Normal heart sounds.  Pulmonary:     Effort: Pulmonary effort is normal.     Breath sounds: Normal breath sounds.     Comments: Lungs clear to auscultation Musculoskeletal:     Cervical back: Neck supple.  Skin:    General: Skin is warm.  Neurological:  Mental Status: She is alert and oriented to person, place, and time.  Psychiatric:        Mood and Affect: Mood normal.        Behavior: Behavior normal.        Thought Content: Thought content normal.        Judgment: Judgment normal.     Diagnostics:  Intradermal skin testing today was negative with a good histamine control  Intradermal - 01/22/21 1412    Time Antigen Placed 1412    Allergen Manufacturer Lavella Hammock    Location Arm    Number of Test 6    Control 3+    Mold 3 Negative    Mold 4 Negative    Cat Negative    Dog Negative    Cockroach Negative    Mite mix Negative            Assessment and Plan: 1. Allergic rhinitis due to  fungal spores, unspecified seasonality   2. History of angioedema     No orders of the defined types were placed in this encounter.   Patient Instructions  Allergic rhinitis (mold) Your intradermal skin testing today was negative with a good histamine control You may use Claritin 10 mg once a day as needed for a runny nose ir itch You may use Flonase 1-2 sprays in each nostril once a day as needed for a stuffy nose Consider saline nasal rinses as needed for nasal symptoms. Use this before any medicated nasal sprays .   Angioedema You may use Claritin 10 mg once a day as needed for itch or swelling If your symptoms re-occur, begin a journal of events that occurred for up to 6 hours before your symptoms began including foods and beverages consumed, soaps or perfumes you had contact with, and medications.   Please let us know if this treatment plan is not working well for you  Schedule follow up appointment in 3 months or sooner if needed.  Control of Mold Allergen Mold and fungi can grow on a variety of surfaces provided certain temperature and moisture conditions exist.  Outdoor molds grow on plants, decaying vegetation and soil.  The major outdoor mold, Alternaria and Cladosporium, are found in very high numbers during hot and dry conditions.  Generally, a late Summer - Fall peak is seen for common outdoor fungal spores.  Rain will temporarily lower outdoor mold spore count, but counts rise rapidly when the rainy period ends.  The most important indoor molds are Aspergillus and Penicillium.  Dark, humid and poorly ventilated basements are ideal sites for mold growth.  The next most common sites of mold growth are the bathroom and the kitchen.  Outdoor [employer] 1. Use air conditioning and keep windows closed 2. Avoid exposure to decaying vegetation. 3. Avoid leaf raking. 4. Avoid grain handling. 5. Consider wearing a face mask if working in moldy areas.  Indoor Mold  Control 1. Maintain humidity below 50%. 2. Clean washable surfaces with 5% bleach solution. 3. Remove sources e.g. Contaminated carpets.    Return in about 3 months (around 04/24/2021), or if symptoms worsen or fail to improve.    Thank you for the opportunity to care for this patient.  Please do not hesitate to contact me with questions.  Althea Charon, FNP [facility] [place]

## 2021-02-27 ENCOUNTER — Ambulatory Visit (HOSPITAL_COMMUNITY): Payer: Medicare Other

## 2021-03-13 ENCOUNTER — Encounter (HOSPITAL_COMMUNITY): Payer: Self-pay

## 2021-03-13 ENCOUNTER — Ambulatory Visit (HOSPITAL_COMMUNITY): Admission: RE | Admit: 2021-03-13 | Payer: Medicare Other | Source: Ambulatory Visit

## 2021-03-14 ENCOUNTER — Ambulatory Visit (HOSPITAL_COMMUNITY): Payer: Medicare Other

## 2021-03-15 ENCOUNTER — Other Ambulatory Visit: Payer: Self-pay | Admitting: Family Medicine

## 2021-03-15 ENCOUNTER — Other Ambulatory Visit (HOSPITAL_COMMUNITY): Payer: Self-pay | Admitting: Family Medicine

## 2021-03-15 DIAGNOSIS — Z780 Asymptomatic menopausal state: Secondary | ICD-10-CM

## 2021-03-15 DIAGNOSIS — Z13820 Encounter for screening for osteoporosis: Secondary | ICD-10-CM

## 2021-03-15 DIAGNOSIS — E042 Nontoxic multinodular goiter: Secondary | ICD-10-CM

## 2021-03-20 ENCOUNTER — Telehealth: Payer: Self-pay

## 2021-03-20 NOTE — Telephone Encounter (Signed)
Pt called in to make an appt for zometa tx.  I noticed that she was a no show for her last appts and pt states she still does not wish to take [place].  Does she need a lab, md visit and zometa?  Please advise

## 2021-03-22 ENCOUNTER — Telehealth: Payer: Self-pay

## 2021-03-22 NOTE — Telephone Encounter (Signed)
Per previous note and secure chat from [place], dr Marin Olp req to see her on 6/10 with labs and then can decide if tx is needed.  I have left her a vm with the appt    Taylor Flynn

## 2021-03-26 ENCOUNTER — Ambulatory Visit (HOSPITAL_COMMUNITY)
Admission: RE | Admit: 2021-03-26 | Discharge: 2021-03-26 | Disposition: A | Discharge status: Home / Self Care | Payer: Medicare Other | Source: Ambulatory Visit | Attending: Family Medicine | Admitting: Family Medicine

## 2021-03-26 ENCOUNTER — Other Ambulatory Visit: Payer: Self-pay

## 2021-03-26 DIAGNOSIS — E042 Nontoxic multinodular goiter: Secondary | ICD-10-CM | POA: Insufficient documentation

## 2021-04-07 ENCOUNTER — Encounter: Payer: Self-pay | Admitting: Hematology & Oncology

## 2021-04-07 ENCOUNTER — Encounter: Payer: Self-pay | Admitting: Family

## 2021-04-13 ENCOUNTER — Inpatient Hospital Stay (HOSPITAL_BASED_OUTPATIENT_CLINIC_OR_DEPARTMENT_OTHER): Payer: Medicare Other | Admitting: Hematology & Oncology

## 2021-04-13 ENCOUNTER — Other Ambulatory Visit: Payer: Medicare Other

## 2021-04-13 ENCOUNTER — Inpatient Hospital Stay: Payer: Medicare Other

## 2021-04-13 ENCOUNTER — Inpatient Hospital Stay: Payer: Medicare Other | Attending: Hematology & Oncology

## 2021-04-13 ENCOUNTER — Other Ambulatory Visit: Payer: Self-pay

## 2021-04-13 VITALS — BP 111/71 | HR 68 | Temp 98.2°F | Resp 18 | Wt 117.0 lb

## 2021-04-13 DIAGNOSIS — Z171 Estrogen receptor negative status [ER-]: Secondary | ICD-10-CM | POA: Insufficient documentation

## 2021-04-13 DIAGNOSIS — C50812 Malignant neoplasm of overlapping sites of left female breast: Secondary | ICD-10-CM

## 2021-04-13 DIAGNOSIS — C50919 Malignant neoplasm of unspecified site of unspecified female breast: Secondary | ICD-10-CM

## 2021-04-13 DIAGNOSIS — C792 Secondary malignant neoplasm of skin: Secondary | ICD-10-CM | POA: Diagnosis not present

## 2021-04-13 DIAGNOSIS — C7931 Secondary malignant neoplasm of brain: Secondary | ICD-10-CM | POA: Diagnosis not present

## 2021-04-13 DIAGNOSIS — C7951 Secondary malignant neoplasm of bone: Secondary | ICD-10-CM | POA: Diagnosis not present

## 2021-04-13 DIAGNOSIS — C787 Secondary malignant neoplasm of liver and intrahepatic bile duct: Secondary | ICD-10-CM | POA: Diagnosis not present

## 2021-04-13 DIAGNOSIS — C50912 Malignant neoplasm of unspecified site of left female breast: Secondary | ICD-10-CM

## 2021-04-13 LAB — LACTATE DEHYDROGENASE: LDH: 167 U/L (ref 98–192)

## 2021-04-13 LAB — CBC WITH DIFFERENTIAL (CANCER CENTER ONLY)
Abs Immature Granulocytes: 0.01 10*3/uL (ref 0.00–0.07)
Basophils Absolute: 0 10*3/uL (ref 0.0–0.1)
Basophils Relative: 1 %
Eosinophils Absolute: 0.1 10*3/uL (ref 0.0–0.5)
Eosinophils Relative: 3 %
HCT: 34.3 % — ABNORMAL LOW (ref 36.0–46.0)
Hemoglobin: 11.1 g/dL — ABNORMAL LOW (ref 12.0–15.0)
Immature Granulocytes: 0 %
Lymphocytes Relative: 38 %
Lymphs Abs: 1.5 10*3/uL (ref 0.7–4.0)
MCH: 29.4 pg (ref 26.0–34.0)
MCHC: 32.4 g/dL (ref 30.0–36.0)
MCV: 91 fL (ref 80.0–100.0)
Monocytes Absolute: 0.4 10*3/uL (ref 0.1–1.0)
Monocytes Relative: 9 %
Neutro Abs: 2 10*3/uL (ref 1.7–7.7)
Neutrophils Relative %: 49 %
Platelet Count: 207 10*3/uL (ref 150–400)
RBC: 3.77 MIL/uL — ABNORMAL LOW (ref 3.87–5.11)
RDW: 14.3 % (ref 11.5–15.5)
WBC Count: 4.1 10*3/uL (ref 4.0–10.5)
nRBC: 0 % (ref 0.0–0.2)

## 2021-04-13 LAB — CMP (CANCER CENTER ONLY)
ALT: 10 U/L (ref 0–44)
AST: 20 U/L (ref 15–41)
Albumin: 4.3 g/dL (ref 3.5–5.0)
Alkaline Phosphatase: 45 U/L (ref 38–126)
Anion gap: 10 (ref 5–15)
BUN: 15 mg/dL (ref 6–20)
CO2: 27 mmol/L (ref 22–32)
Calcium: 9.5 mg/dL (ref 8.9–10.3)
Chloride: 103 mmol/L (ref 98–111)
Creatinine: 0.98 mg/dL (ref 0.44–1.00)
GFR, Estimated: >60 mL/min (ref >60)
Glucose, Bld: 68 mg/dL — ABNORMAL LOW (ref 70–99)
Potassium: 4 mmol/L (ref 3.5–5.1)
Sodium: 140 mmol/L (ref 135–145)
Total Bilirubin: 0.3 mg/dL (ref 0.3–1.2)
Total Protein: 6.7 g/dL (ref 6.5–8.1)

## 2021-04-13 MED ORDER — ZOLEDRONIC ACID 4 MG/100ML IV SOLN
4.0000 mg | Freq: Once | INTRAVENOUS | Status: AC
  Administered 2021-04-13: 4 mg via INTRAVENOUS
  Filled 2021-04-13: qty 100

## 2021-04-13 MED ORDER — SODIUM CHLORIDE 0.9 % IV SOLN
Freq: Once | INTRAVENOUS | Status: AC
  Filled 2021-04-13: qty 250

## 2021-04-13 MED ORDER — HEPARIN SOD (PORK) LOCK FLUSH 100 UNIT/ML IV SOLN
500.0000 [IU] | Freq: Once | INTRAVENOUS | Status: AC
Start: 2021-04-13 — End: 2021-04-13
  Administered 2021-04-13: 500 [IU] via INTRAVENOUS
  Filled 2021-04-13: qty 5

## 2021-04-13 MED ORDER — SODIUM CHLORIDE 0.9% FLUSH
10.0000 mL | INTRAVENOUS | Status: DC | PRN
  Administered 2021-04-13: 10 mL via INTRAVENOUS
  Filled 2021-04-13: qty 10

## 2021-04-13 NOTE — Patient Instructions (Signed)

## 2021-04-13 NOTE — Progress Notes (Signed)
Hematology and Oncology Follow Up Visit  Taylor Flynn [id number] 10-31-1976 45 y.o. 04/13/2021   Principle Diagnosis:  Metastatic breast cancer-ER-/HER-2+ -- new CNS mets  Current Therapy:   Herceptin/Perjeta - s/p cycle 17 (previous cycles given in [place]) -- d/c on 11/10/2020 Kadcyla -- start cycle #1 on 11/14/2020 -- d/c on 11/17/2020 Zometa 4 mg IV q 2 months  --next dose on September 2022    Interim History:  Taylor Flynn is here today for a long awaited follow-up.  We have not seen her since January.  She really looks fantastic.  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  She had stereotactic radiosurgery for brain mets in [place], [place].  She had this in January.  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  Patient said that after Kadcyla, her cutaneous metastasis regressed.  Unfortunately, she cannot tolerate Kadcyla.  After about 6 weeks she said that it began to regrow again.  I know that she has been taking complementary therapy for these metastasis.  She actually has been working from home.  She does telehealth.  She does go up to a hospital in [place] once a month to work on a weekend.  She enjoys this.  I would have to say that just by looking at her you would never tell that she had metastatic breast cancer.  She is not hurting.  There is no problems with bowels or bladder.  She has had no cough or shortness of breath.  She has had no nausea or vomiting.  She has had no rashes.  There is been no headache.  She has had no visual changes.  She has been very diligent with avoiding the coronavirus.  Overall, her performance status is ECOG 1.       Medications:  Allergies as of 04/13/2021       Reactions   Aspirin Swelling   Angioedema    Sulfa Antibiotics Rash        Medication List         Accurate as of April 13, 2021  4:18 PM. If you have any questions, ask your nurse or doctor.          STOP taking these medications    EPINEPHrine 0.3 mg/0.3 mL Soaj injection Commonly known as: EPI-PEN Stopped by: [name], MD   escitalopram 5 MG tablet Commonly known as: Lexapro Stopped by: [name], MD   lidocaine-prilocaine cream Commonly known as: EMLA Stopped by: [name], MD   NALTREXONE HCL PO Stopped by: [name], MD   VITAMIN K2 PO Stopped by: [name], MD       TAKE these medications    BOSWELLIA SERRATA PO Take by mouth. Takes 1/4 tsp three times a day.   NON FORMULARY 2 (two) times daily. Pancreatic enzymes 3 caps AC and 2 caps with meals.   PRESCRIPTION MEDICATION Inject 1 mL into the skin every Monday, Wednesday, and Friday.        Allergies:  Allergies  Allergen Reactions   Aspirin Swelling    Angioedema    Sulfa Antibiotics Rash    Past Medical History, Surgical history, Social history, and Family History were reviewed and updated.  Review of Systems: Review of Systems  Constitutional: Negative.   HENT:  Negative.    Eyes: Negative.   Respiratory: Negative.    Cardiovascular: Negative.   Gastrointestinal: Negative.   Genitourinary: Negative.   Musculoskeletal: Negative.   Skin: Negative.   Neurological: Negative.   Endo/Heme/Allergies: Negative.   Psychiatric/Behavioral: Negative.      Physical Exam:  weight is 117 lb (53.1 kg). Her oral temperature is 98.2 F (36.8 C). Her blood pressure is 111/71 and her pulse is 68. Her respiration is 18 and oxygen saturation is 100%.   Wt Readings from Last 3 Encounters:  04/13/21 117 lb (53.1 kg)  01/22/21 120 lb 9.6 oz (54.7 kg)  01/11/21 122 lb 3.2 oz (55.4 kg)    Physical Exam Vitals reviewed.  Constitutional:      Comments: Her chest wall exam shows the left lumpectomy.  She bases had a mastectomy there now.  She has changes from  surgery.  She has the healing lumpectomy scar.  There is no obvious discharge.  There is no bleeding.  There is no exudate or erythema.  There is no left axillary adenopathy.  HENT:     Head: Normocephalic and atraumatic.  Eyes:     Pupils: Pupils are equal, round, and reactive to light.  Cardiovascular:     Rate and Rhythm: Normal rate and regular rhythm.     Heart sounds: Normal heart sounds.  Pulmonary:     Effort: Pulmonary effort is normal.     Breath sounds: Normal breath sounds.  Abdominal:     General: Bowel sounds are normal.     Palpations: Abdomen is soft.  Musculoskeletal:        General: No tenderness or deformity. Normal range of motion.     Cervical back: Normal range of motion.  Lymphadenopathy:     Cervical: No cervical adenopathy.  Skin:    General: Skin is warm and dry.     Findings: No erythema or rash.  Neurological:     Mental Status: She is alert and oriented to person, place, and time.  Psychiatric:        Behavior: Behavior normal.        Thought Content: Thought content normal.        Judgment: Judgment normal.     Lab Results  Component Value Date   WBC 4.1 04/13/2021   HGB 11.1 (L) 04/13/2021   HCT 34.3 (L) 04/13/2021   MCV 91.0 04/13/2021   PLT 207 04/13/2021   Lab Results  Component Value Date   FERRITIN 6 (L) 12/16/2019   IRON 32 (L) 12/16/2019   TIBC 391 12/16/2019   UIBC 359 12/16/2019   IRONPCTSAT 8 (L) 12/16/2019   Lab Results  Component Value Date   RETICCTPCT 0.8 11/17/2018   RBC 3.77 (L) 04/13/2021   No results found for: KPAFRELGTCHN, LAMBDASER, KAPLAMBRATIO No results found for: IGGSERUM, IGA, IGMSERUM No results found for: Kathrynn Ducking, MSPIKE, SPEI   Chemistry      Component Value Date/Time   NA 140 04/13/2021 1049   K 4.0 04/13/2021 1049   CL 103 04/13/2021 1049   CO2 27 04/13/2021 1049   BUN 15 04/13/2021 1049   CREATININE 0.98 04/13/2021 1049      Component Value  Date/Time   CALCIUM 9.5 04/13/2021 1049   ALKPHOS 45 04/13/2021 1049   AST 20 04/13/2021 1049   ALT 10 04/13/2021 1049   BILITOT 0.3 04/13/2021 1049       Impression and Plan:   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  I am just happy that she is doing well.  Of note, she has been getting her scans elsewhere.  I think she been out to [place].  She is down to [place], [place].  I just worry about the brain metastasis.  The ones that we found back in January were asymptomatic.  She will be down in [place] for about thing 4-6 weeks.  She has family doctor that she will stay with.  I am just impressed with her resilience.  She is showing a lot of toughness.  I would really like to see her back when she returns from [place].  We may have to set her up with an MRI of the brain to see how everything looks when she gets back.  She said that she would be willing to retry Herceptin.  I really think that if you are going to go down that road, my probably would see about something more effective than Herceptin.  I will know if she has done Perjeta with Herceptin.  I think that Enhertu would be optimal.  However, I am not sure she would agree to take Enhertu.  She will get Zometa today.  Her next dose will be in 3 months.  I told her respect her wishes as far as her healthcare is concerned, she knows herself very well and clearly she has made the right decision so far.  I forgot to mention that her daughter will be going to [facility].  This is really a big moment for her.  Cassell Smiles, MD 6/10/20224:18 PM

## 2021-04-14 LAB — CANCER ANTIGEN 27.29: CA 27.29: 19.4 U/mL (ref 0.0–38.6)

## 2021-04-16 ENCOUNTER — Telehealth: Payer: Self-pay

## 2021-04-23 ENCOUNTER — Other Ambulatory Visit (HOSPITAL_COMMUNITY): Payer: Self-pay | Admitting: Neurosurgery

## 2021-04-23 ENCOUNTER — Other Ambulatory Visit: Payer: Self-pay | Admitting: Neurosurgery

## 2021-04-23 DIAGNOSIS — C7931 Secondary malignant neoplasm of brain: Secondary | ICD-10-CM

## 2021-05-02 ENCOUNTER — Other Ambulatory Visit (HOSPITAL_COMMUNITY): Payer: Self-pay | Admitting: Neurosurgery

## 2021-05-02 DIAGNOSIS — C7931 Secondary malignant neoplasm of brain: Secondary | ICD-10-CM

## 2021-05-11 ENCOUNTER — Encounter (HOSPITAL_COMMUNITY): Payer: Self-pay

## 2021-05-11 ENCOUNTER — Ambulatory Visit (HOSPITAL_COMMUNITY): Payer: Medicare Other

## 2021-05-16 ENCOUNTER — Encounter: Payer: Self-pay | Admitting: Family

## 2021-05-16 ENCOUNTER — Encounter: Payer: Self-pay | Admitting: Hematology & Oncology

## 2021-05-18 ENCOUNTER — Other Ambulatory Visit: Payer: Self-pay

## 2021-05-18 ENCOUNTER — Ambulatory Visit (HOSPITAL_COMMUNITY)
Admission: RE | Admit: 2021-05-18 | Discharge: 2021-05-18 | Disposition: A | Discharge status: Home / Self Care | Source: Ambulatory Visit | Attending: Neurosurgery | Admitting: Neurosurgery

## 2021-05-18 DIAGNOSIS — C7931 Secondary malignant neoplasm of brain: Secondary | ICD-10-CM | POA: Diagnosis present

## 2021-05-18 MED ORDER — GADOBUTROL 1 MMOL/ML IV SOLN
5.0000 mL | Freq: Once | INTRAVENOUS | Status: AC | PRN
  Administered 2021-05-18: 5 mL via INTRAVENOUS

## 2021-06-13 ENCOUNTER — Telehealth: Payer: Self-pay

## 2021-06-13 ENCOUNTER — Inpatient Hospital Stay: Payer: Medicare Other

## 2021-06-13 ENCOUNTER — Other Ambulatory Visit: Payer: Self-pay

## 2021-06-13 ENCOUNTER — Inpatient Hospital Stay: Payer: Medicare Other | Attending: Hematology & Oncology

## 2021-06-13 ENCOUNTER — Encounter: Payer: Self-pay | Admitting: Family

## 2021-06-13 ENCOUNTER — Encounter: Payer: Self-pay | Admitting: Hematology & Oncology

## 2021-06-13 ENCOUNTER — Inpatient Hospital Stay (HOSPITAL_BASED_OUTPATIENT_CLINIC_OR_DEPARTMENT_OTHER): Payer: Medicare Other | Admitting: Hematology & Oncology

## 2021-06-13 VITALS — BP 101/63 | HR 61 | Temp 97.7°F | Resp 16 | Ht 63.0 in | Wt 118.1 lb

## 2021-06-13 DIAGNOSIS — C50812 Malignant neoplasm of overlapping sites of left female breast: Secondary | ICD-10-CM

## 2021-06-13 DIAGNOSIS — C787 Secondary malignant neoplasm of liver and intrahepatic bile duct: Secondary | ICD-10-CM

## 2021-06-13 DIAGNOSIS — Z5112 Encounter for antineoplastic immunotherapy: Secondary | ICD-10-CM | POA: Diagnosis present

## 2021-06-13 DIAGNOSIS — C50919 Malignant neoplasm of unspecified site of unspecified female breast: Secondary | ICD-10-CM

## 2021-06-13 DIAGNOSIS — C50912 Malignant neoplasm of unspecified site of left female breast: Secondary | ICD-10-CM | POA: Diagnosis not present

## 2021-06-13 DIAGNOSIS — C792 Secondary malignant neoplasm of skin: Secondary | ICD-10-CM

## 2021-06-13 DIAGNOSIS — Z171 Estrogen receptor negative status [ER-]: Secondary | ICD-10-CM

## 2021-06-13 LAB — CMP (CANCER CENTER ONLY)
ALT: 10 U/L (ref 0–44)
AST: 22 U/L (ref 15–41)
Albumin: 3.9 g/dL (ref 3.5–5.0)
Alkaline Phosphatase: 39 U/L (ref 38–126)
Anion gap: 15 (ref 5–15)
BUN: 15 mg/dL (ref 6–20)
CO2: 21 mmol/L — ABNORMAL LOW (ref 22–32)
Calcium: 9.2 mg/dL (ref 8.9–10.3)
Chloride: 103 mmol/L (ref 98–111)
Creatinine: 0.88 mg/dL (ref 0.44–1.00)
GFR, Estimated: >60 mL/min (ref >60)
Glucose, Bld: 41 mg/dL — CL (ref 70–99)
Potassium: 3.8 mmol/L (ref 3.5–5.1)
Sodium: 139 mmol/L (ref 135–145)
Total Bilirubin: 0.4 mg/dL (ref 0.3–1.2)
Total Protein: 6.2 g/dL — ABNORMAL LOW (ref 6.5–8.1)

## 2021-06-13 LAB — CBC WITH DIFFERENTIAL (CANCER CENTER ONLY)
Abs Immature Granulocytes: 0.01 10*3/uL (ref 0.00–0.07)
Basophils Absolute: 0 10*3/uL (ref 0.0–0.1)
Basophils Relative: 1 %
Eosinophils Absolute: 0.1 10*3/uL (ref 0.0–0.5)
Eosinophils Relative: 3 %
HCT: 32.2 % — ABNORMAL LOW (ref 36.0–46.0)
Hemoglobin: 10.3 g/dL — ABNORMAL LOW (ref 12.0–15.0)
Immature Granulocytes: 0 %
Lymphocytes Relative: 35 %
Lymphs Abs: 1.4 10*3/uL (ref 0.7–4.0)
MCH: 29.6 pg (ref 26.0–34.0)
MCHC: 32 g/dL (ref 30.0–36.0)
MCV: 92.5 fL (ref 80.0–100.0)
Monocytes Absolute: 0.3 10*3/uL (ref 0.1–1.0)
Monocytes Relative: 8 %
Neutro Abs: 2.2 10*3/uL (ref 1.7–7.7)
Neutrophils Relative %: 53 %
Platelet Count: 208 10*3/uL (ref 150–400)
RBC: 3.48 MIL/uL — ABNORMAL LOW (ref 3.87–5.11)
RDW: 15.4 % (ref 11.5–15.5)
WBC Count: 4.1 10*3/uL (ref 4.0–10.5)
nRBC: 0 % (ref 0.0–0.2)

## 2021-06-13 LAB — LACTATE DEHYDROGENASE: LDH: 167 U/L (ref 98–192)

## 2021-06-13 MED ORDER — SODIUM CHLORIDE 0.9 % IV SOLN
630.0000 mg | Freq: Once | INTRAVENOUS | Status: AC
  Administered 2021-06-13: 630 mg via INTRAVENOUS
  Filled 2021-06-13: qty 21

## 2021-06-13 MED ORDER — HEPARIN SOD (PORK) LOCK FLUSH 100 UNIT/ML IV SOLN
500.0000 [IU] | Freq: Once | INTRAVENOUS | Status: DC | PRN
  Filled 2021-06-13: qty 5

## 2021-06-13 MED ORDER — SODIUM CHLORIDE 0.9 % IV SOLN
Freq: Once | INTRAVENOUS | Status: DC
  Filled 2021-06-13: qty 250

## 2021-06-13 MED ORDER — SODIUM CHLORIDE 0.9% FLUSH
10.0000 mL | INTRAVENOUS | Status: DC | PRN
  Administered 2021-06-13: 10 mL
  Filled 2021-06-13: qty 10

## 2021-06-13 MED ORDER — SODIUM CHLORIDE 0.9 % IV SOLN
Freq: Once | INTRAVENOUS | Status: AC
  Filled 2021-06-13: qty 250

## 2021-06-13 MED ORDER — ZOLEDRONIC ACID 4 MG/100ML IV SOLN: 4.0000 mg | Freq: Once | INTRAVENOUS | Status: DC

## 2021-06-13 MED ORDER — HEPARIN SOD (PORK) LOCK FLUSH 100 UNIT/ML IV SOLN
500.0000 [IU] | Freq: Once | INTRAVENOUS | Status: AC | PRN
  Administered 2021-06-13: 500 [IU]
  Filled 2021-06-13: qty 5

## 2021-06-13 MED ORDER — SODIUM CHLORIDE 0.9% FLUSH
10.0000 mL | Freq: Once | INTRAVENOUS | Status: DC | PRN
  Filled 2021-06-13: qty 10

## 2021-06-13 NOTE — Progress Notes (Signed)
Pt refuses to take Tylenol or Benadryl as pre meds.  Dr. Marin Olp aware.

## 2021-06-13 NOTE — Progress Notes (Signed)
Hematology and Oncology Follow Up Visit  Clarence Dunsmore [id number] November 21, 1975 45 y.o. 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 Barcia is here today for follow-up.  We last also gave her treatment packing January.  She had a hard time she said with the Kadcyla.  She does not want any further Kadcyla.  She just had the 1 cycle.  She did have stereotactic radiosurgery for the brain.  She had MRI of the brain recently.  This showed that she had a nice response.  She had resolution of some of the lesions.  There were some smaller lesions.  The largest cerebellar lesion measures 6 mm but it was not felt to be active.  The problem that she is having is that she has more cutaneous nodules.  This is on the left chest wall.  I really should not be too surprised by this given the fact that it has been about 6 or 7 months since she had treatment.  Her last CA 27.29 was 19.4.  This was in June.  She still does not wish to have Enhertu.  I think this would be considered standard therapy for metastatic breast cancer that is HER2 positive.  She wants to go with Perjeta.  She does not want Herceptin.  She is very attuned to her body.  She knows what her body can tolerate.  She does not think she could handle the combination of Herceptin and Perjeta.  She has had a busy work life.  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  She does not have any problems with pain.  There is no change in bowel or bladder habits.  She has had no cough or shortness of breath.  She has had no leg swelling.  Overall, I would have to say  performance status is probably ECOG 1.        Medications:  Allergies as of 06/13/2021       Reactions   Aspirin Swelling   Angioedema    Sulfa Antibiotics Rash        Medication List        Accurate as of June 13, 2021  4:44 PM. If you have any questions, ask your nurse or doctor.          BOSWELLIA SERRATA PO Take by mouth. Takes 1/4 tsp three times a day.   NON FORMULARY 2 (two) times daily. Pancreatic enzymes 3 caps AC and 2 caps with meals.   PRESCRIPTION MEDICATION Inject 1 mL into the skin every Monday, Wednesday, and Friday.        Allergies:  Allergies  Allergen Reactions   Aspirin Swelling    Angioedema    Sulfa Antibiotics Rash    Past Medical History, Surgical history, Social history, and Family History were reviewed and updated.  Review of Systems: Review of Systems  Constitutional: Negative.   HENT: Negative.    Eyes: Negative.   Respiratory: Negative.    Cardiovascular: Negative.   Gastrointestinal: Negative.   Genitourinary: Negative.   Musculoskeletal: Negative.   Skin: Negative.  Neurological: Negative.   Endo/Heme/Allergies: Negative.   Psychiatric/Behavioral: Negative.      Physical Exam:  height is _0  (1.6 m) and weight is 118 lb 1.3 oz (53.6 kg). Her oral temperature is 97.7 F (36.5 C). Her blood pressure is 101/63 and her pulse is 61. Her respiration is 16 and oxygen saturation is 100%.   Wt Readings from Last 3 Encounters:  06/13/21 118 lb 1.3 oz (53.6 kg)  04/13/21 117 lb (53.1 kg)  01/22/21 120 lb 9.6 oz (54.7 kg)    Physical Exam Vitals reviewed.  Constitutional:      Comments: Her chest wall exam shows the left lumpectomy.  She bases had a mastectomy there now.  She has changes from surgery.  She has the healing lumpectomy scar.  There is no obvious discharge.  There is no bleeding.  There is no exudate or erythema.  There is no left axillary adenopathy.  HENT:     Head: Normocephalic and atraumatic.   Eyes:     Pupils: Pupils are equal, round, and reactive to light.  Cardiovascular:     Rate and Rhythm: Normal rate and regular rhythm.     Heart sounds: Normal heart sounds.  Pulmonary:     Effort: Pulmonary effort is normal.     Breath sounds: Normal breath sounds.  Abdominal:     General: Bowel sounds are normal.     Palpations: Abdomen is soft.  Musculoskeletal:        General: No tenderness or deformity. Normal range of motion.     Cervical back: Normal range of motion.  Lymphadenopathy:     Cervical: No cervical adenopathy.  Skin:    General: Skin is warm and dry.     Findings: No erythema or rash.  Neurological:     Mental Status: She is alert and oriented to person, place, and time.  Psychiatric:        Behavior: Behavior normal.        Thought Content: Thought content normal.        Judgment: Judgment normal.     Lab Results  Component Value Date   WBC 4.1 06/13/2021   HGB 10.3 (L) 06/13/2021   HCT 32.2 (L) 06/13/2021   MCV 92.5 06/13/2021   PLT 208 06/13/2021   Lab Results  Component Value Date   FERRITIN 6 (L) 12/16/2019   IRON 32 (L) 12/16/2019   TIBC 391 12/16/2019   UIBC 359 12/16/2019   IRONPCTSAT 8 (L) 12/16/2019   Lab Results  Component Value Date   RETICCTPCT 0.8 11/17/2018   RBC 3.48 (L) 06/13/2021   No results found for: KPAFRELGTCHN, LAMBDASER, KAPLAMBRATIO No results found for: IGGSERUM, IGA, IGMSERUM No results found for: Odetta Pink, SPEI   Chemistry      Component Value Date/Time   NA 139 06/13/2021 0935   K 3.8 06/13/2021 0935   CL 103 06/13/2021 0935   CO2 21 (L) 06/13/2021 0935   BUN 15 06/13/2021 0935   CREATININE 0.88 06/13/2021 0935      Component Value Date/Time   CALCIUM 9.2 06/13/2021 0935   ALKPHOS 39 06/13/2021 0935   AST 22 06/13/2021 0935   ALT 10 06/13/2021 0935   BILITOT 0.4 06/13/2021 0935       Impression and Plan:   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  I forgot to mention that she  actually served in the [employer].  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  She was in [place].  I really had to give her a lot of credit for being a true American hero.  We will certainly do everything we can to help her and to acknowledge her wishes.  I also recommended that she has a daughter who is going to go to [place].  This is really huge for her.  She is very happy about this.  She is wants a good quality of life.  She has is right now.  I am just thankful that she is able to do what she would like.  I still think that at some point, we do not have to go to Enhertu.  We will do single agent Perjeta for right now.  She feels comfortable doing this.  We could certainly tell up by the cutaneous metastasis as to whether or not this will work.  I think it is little bit early for [place] to do any kind of scans on her.  We will have her come back to see [place] in another 3 weeks.  She is a Writer of [employer].  As such, we have a special bond since I also graduated from there.   [name], MD 8/10/20224:44 PM

## 2021-06-13 NOTE — Progress Notes (Signed)
Patient will receive Perjeta only at a reduced dose today, NPR per Lendell Caprice, financial advocate. Patient does not wish to receive trastuzumab or zoledronic acid at this time. Dr. Marin Olp aware.

## 2021-06-13 NOTE — Telephone Encounter (Signed)
Received critical low glucose of 41. Offered nutrition to pt who declined as she is currently fasting. Dr Marin Olp and infusion staff notified. dph

## 2021-06-14 LAB — CANCER ANTIGEN 27.29: CA 27.29: 16 U/mL (ref 0.0–38.6)

## 2021-07-05 ENCOUNTER — Ambulatory Visit: Payer: Medicare Other | Admitting: Hematology & Oncology

## 2021-07-05 ENCOUNTER — Other Ambulatory Visit: Payer: Medicare Other

## 2021-07-05 ENCOUNTER — Ambulatory Visit: Payer: Medicare Other

## 2021-07-24 ENCOUNTER — Other Ambulatory Visit: Payer: Medicare Other

## 2021-07-24 ENCOUNTER — Telehealth: Payer: Self-pay

## 2021-07-25 ENCOUNTER — Inpatient Hospital Stay: Payer: Medicare Other

## 2021-07-25 ENCOUNTER — Inpatient Hospital Stay: Payer: Medicare Other | Admitting: Hematology & Oncology

## 2021-08-07 ENCOUNTER — Encounter: Payer: Self-pay | Admitting: Family

## 2021-08-07 ENCOUNTER — Encounter: Payer: Self-pay | Admitting: Hematology & Oncology

## 2021-08-15 ENCOUNTER — Inpatient Hospital Stay

## 2021-08-15 ENCOUNTER — Inpatient Hospital Stay: Admitting: Hematology & Oncology

## 2021-09-07 ENCOUNTER — Inpatient Hospital Stay

## 2021-09-07 ENCOUNTER — Other Ambulatory Visit: Payer: Self-pay

## 2021-09-07 ENCOUNTER — Inpatient Hospital Stay (HOSPITAL_BASED_OUTPATIENT_CLINIC_OR_DEPARTMENT_OTHER): Admitting: Hematology & Oncology

## 2021-09-07 ENCOUNTER — Inpatient Hospital Stay: Attending: Hematology & Oncology

## 2021-09-07 ENCOUNTER — Encounter: Payer: Self-pay | Admitting: Hematology & Oncology

## 2021-09-07 VITALS — BP 110/77 | HR 67 | Temp 97.6°F | Resp 20 | Wt 117.1 lb

## 2021-09-07 DIAGNOSIS — C50919 Malignant neoplasm of unspecified site of unspecified female breast: Secondary | ICD-10-CM | POA: Diagnosis present

## 2021-09-07 DIAGNOSIS — C792 Secondary malignant neoplasm of skin: Secondary | ICD-10-CM | POA: Diagnosis not present

## 2021-09-07 DIAGNOSIS — C787 Secondary malignant neoplasm of liver and intrahepatic bile duct: Secondary | ICD-10-CM | POA: Diagnosis not present

## 2021-09-07 DIAGNOSIS — Z95828 Presence of other vascular implants and grafts: Secondary | ICD-10-CM

## 2021-09-07 DIAGNOSIS — C7931 Secondary malignant neoplasm of brain: Secondary | ICD-10-CM | POA: Diagnosis not present

## 2021-09-07 DIAGNOSIS — Z452 Encounter for adjustment and management of vascular access device: Secondary | ICD-10-CM | POA: Insufficient documentation

## 2021-09-07 DIAGNOSIS — C50812 Malignant neoplasm of overlapping sites of left female breast: Secondary | ICD-10-CM

## 2021-09-07 DIAGNOSIS — C50912 Malignant neoplasm of unspecified site of left female breast: Secondary | ICD-10-CM

## 2021-09-07 LAB — CBC WITH DIFFERENTIAL (CANCER CENTER ONLY)
Abs Immature Granulocytes: 0.01 10*3/uL (ref 0.00–0.07)
Basophils Absolute: 0 10*3/uL (ref 0.0–0.1)
Basophils Relative: 1 %
Eosinophils Absolute: 0.2 10*3/uL (ref 0.0–0.5)
Eosinophils Relative: 3 %
HCT: 30.5 % — ABNORMAL LOW (ref 36.0–46.0)
Hemoglobin: 9.7 g/dL — ABNORMAL LOW (ref 12.0–15.0)
Immature Granulocytes: 0 %
Lymphocytes Relative: 24 %
Lymphs Abs: 1.4 10*3/uL (ref 0.7–4.0)
MCH: 29.4 pg (ref 26.0–34.0)
MCHC: 31.8 g/dL (ref 30.0–36.0)
MCV: 92.4 fL (ref 80.0–100.0)
Monocytes Absolute: 0.4 10*3/uL (ref 0.1–1.0)
Monocytes Relative: 7 %
Neutro Abs: 3.7 10*3/uL (ref 1.7–7.7)
Neutrophils Relative %: 65 %
Platelet Count: 259 10*3/uL (ref 150–400)
RBC: 3.3 MIL/uL — ABNORMAL LOW (ref 3.87–5.11)
RDW: 14.6 % (ref 11.5–15.5)
WBC Count: 5.7 10*3/uL (ref 4.0–10.5)
nRBC: 0 % (ref 0.0–0.2)

## 2021-09-07 LAB — CMP (CANCER CENTER ONLY)
ALT: 10 U/L (ref 0–44)
AST: 23 U/L (ref 15–41)
Albumin: 4.3 g/dL (ref 3.5–5.0)
Alkaline Phosphatase: 42 U/L (ref 38–126)
Anion gap: 12 (ref 5–15)
BUN: 18 mg/dL (ref 6–20)
CO2: 25 mmol/L (ref 22–32)
Calcium: 9.8 mg/dL (ref 8.9–10.3)
Chloride: 104 mmol/L (ref 98–111)
Creatinine: 0.97 mg/dL (ref 0.44–1.00)
GFR, Estimated: >60 mL/min (ref >60)
Glucose, Bld: 70 mg/dL (ref 70–99)
Potassium: 4 mmol/L (ref 3.5–5.1)
Sodium: 141 mmol/L (ref 135–145)
Total Bilirubin: 0.3 mg/dL (ref 0.3–1.2)
Total Protein: 6.8 g/dL (ref 6.5–8.1)

## 2021-09-07 LAB — LACTATE DEHYDROGENASE: LDH: 182 U/L (ref 98–192)

## 2021-09-07 MED ORDER — HEPARIN SOD (PORK) LOCK FLUSH 100 UNIT/ML IV SOLN
500.0000 [IU] | Freq: Once | INTRAVENOUS | Status: AC
  Administered 2021-09-07: 500 [IU] via INTRAVENOUS

## 2021-09-07 MED ORDER — SODIUM CHLORIDE 0.9% FLUSH
10.0000 mL | Freq: Once | INTRAVENOUS | Status: AC
  Administered 2021-09-07: 10 mL via INTRAVENOUS

## 2021-09-07 NOTE — Patient Instructions (Signed)

## 2021-09-07 NOTE — Progress Notes (Signed)
Hematology and Oncology Follow Up Visit  Taylor Flynn [id number] 1976/03/18 45 y.o. 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  We have not seen her for a little bit.  As always, she has been off to see other doctors.  She has been down in [place].  She does have more cutaneous mets on the left anterior chest wall.  She says she was down in [place] getting photodynamic therapy.  Unfortunately, has been very expensive.  She is not sure when she can go back down to get more because of expenses.  We tried her on Perjeta.  This probably is not working.  Again she had 1 dose back 3 months ago.  She has not had external beam radiation therapy.  This is always an option for these cutaneous mets.  She says she had a special PET scan that was done.  It did not show any cancer elsewhere beside the chest wall.  I told her that we could certainly try Enhertu.  Again, she been very reluctant to do this because of the pulmonary toxicity that she has read about.  I told her this is unusual.  I think that the benefits would outweigh the risks.  I told her if she ever did develop any kind of pulmonary toxicity, we would stop the Enhertu and cut the dose back.  She has agreed to the [employer].  Again, she looks good.  She feels okay.  She is still working.  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  I think that she is going to work down in [place], [place].  She has had no problems with headache.  She has had brain mets that were treated.  She has had no change in  bowel or bladder habits.  She has had no problems with fever.  She has had no issues with COVID.  Overall, I would say her performance status is probably ECOG 1.       Medications:  Allergies as of 09/07/2021       Reactions   Aspirin Swelling   Angioedema    Sulfa Antibiotics Rash        Medication List        Accurate as of September 07, 2021  9:38 AM. If you have any questions, ask your nurse or doctor.          STOP taking these medications    BOSWELLIA SERRATA PO Stopped by: [name], MD       TAKE these medications    MELATONIN PO Take 150 mg by mouth at bedtime.   NALTREXONE HCL PO Take 4.5 mg by mouth 3 (three) times a week.   NON FORMULARY 2 (two) times daily. Pancreatic enzymes 3 caps AC and 2 caps with meals.   OVER THE COUNTER MEDICATION Vitamin A drops-2 drops daily.   PRESCRIPTION MEDICATION Inject 1 mL into the skin every Monday, Wednesday, and Friday.   Vitamin D 125 MCG (5000 UT) Caps Take by mouth daily.  Allergies:  Allergies  Allergen Reactions   Aspirin Swelling    Angioedema    Sulfa Antibiotics Rash    Past Medical History, Surgical history, Social history, and Family History were reviewed and updated.  Review of Systems: Review of Systems  Constitutional: Negative.   HENT: Negative.    Eyes: Negative.   Respiratory: Negative.    Cardiovascular: Negative.   Gastrointestinal: Negative.   Genitourinary: Negative.   Musculoskeletal: Negative.   Skin: Negative.   Neurological: Negative.   Endo/Heme/Allergies: Negative.   Psychiatric/Behavioral: Negative.      Physical Exam:  weight is 117 lb 1.3 oz (53.1 kg). Her oral temperature is 97.6 F (36.4 C). Her blood pressure is 110/77 and her pulse is 67. Her respiration is 20 and oxygen saturation is 100%.   Wt Readings from Last 3 Encounters:  09/07/21 117 lb 1.3 oz (53.1 kg)  06/13/21 118 lb 1.3 oz (53.6 kg)  04/13/21 117 lb (53.1 kg)    Physical  Exam Vitals reviewed.  Constitutional:      Comments: Her chest wall exam shows the left lumpectomy.  She has multiple nodules now.  There is a relatively large nodule in the center of the left anterior chest wall.  It is firm.  It is not mobile.  It probably measures about a centimeter.  She has other nodules that are open.  She has a dressing over these.   HENT:     Head: Normocephalic and atraumatic.  Eyes:     Pupils: Pupils are equal, round, and reactive to light.  Cardiovascular:     Rate and Rhythm: Normal rate and regular rhythm.     Heart sounds: Normal heart sounds.  Pulmonary:     Effort: Pulmonary effort is normal.     Breath sounds: Normal breath sounds.  Abdominal:     General: Bowel sounds are normal.     Palpations: Abdomen is soft.  Musculoskeletal:        General: No tenderness or deformity. Normal range of motion.     Cervical back: Normal range of motion.  Lymphadenopathy:     Cervical: No cervical adenopathy.  Skin:    General: Skin is warm and dry.     Findings: No erythema or rash.  Neurological:     Mental Status: She is alert and oriented to person, place, and time.  Psychiatric:        Behavior: Behavior normal.        Thought Content: Thought content normal.        Judgment: Judgment normal.     Lab Results  Component Value Date   WBC 5.7 09/07/2021   HGB 9.7 (L) 09/07/2021   HCT 30.5 (L) 09/07/2021   MCV 92.4 09/07/2021   PLT 259 09/07/2021   Lab Results  Component Value Date   FERRITIN 6 (L) 12/16/2019   IRON 32 (L) 12/16/2019   TIBC 391 12/16/2019   UIBC 359 12/16/2019   IRONPCTSAT 8 (L) 12/16/2019   Lab Results  Component Value Date   RETICCTPCT 0.8 11/17/2018   RBC 3.30 (L) 09/07/2021   No results found for: KPAFRELGTCHN, LAMBDASER, KAPLAMBRATIO No results found for: IGGSERUM, IGA, IGMSERUM No results found for: TOTALPROTELP, ALBUMINELP, A1GS, A2GS, BETS, BETA2SER, GAMS, MSPIKE, SPEI   Chemistry      Component Value Date/Time    NA 139 06/13/2021 0935   K 3.8 06/13/2021 0935   CL 103 06/13/2021 0935   CO2 21 (L) 06/13/2021 0935  BUN 15 06/13/2021 0935   CREATININE 0.88 06/13/2021 0935      Component Value Date/Time   CALCIUM 9.2 06/13/2021 0935   ALKPHOS 39 06/13/2021 0935   AST 22 06/13/2021 0935   ALT 10 06/13/2021 0935   BILITOT 0.4 06/13/2021 0935       Impression and Plan:   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  I hope that she will do the Enhertu.  I do think this is not a bad idea for her.  I do think she would tolerate this.  Again, I think that using external beam radiation for these cutaneous metastasis would not be a bad idea.  I think this would be effective for her.  We will try to get the Enhertu set up for a couple weeks.  Somehow, it would not surprise me if she goes out of state to one of her other doctors.  I know she has several doctors that she does go to.  I know she has a lot of confidence in them.  We will try to get things going in a couple weeks.  She will also think about the possibility of external beam radiation.  I will plan to see her back when she has her second cycle of Enhertu.    [name], MD 11/4/20229:38 AM

## 2021-09-08 LAB — CANCER ANTIGEN 27.29: CA 27.29: 12.1 U/mL (ref 0.0–38.6)

## 2021-09-10 LAB — CHROMOGRANIN A: Chromogranin A (ng/mL): 186 ng/mL — ABNORMAL HIGH (ref 0.0–101.8)

## 2021-09-11 ENCOUNTER — Telehealth: Payer: Self-pay | Admitting: Hematology & Oncology

## 2021-09-12 ENCOUNTER — Encounter: Payer: Self-pay | Admitting: Family

## 2021-09-12 ENCOUNTER — Encounter: Payer: Self-pay | Admitting: Hematology & Oncology

## 2021-09-26 ENCOUNTER — Ambulatory Visit: Payer: TRICARE For Life (TFL)

## 2021-09-26 ENCOUNTER — Inpatient Hospital Stay: Payer: TRICARE For Life (TFL)

## 2021-10-16 ENCOUNTER — Inpatient Hospital Stay

## 2021-10-16 ENCOUNTER — Inpatient Hospital Stay: Admitting: Hematology & Oncology

## 2021-10-16 ENCOUNTER — Inpatient Hospital Stay: Attending: Hematology & Oncology

## 2021-11-13 ENCOUNTER — Other Ambulatory Visit: Payer: Self-pay | Admitting: Neurosurgery

## 2021-11-13 ENCOUNTER — Other Ambulatory Visit (HOSPITAL_COMMUNITY): Payer: Self-pay | Admitting: Neurosurgery

## 2021-11-13 DIAGNOSIS — C7931 Secondary malignant neoplasm of brain: Secondary | ICD-10-CM

## 2021-11-19 ENCOUNTER — Encounter: Payer: Self-pay | Admitting: Hematology & Oncology

## 2021-11-19 ENCOUNTER — Encounter: Payer: Self-pay | Admitting: Family

## 2021-11-20 ENCOUNTER — Ambulatory Visit (HOSPITAL_COMMUNITY)
Admission: RE | Admit: 2021-11-20 | Discharge: 2021-11-20 | Disposition: A | Discharge status: Home / Self Care | Payer: Medicare Other | Source: Ambulatory Visit | Attending: Neurosurgery | Admitting: Neurosurgery

## 2021-11-20 ENCOUNTER — Other Ambulatory Visit: Payer: Self-pay

## 2021-11-20 ENCOUNTER — Ambulatory Visit (HOSPITAL_COMMUNITY): Admission: RE | Admit: 2021-11-20 | Source: Ambulatory Visit

## 2021-11-20 DIAGNOSIS — C7931 Secondary malignant neoplasm of brain: Secondary | ICD-10-CM | POA: Insufficient documentation

## 2021-11-20 MED ORDER — GADOBUTROL 1 MMOL/ML IV SOLN
6.0000 mL | Freq: Once | INTRAVENOUS | Status: AC | PRN
  Administered 2021-11-20: 6 mL via INTRAVENOUS

## 2021-12-10 ENCOUNTER — Inpatient Hospital Stay

## 2021-12-10 ENCOUNTER — Inpatient Hospital Stay (HOSPITAL_BASED_OUTPATIENT_CLINIC_OR_DEPARTMENT_OTHER): Admitting: Hematology & Oncology

## 2021-12-10 ENCOUNTER — Inpatient Hospital Stay: Attending: Hematology & Oncology

## 2021-12-10 ENCOUNTER — Encounter: Payer: Self-pay | Admitting: Hematology & Oncology

## 2021-12-10 ENCOUNTER — Other Ambulatory Visit: Payer: Self-pay

## 2021-12-10 VITALS — BP 110/60 | HR 68 | Temp 97.8°F | Resp 18 | Ht 63.0 in | Wt 119.1 lb

## 2021-12-10 DIAGNOSIS — C787 Secondary malignant neoplasm of liver and intrahepatic bile duct: Secondary | ICD-10-CM

## 2021-12-10 DIAGNOSIS — C7931 Secondary malignant neoplasm of brain: Secondary | ICD-10-CM

## 2021-12-10 DIAGNOSIS — Z452 Encounter for adjustment and management of vascular access device: Secondary | ICD-10-CM | POA: Insufficient documentation

## 2021-12-10 DIAGNOSIS — C50919 Malignant neoplasm of unspecified site of unspecified female breast: Secondary | ICD-10-CM | POA: Diagnosis present

## 2021-12-10 LAB — CMP (CANCER CENTER ONLY)
ALT: 14 U/L (ref 0–44)
AST: 26 U/L (ref 15–41)
Albumin: 4.2 g/dL (ref 3.5–5.0)
Alkaline Phosphatase: 51 U/L (ref 38–126)
Anion gap: 9 (ref 5–15)
BUN: 8 mg/dL (ref 6–20)
CO2: 26 mmol/L (ref 22–32)
Calcium: 9.3 mg/dL (ref 8.9–10.3)
Chloride: 103 mmol/L (ref 98–111)
Creatinine: 0.88 mg/dL (ref 0.44–1.00)
GFR, Estimated: >60 mL/min (ref >60)
Glucose, Bld: 68 mg/dL — ABNORMAL LOW (ref 70–99)
Potassium: 3.7 mmol/L (ref 3.5–5.1)
Sodium: 138 mmol/L (ref 135–145)
Total Bilirubin: 0.4 mg/dL (ref 0.3–1.2)
Total Protein: 6.6 g/dL (ref 6.5–8.1)

## 2021-12-10 LAB — CBC WITH DIFFERENTIAL (CANCER CENTER ONLY)
Abs Immature Granulocytes: 0.01 10*3/uL (ref 0.00–0.07)
Basophils Absolute: 0.1 10*3/uL (ref 0.0–0.1)
Basophils Relative: 1 %
Eosinophils Absolute: 0.1 10*3/uL (ref 0.0–0.5)
Eosinophils Relative: 2 %
HCT: 32.1 % — ABNORMAL LOW (ref 36.0–46.0)
Hemoglobin: 10.1 g/dL — ABNORMAL LOW (ref 12.0–15.0)
Immature Granulocytes: 0 %
Lymphocytes Relative: 36 %
Lymphs Abs: 1.6 10*3/uL (ref 0.7–4.0)
MCH: 29 pg (ref 26.0–34.0)
MCHC: 31.5 g/dL (ref 30.0–36.0)
MCV: 92.2 fL (ref 80.0–100.0)
Monocytes Absolute: 0.4 10*3/uL (ref 0.1–1.0)
Monocytes Relative: 9 %
Neutro Abs: 2.3 10*3/uL (ref 1.7–7.7)
Neutrophils Relative %: 52 %
Platelet Count: 252 10*3/uL (ref 150–400)
RBC: 3.48 MIL/uL — ABNORMAL LOW (ref 3.87–5.11)
RDW: 15.4 % (ref 11.5–15.5)
WBC Count: 4.4 10*3/uL (ref 4.0–10.5)
nRBC: 0 % (ref 0.0–0.2)

## 2021-12-10 MED ORDER — HEPARIN SOD (PORK) LOCK FLUSH 100 UNIT/ML IV SOLN
500.0000 [IU] | Freq: Once | INTRAVENOUS | Status: AC
  Administered 2021-12-10: 500 [IU] via INTRAVENOUS

## 2021-12-10 MED ORDER — SODIUM CHLORIDE 0.9% FLUSH
10.0000 mL | Freq: Once | INTRAVENOUS | Status: AC
  Administered 2021-12-10: 10 mL via INTRAVENOUS

## 2021-12-10 NOTE — Patient Instructions (Signed)

## 2021-12-10 NOTE — Addendum Note (Signed)
Addended by: Fabio Neighbors A on: 12/10/2021 12:27 PM   Modules accepted: Orders

## 2021-12-10 NOTE — Progress Notes (Signed)
Hematology and Oncology Follow Up Visit  Taylor Flynn [id number] 1976/07/04 45 y.o. 12/10/2021   Principle Diagnosis:  Metastatic breast cancer-ER-/HER-2+ -- new CNS mets  Current Therapy:   Herceptin/Perjeta - s/p cycle 17 (previous cycles given in [place]) -- d/c on 11/10/2020 Kadcyla -- start cycle #1 on 11/14/2020 -- d/c on 11/17/2020 Perjeta -- start cycle #1 on 06/13/2021 -- d/c on 08/25/2021 Zometa 4 mg IV q 2 months  --next dose on September 2022  Enhertu -- start cycle #1 on 09/13/2021 --not yet started   Interim History:  Taylor Flynn is here today for follow-up.  We last saw her back in November.  She has been doing okay.  As always, she has been taking her holistic approach to her cancer.  She is on a keto diet right now.  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  She is going down to [place] next week for a conference.  This is a natural healing conference.  She will be taking some angina [place] therapy while she is down there.  I am sure that this will be quite conducive for her overall health.  She is still working part-time.  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  Her appetite is good.  Again she is on a keto diet.  She is a little bit constipated..  She has had no cough.  The left chest wall nodules are enlarging she says.  She has had no bleeding.  She has had no obvious palpable lymph nodes.  She has had a little bit of sacral pain.  She has had little bit of edema in the legs.  Overall, I was a performance status is probably ECOG 1.      Medications:  Allergies as of 12/10/2021       Reactions   Aspirin Swelling   Angioedema    Sulfa Antibiotics Rash        Medication List        Accurate as of December 10, 2021  9:45 AM. If you have any questions, ask your nurse or doctor.           MELATONIN PO Take 150 mg by mouth at bedtime.   NALTREXONE HCL PO Take 4.5 mg by mouth 3 (three) times a week.   NON FORMULARY 2 (two) times daily. Pancreatic enzymes 3 caps AC and 2 caps with meals.   OVER THE COUNTER MEDICATION Vitamin A drops-2 drops daily.   PRESCRIPTION MEDICATION Inject 1 mL into the skin every Monday, Wednesday, and Friday.   Vitamin D 125 MCG (5000 UT) Caps Take by mouth daily.        Allergies:  Allergies  Allergen Reactions   Aspirin Swelling    Angioedema    Sulfa Antibiotics Rash    Past Medical History, Surgical history, Social history, and Family History were reviewed and updated.  Review of Systems: Review of Systems  Constitutional: Negative.   HENT: Negative.    Eyes: Negative.   Respiratory: Negative.    Cardiovascular: Negative.   Gastrointestinal: Negative.   Genitourinary: Negative.   Musculoskeletal: Negative.   Skin: Negative.   Neurological: Negative.   Endo/Heme/Allergies: Negative.   Psychiatric/Behavioral: Negative.      Physical Exam:  height is '5\' 3"'  (1.6 m) and weight is 119  lb 1.3 oz (54 kg). Her oral temperature is 97.8 F (36.6 C). Her blood pressure is 110/60 and her pulse is 68. Her respiration is 18 and oxygen saturation is 100%.   Wt Readings from Last 3 Encounters:  12/10/21 119 lb 1.3 oz (54 kg)  09/07/21 117 lb 1.3 oz (53.1 kg)  06/13/21 118 lb 1.3 oz (53.6 kg)    Physical Exam Vitals reviewed.  Constitutional:      Comments: Her chest wall exam shows the left lumpectomy.  She has multiple nodules now.  There is a relatively large nodule in the center of the left anterior chest wall.  It is firm.  It is not mobile.  It probably measures about a centimeter.  She has other nodules that are open.  She has a dressing over these.   HENT:     Head: Normocephalic and atraumatic.  Eyes:     Pupils: Pupils are equal, round, and reactive to light.  Cardiovascular:     Rate and Rhythm:  Normal rate and regular rhythm.     Heart sounds: Normal heart sounds.  Pulmonary:     Effort: Pulmonary effort is normal.     Breath sounds: Normal breath sounds.  Abdominal:     General: Bowel sounds are normal.     Palpations: Abdomen is soft.  Musculoskeletal:        General: No tenderness or deformity. Normal range of motion.     Cervical back: Normal range of motion.  Lymphadenopathy:     Cervical: No cervical adenopathy.  Skin:    General: Skin is warm and dry.     Findings: No erythema or rash.  Neurological:     Mental Status: She is alert and oriented to person, place, and time.  Psychiatric:        Behavior: Behavior normal.        Thought Content: Thought content normal.        Judgment: Judgment normal.     Lab Results  Component Value Date   WBC 5.7 09/07/2021   HGB 9.7 (L) 09/07/2021   HCT 30.5 (L) 09/07/2021   MCV 92.4 09/07/2021   PLT 259 09/07/2021   Lab Results  Component Value Date   FERRITIN 6 (L) 12/16/2019   IRON 32 (L) 12/16/2019   TIBC 391 12/16/2019   UIBC 359 12/16/2019   IRONPCTSAT 8 (L) 12/16/2019   Lab Results  Component Value Date   RETICCTPCT 0.8 11/17/2018   RBC 3.30 (L) 09/07/2021   No results found for: KPAFRELGTCHN, LAMBDASER, KAPLAMBRATIO No results found for: IGGSERUM, IGA, IGMSERUM No results found for: Taylor Flynn, SPEI   Chemistry      Component Value Date/Time   NA 141 09/07/2021 0933   K 4.0 09/07/2021 0933   CL 104 09/07/2021 0933   CO2 25 09/07/2021 0933   BUN 18 09/07/2021 0933   CREATININE 0.97 09/07/2021 0933      Component Value Date/Time   CALCIUM 9.8 09/07/2021 0933   ALKPHOS 42 09/07/2021 0933   AST 23 09/07/2021 0933   ALT 10 09/07/2021 0933   BILITOT 0.3 09/07/2021 0933       Impression and Plan:   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  She is on her holistic approach for her  treatment.  She knows her body well.  She is very confident that what she is taking  will help her.  She is looking forward to going down to [place] to to try the agent [place] therapies.  Again, I am sure that she will do well with these.  I told her that whenever she would like to start the Enhertu just to give [place] a call.  I am glad that the brain mets look better.  Of note, when I saw her, her tumor marker actually was only 12.1.  I am very happy about this.  We will see what it is today.  We will plan to get her back to see [place] in another 6 weeks or so.  She does have the Port-A-Cath that we have to flush.   [name], MD 2/6/20239:45 AM

## 2021-12-11 LAB — CANCER ANTIGEN 27.29: CA 27.29: 16.8 U/mL (ref 0.0–38.6)

## 2022-01-21 ENCOUNTER — Inpatient Hospital Stay

## 2022-01-21 ENCOUNTER — Other Ambulatory Visit: Payer: Self-pay

## 2022-01-21 ENCOUNTER — Encounter: Payer: Self-pay | Admitting: Hematology & Oncology

## 2022-01-21 ENCOUNTER — Inpatient Hospital Stay: Attending: Hematology & Oncology

## 2022-01-21 ENCOUNTER — Other Ambulatory Visit: Payer: Self-pay | Admitting: *Deleted

## 2022-01-21 ENCOUNTER — Inpatient Hospital Stay (HOSPITAL_BASED_OUTPATIENT_CLINIC_OR_DEPARTMENT_OTHER): Admitting: Hematology & Oncology

## 2022-01-21 VITALS — BP 114/61 | HR 67 | Temp 97.8°F | Resp 18 | Wt 121.0 lb

## 2022-01-21 DIAGNOSIS — C50919 Malignant neoplasm of unspecified site of unspecified female breast: Secondary | ICD-10-CM

## 2022-01-21 DIAGNOSIS — C7931 Secondary malignant neoplasm of brain: Secondary | ICD-10-CM | POA: Insufficient documentation

## 2022-01-21 DIAGNOSIS — C787 Secondary malignant neoplasm of liver and intrahepatic bile duct: Secondary | ICD-10-CM | POA: Diagnosis not present

## 2022-01-21 DIAGNOSIS — C50912 Malignant neoplasm of unspecified site of left female breast: Secondary | ICD-10-CM

## 2022-01-21 DIAGNOSIS — Z452 Encounter for adjustment and management of vascular access device: Secondary | ICD-10-CM | POA: Insufficient documentation

## 2022-01-21 DIAGNOSIS — Z171 Estrogen receptor negative status [ER-]: Secondary | ICD-10-CM | POA: Insufficient documentation

## 2022-01-21 LAB — CBC WITH DIFFERENTIAL (CANCER CENTER ONLY)
Abs Immature Granulocytes: 0.01 10*3/uL (ref 0.00–0.07)
Basophils Absolute: 0 10*3/uL (ref 0.0–0.1)
Basophils Relative: 0 %
Eosinophils Absolute: 0.1 10*3/uL (ref 0.0–0.5)
Eosinophils Relative: 2 %
HCT: 32.7 % — ABNORMAL LOW (ref 36.0–46.0)
Hemoglobin: 10.5 g/dL — ABNORMAL LOW (ref 12.0–15.0)
Immature Granulocytes: 0 %
Lymphocytes Relative: 28 %
Lymphs Abs: 1.3 10*3/uL (ref 0.7–4.0)
MCH: 28.8 pg (ref 26.0–34.0)
MCHC: 32.1 g/dL (ref 30.0–36.0)
MCV: 89.8 fL (ref 80.0–100.0)
Monocytes Absolute: 0.5 10*3/uL (ref 0.1–1.0)
Monocytes Relative: 10 %
Neutro Abs: 2.8 10*3/uL (ref 1.7–7.7)
Neutrophils Relative %: 60 %
Platelet Count: 259 10*3/uL (ref 150–400)
RBC: 3.64 MIL/uL — ABNORMAL LOW (ref 3.87–5.11)
RDW: 14.5 % (ref 11.5–15.5)
WBC Count: 4.6 10*3/uL (ref 4.0–10.5)
nRBC: 0 % (ref 0.0–0.2)

## 2022-01-21 LAB — CMP (CANCER CENTER ONLY)
ALT: 12 U/L (ref 0–44)
AST: 22 U/L (ref 15–41)
Albumin: 4.2 g/dL (ref 3.5–5.0)
Alkaline Phosphatase: 52 U/L (ref 38–126)
Anion gap: 8 (ref 5–15)
BUN: 26 mg/dL — ABNORMAL HIGH (ref 6–20)
CO2: 26 mmol/L (ref 22–32)
Calcium: 9.5 mg/dL (ref 8.9–10.3)
Chloride: 105 mmol/L (ref 98–111)
Creatinine: 0.89 mg/dL (ref 0.44–1.00)
GFR, Estimated: >60 mL/min (ref >60)
Glucose, Bld: 80 mg/dL (ref 70–99)
Potassium: 3.9 mmol/L (ref 3.5–5.1)
Sodium: 139 mmol/L (ref 135–145)
Total Bilirubin: 0.3 mg/dL (ref 0.3–1.2)
Total Protein: 6.7 g/dL (ref 6.5–8.1)

## 2022-01-21 LAB — SEDIMENTATION RATE: Sed Rate: 7 mm/hr (ref 0–22)

## 2022-01-21 LAB — C-REACTIVE PROTEIN: CRP: 0.6 mg/dL (ref <1.0)

## 2022-01-21 LAB — LACTATE DEHYDROGENASE: LDH: 182 U/L (ref 98–192)

## 2022-01-21 MED ORDER — HEPARIN SOD (PORK) LOCK FLUSH 100 UNIT/ML IV SOLN
500.0000 [IU] | Freq: Once | INTRAVENOUS | Status: AC | PRN
  Administered 2022-01-21: 500 [IU]

## 2022-01-21 MED ORDER — SODIUM CHLORIDE 0.9% FLUSH
10.0000 mL | Freq: Once | INTRAVENOUS | Status: AC | PRN
  Administered 2022-01-21: 10 mL

## 2022-01-21 NOTE — Progress Notes (Signed)
?Hematology and Oncology Follow Up Visit ? ?Taylor Flynn ?[id number] ?1976/10/13 46 y.o. ?01/21/2022 ? ? 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 ?Current Therapy:   ?Herceptin/Perjeta - s/p cycle 17 (previous cycles given in [place]) -- d/c on 11/10/2020 ?Kadcyla -- start cycle #1 on 11/14/2020 -- d/c on 11/17/2020 ?Perjeta -- start cycle #1 on 06/13/2021 -- d/c on 08/25/2021 ?Zometa 4 mg IV q 2 months  --next dose on September 2022  ?Herceptin -- 4 mg/kg IV q month ---  start on 01/22/2022 ?  ?Interim History:  Taylor Flynn is here today for follow-up.  It has been a while since we saw her.  We last saw her back in early February.  She has actually been out to the [facility].  She is doing phototherapy.  She has not yet started this but is going to probably get a more local establishment to do phototherapy.  This is by can be done in [place]. ? ?She says that the cutaneous lesions on the left chest wall are getting larger.  She had them covered up when I saw her.  She took the covering off.  She clearly does have progressive disease. ? ?Surprisingly, her tumor markers still have been normal.  Back in February, her CA 27.29 was 17.  She did have an elevated CA 19-9 back in January 44.  We are rechecking this. ? ?She would like to get started back onto Herceptin.  She does not want Enhertu.  We tried her on Kadcyla and she did not like how this made her feel. ? ?I got a says she is still working.  She looks great.  She said her daughter, who is at [facility] had her spring break and was home last week. ? ?There is been no change in bowel or bladder habits.  She has had no leg swelling.  She has had no headache.  She has had no visual changes.  ? ?Her last MRI of the brain was back in January 2023.  Radiation Oncology is managing this aspect of her care.  Thankfully, the tumors were smaller in the cerebellum. ? ?Currently, I would say performance status is probably ECOG 1.    ? ?   Medications:  ?Allergies as of 01/21/2022   ? ?   Reactions  ? Aspirin Swelling  ? Angioedema   ? Sulfa Antibiotics Rash  ? ?  ? ?  ?Medication List  ?  ? ?  ? Accurate as of January 21, 2022  5:15 PM. If you have any questions, ask your nurse or doctor.  ?  ?  ? ?  ? ?MELATONIN PO ?Take 150 mg by mouth at bedtime. ?  ?NALTREXONE HCL PO ?Take 4.5 mg by mouth 3 (three) times a week. ?  ?NON FORMULARY ?2 (two) times daily. Pancreatic enzymes 3 caps AC and 2 caps with meals. ?  ?OVER THE COUNTER MEDICATION ?Vitamin A drops-2 drops daily. ?  ?PRESCRIPTION MEDICATION ?Inject 1 mL into the skin every Monday, Wednesday, and Friday. ?  ?TURMERIC CURCUMIN PO ?Take 1 tablet by mouth daily at 6 (six) AM. ?  ?VITAMIN C PO ?Take 1 tablet by mouth daily at 6 (six) AM. ?  ?Vitamin D 125 MCG (5000 UT) Caps ?Take by mouth daily. ?  ?VITAMIN K2 PO ?Take 1 tablet by mouth daily at 6 (six) AM. ?  ?ZINC PO ?Take 1 tablet by mouth daily at 6 (six) AM. ?  ? ?  ? ? ?  Allergies:  ?Allergies  ?Allergen Reactions  ? Aspirin Swelling  ?  Angioedema   ? Sulfa Antibiotics Rash  ? ? ?Past Medical History, Surgical history, Social history, and Family History were reviewed and updated. ? ?Review of Systems: ?Review of Systems  ?Constitutional: Negative.   ?HENT: Negative.    ?Eyes: Negative.   ?Respiratory: Negative.    ?Cardiovascular: Negative.   ?Gastrointestinal: Negative.   ?Genitourinary: Negative.   ?Musculoskeletal: Negative.   ?Skin: Negative.   ?Neurological: Negative.   ?Endo/Heme/Allergies: Negative.   ?Psychiatric/Behavioral: Negative.    ? ? ?Physical Exam: ? weight is 121 lb (54.9 kg). Her oral temperature is 97.8 ?F (36.6 ?C). Her blood pressure is 114/61 and her pulse is 67. Her respiration is 18 and oxygen saturation is 100%.  ? ?Wt Readings from Last 3 Encounters:  ?01/21/22 121 lb (54.9 kg)  ?01/21/22 121 lb (54.9 kg)  ?12/10/21 119 lb 1.3 oz (54 kg)  ? ? ?Physical Exam ?Vitals reviewed.  ?Constitutional:   ?   Comments: Her chest  wall exam shows the left lumpectomy.  She has multiple nodules now.  There is a relatively large nodule in the center of the left anterior chest wall.  It is firm.  It is not mobile.  It probably measures about a centimeter.  She has other nodules that are open.  She has a dressing over these.   ?HENT:  ?   Head: Normocephalic and atraumatic.  ?Eyes:  ?   Pupils: Pupils are equal, round, and reactive to light.  ?Cardiovascular:  ?   Rate and Rhythm: Normal rate and regular rhythm.  ?   Heart sounds: Normal heart sounds.  ?Pulmonary:  ?   Effort: Pulmonary effort is normal.  ?   Breath sounds: Normal breath sounds.  ?Abdominal:  ?   General: Bowel sounds are normal.  ?   Palpations: Abdomen is soft.  ?Musculoskeletal:     ?   General: No tenderness or deformity. Normal range of motion.  ?   Cervical back: Normal range of motion.  ?Lymphadenopathy:  ?   Cervical: No cervical adenopathy.  ?Skin: ?   General: Skin is warm and dry.  ?   Findings: No erythema or rash.  ?Neurological:  ?   Mental Status: She is alert and oriented to person, place, and time.  ?Psychiatric:     ?   Behavior: Behavior normal.     ?   Thought Content: Thought content normal.     ?   Judgment: Judgment normal.  ? ? ? ?Lab Results  ?Component Value Date  ? WBC 4.6 01/21/2022  ? HGB 10.5 (L) 01/21/2022  ? HCT 32.7 (L) 01/21/2022  ? MCV 89.8 01/21/2022  ? PLT 259 01/21/2022  ? ?Lab Results  ?Component Value Date  ? FERRITIN 6 (L) 12/16/2019  ? IRON 32 (L) 12/16/2019  ? TIBC 391 12/16/2019  ? UIBC 359 12/16/2019  ? IRONPCTSAT 8 (L) 12/16/2019  ? ?Lab Results  ?Component Value Date  ? RETICCTPCT 0.8 11/17/2018  ? RBC 3.64 (L) 01/21/2022  ? ?No results found for: KPAFRELGTCHN, LAMBDASER, KAPLAMBRATIO ?No results found for: IGGSERUM, IGA, IGMSERUM ?No results found for: TOTALPROTELP, ALBUMINELP, A1GS, A2GS, BETS, BETA2SER, GAMS, MSPIKE, SPEI ?  Chemistry   ?   ?Component Value Date/Time  ? NA 139 01/21/2022 1010  ? K 3.9 01/21/2022 1010  ? CL 105  01/21/2022 1010  ? CO2 26 01/21/2022 1010  ? BUN 26 (H) 01/21/2022  1010  ? CREATININE 0.89 01/21/2022 1010  ?    ?Component Value Date/Time  ? CALCIUM 9.5 01/21/2022 1010  ? ALKPHOS 52 01/21/2022 1010  ? AST 22 01/21/2022 1010  ? ALT 12 01/21/2022 1010  ? BILITOT 0.3 01/21/2022 1010  ?  ? ? ? ?Impression and Plan:  ? ?Taylor Flynn is a pleasant 46 yo African American female with a history of metastatic breast cancer.  She has been very focused on how she would like to have her breast cancer treated.  She thinks that phototherapy is going to be helpful for her right now.  She thinks that she will have this in April. ? ?She wants to have Herceptin.  We will get Herceptin set up for her this week. ? ?We will have to see what her tumor markers show. ? ?I know that she has been somewhat challenging with how she is able to get in here.  She says that she probably would have her next dose of Herceptin in late April. ? ?Hopefully, phototherapy will help her out.  Again know that these tumors on the chest wall on her left chest are more prominent and certainly seems to have more invasion into the chest wall tissue. ? ?I will set up a follow-up appointment for her in about a month or so. ? ?She wants a reduced dose of Herceptin.  She just feels this is the right dose for her.  ? ?[name], MD ?3/20/20235:15 PM ?

## 2022-01-21 NOTE — Patient Instructions (Signed)

## 2022-01-22 LAB — CANCER ANTIGEN 19-9: CA 19-9: 44 U/mL — ABNORMAL HIGH (ref 0–35)

## 2022-01-22 LAB — CANCER ANTIGEN 27.29: CA 27.29: 20 U/mL (ref 0.0–38.6)

## 2022-01-24 ENCOUNTER — Inpatient Hospital Stay

## 2022-01-24 LAB — CHROMOGRANIN A: Chromogranin A (ng/mL): 135.7 ng/mL — ABNORMAL HIGH (ref 0.0–101.8)

## 2022-01-28 ENCOUNTER — Other Ambulatory Visit: Payer: Self-pay | Admitting: *Deleted

## 2022-01-28 DIAGNOSIS — C50919 Malignant neoplasm of unspecified site of unspecified female breast: Secondary | ICD-10-CM

## 2022-01-28 DIAGNOSIS — Z171 Estrogen receptor negative status [ER-]: Secondary | ICD-10-CM

## 2022-01-28 DIAGNOSIS — C50912 Malignant neoplasm of unspecified site of left female breast: Secondary | ICD-10-CM

## 2022-01-29 ENCOUNTER — Other Ambulatory Visit

## 2022-01-29 ENCOUNTER — Ambulatory Visit

## 2022-02-04 ENCOUNTER — Ambulatory Visit: Payer: Medicare Other | Admitting: Radiology

## 2022-02-07 ENCOUNTER — Ambulatory Visit (INDEPENDENT_AMBULATORY_CARE_PROVIDER_SITE_OTHER): Payer: Medicare Other | Admitting: Radiology

## 2022-02-07 ENCOUNTER — Encounter: Payer: Self-pay | Admitting: Radiology

## 2022-02-07 VITALS — BP 100/62 | Ht 63.5 in | Wt 119.0 lb

## 2022-02-07 DIAGNOSIS — C50912 Malignant neoplasm of unspecified site of left female breast: Secondary | ICD-10-CM

## 2022-02-07 DIAGNOSIS — Z9189 Other specified personal risk factors, not elsewhere classified: Secondary | ICD-10-CM | POA: Diagnosis not present

## 2022-02-07 DIAGNOSIS — Z01419 Encounter for gynecological examination (general) (routine) without abnormal findings: Secondary | ICD-10-CM

## 2022-02-07 DIAGNOSIS — C792 Secondary malignant neoplasm of skin: Secondary | ICD-10-CM

## 2022-02-07 DIAGNOSIS — Z113 Encounter for screening for infections with a predominantly sexual mode of transmission: Secondary | ICD-10-CM

## 2022-02-07 NOTE — Progress Notes (Signed)
? ?Taylor Flynn 04/07/1976 [id number] ? ? ?History: Postmenopausal 46 y.o. presents for annual exam. Stage 4 breast cancer hx of left mastectomy. Had mets to brain and liver which were well controlled, now struggling with mets to skin. She is seeking alternative medicine treatments for this and is traveling to [place] tomorrow for a skin treatment. Desires STI screen, partner was unfaithful, they are no longer together. ? ? ?Gynecologic History ?Postmenopausal ?Last Pap: 2/22. Results were: normal ?Last mammogram: 2017. Results were: abnormal ?Last colonoscopy: never ?HRT use: never ? ?Obstetric History ?OB History  ?Gravida Para Term Preterm AB Living  ?'5 2 2 '$ 0 3 2  ?SAB IAB Ectopic Multiple Live Births  ?2 1 0 0 2  ?  ?# Outcome Date GA Lbr Len/2nd Weight Sex Delivery Anes PTL Lv  ?5 Term 04/16/11    F CS-Unspec   LIV  ?4 Term 11/04/03    F Vag-Spont   LIV  ?3 IAB           ?2 SAB           ?1 SAB           ? ? ? ?The following portions of the patient's history were reviewed and updated as appropriate: allergies, current medications, past family history, past medical history, past social history, past surgical history, and problem list. ? ?Review of Systems ?Pertinent items noted in HPI and remainder of comprehensive ROS otherwise negative.  ?Past medical history, past surgical history, family history and social history were all reviewed and documented in the EPIC chart. ? ?Exam: ? ?Vitals:  ? 02/07/22 1029  ?BP: 100/62  ?Weight: 119 lb (54 kg)  ?Height: 5' 3.5" (1.613 m)  ? ?Body mass index is 20.75 kg/m?. ? ?General appearance:  Normal ?Thyroid:  Symmetrical, normal in size, without palpable masses or nodularity. ?Respiratory ? Auscultation:  Clear without wheezing or rhonchi ?Cardiovascular ? Auscultation:  Regular rate, without rubs, murmurs or gallops ? Edema/varicosities:  Not grossly evident ?Abdominal ? Soft,nontender, without masses, guarding or rebound. ? Liver/spleen:  No organomegaly noted ? Hernia:  None  appreciated ? Skin ? Inspection:  Grossly normal ?Breasts: Examined lying and sitting.  ? Right: Without masses, retractions, nipple discharge or axillary adenopathy. ? ? Left: mastectomy ?Genitourinary  ? Inguinal/mons:  Normal without inguinal adenopathy ? External genitalia:  Normal appearing vulva with no masses, tenderness, or lesions ? BUS/Urethra/Skene's glands:  Normal ? Vagina:  Normal appearing with normal color and discharge, no lesions.   ? Cervix:  Normal appearing without discharge or lesions ? Uterus:  Normal in size, shape and contour.  Midline and mobile, nontender ? Adnexa/parametria:   ?  Rt: Normal in size, without masses or tenderness. ?  Lt: Normal in size, without masses or tenderness. ? Anus and perineum: Normal ?  ? ?Patient informed chaperone available to be present for breast and pelvic exam. Patient has requested no chaperone to be present. Patient has been advised what will be completed during breast and pelvic exam.  ? ?Assessment/Plan:   ?1. Well woman exam with routine gynecological exam ?Pap 2025 ? ?2. Screening examination for STD (sexually transmitted disease) ? ?- SURESWAB CT/NG/T. vaginalis ?- RPR ?- Hepatitis C antibody ? ?3. 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 ?Discussed SBE, colonoscopy and DEXA screening as directed. Recommend 156mns of exercise weekly, including weight bearing exercise. Encouraged the use of seatbelts and sunscreen.  ?Return in 1 year for annual or sooner prn. ? ?[place]  Baily Hovanec B WHNP-BC, 11:09 AM 02/07/2022  ?

## 2022-02-08 LAB — HEPATITIS C ANTIBODY
Hepatitis C Ab: NONREACTIVE
SIGNAL TO CUT-OFF: 0.04 (ref <1.00)

## 2022-02-08 LAB — RPR: RPR Ser Ql: NONREACTIVE

## 2022-02-09 LAB — SURESWAB CT/NG/T. VAGINALIS
C. trachomatis RNA, TMA: NOT DETECTED
N. gonorrhoeae RNA, TMA: NOT DETECTED
Trichomonas vaginalis RNA: NOT DETECTED

## 2022-02-14 ENCOUNTER — Other Ambulatory Visit

## 2022-02-14 ENCOUNTER — Inpatient Hospital Stay

## 2022-02-14 ENCOUNTER — Ambulatory Visit

## 2022-03-07 ENCOUNTER — Other Ambulatory Visit

## 2022-03-07 ENCOUNTER — Ambulatory Visit: Admitting: Hematology & Oncology

## 2022-03-07 ENCOUNTER — Ambulatory Visit

## 2022-03-09 ENCOUNTER — Telehealth: Payer: Medicare Other | Admitting: Nurse Practitioner

## 2022-03-09 DIAGNOSIS — R5383 Other fatigue: Secondary | ICD-10-CM

## 2022-03-09 NOTE — Progress Notes (Signed)
? ?Virtual Visit Consent  ? ?Taylor Flynn, you are scheduled for a virtual visit with Mary-Margaret Hassell Done, [place], a [facility] provider, today.   ?  ?Just as with appointments in the office, your consent must be obtained to participate.  Your consent will be active for this visit and any virtual visit you may have with one of our providers in the next 365 days.   ?  ?If you have a MyChart account, a copy of this consent can be sent to you electronically.  All virtual visits are billed to your insurance company just like a traditional visit in the office.   ? ?As this is a virtual visit, video technology does not allow for your provider to perform a traditional examination.  This may limit your provider's ability to fully assess your condition.  If your provider identifies any concerns that need to be evaluated in person or the need to arrange testing (such as labs, EKG, etc.), we will make arrangements to do so.   ?  ?Although advances in technology are sophisticated, we cannot ensure that it will always work on either your end or our end.  If the connection with a video visit is poor, the visit may have to be switched to a telephone visit.  With either a video or telephone visit, we are not always able to ensure that we have a secure connection.    ? ?I need to obtain your verbal consent now.   Are you willing to proceed with your visit today? YES ?  ?Taylor Flynn has provided verbal consent on 03/09/2022 for a virtual visit (video or telephone). ?  ?Mary-Margaret Hassell Done, FNP  ? ?Date: 03/09/2022 1:23 PM ? ? ?Virtual Visit via Video Note  ? ?I, Mary-Margaret Hassell Done, connected with Taylor Flynn ([id number], 26-Mar-1976) on 03/09/22 at  1:15 PM EDT by a video-enabled telemedicine application and verified that I am speaking with the correct person using two identifiers. ? ?Location: ?Patient: Virtual Visit Location Patient: Mobile ?Provider: Virtual Visit Location Provider: Mobile ?  ?I discussed the limitations of  evaluation and management by telemedicine and the availability of in person appointments. The patient expressed understanding and agreed to proceed.   ? ?History of Present Illness: ?Taylor Flynn is a 46 y.o. who identifies as a female who was assigned female at birth, and is being seen today for exhaustion.. ? 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 She had to move her daughter yesterday and did not get  in the bed until 4AM this morning. She has a 7 year history of breast cancer and is under a new treatment with makes her tired. That being said on top of being up late last night, she is not able to hardly move, much less do her drill duties safely this weekend.She needs a note  to be excuse from drill.  ?  ?Review of Systems  ?Constitutional:  Positive for malaise/fatigue.  ?Neurological:  Positive for weakness.  ? ?Problems:  ?Patient Active Problem List  ? Diagnosis Date Noted  ? Brain metastases 11/13/2020  ? Goals of care, counseling/discussion 11/13/2020  ? Breast cancer metastasized to liver, unspecified laterality ([place]) 10/08/2018  ? 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, left ([place]) 10/08/2018  ? Miscarriage 43/12/2017  ? Multigravida of advanced maternal age 43/12/2017  ? Pruritus of vagina 10/05/2018  ? 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 Angio-edema 09/25/2018  ? Malignant neoplasm of overlapping sites of left breast in  female, estrogen receptor negative ([place]) 10/21/2017  ?  ?Allergies:  ?Allergies  ?Allergen Reactions  ? Aspirin Swelling  ?  Angioedema   ? Sulfa Antibiotics Rash  ? ?Medications:  ?Current Outpatient Medications:  ?  Ascorbic Acid (VITAMIN C PO), Take 1 tablet by mouth daily at 6 (six) AM., Disp: , Rfl:  ?  Cholecalciferol (VITAMIN D) 125 MCG (5000 UT) CAPS, Take by mouth daily., Disp: , Rfl:  ?  MELATONIN PO, Take 150 mg by mouth at bedtime., Disp: , Rfl:  ?  Menaquinone-7 (VITAMIN K2 PO), Take 1 tablet by mouth daily at 6 (six) AM., Disp: , Rfl:   ?  Multiple Vitamins-Minerals (ZINC PO), Take 1 tablet by mouth daily at 6 (six) AM., Disp: , Rfl:  ?  NALTREXONE HCL PO, Take 4.5 mg by mouth 3 (three) times a week., Disp: , Rfl:  ?  NON FORMULARY, 2 (two) times daily. Pancreatic enzymes 3 caps AC and 2 caps with meals., Disp: , Rfl:  ?  OVER THE COUNTER MEDICATION, Vitamin A drops-2 drops daily., Disp: , Rfl:  ?  PRESCRIPTION MEDICATION, Inject 1 mL into the skin every Monday, Wednesday, and Friday., Disp: , Rfl:  ?  TURMERIC CURCUMIN PO, Take 1 tablet by mouth daily at 6 (six) AM., Disp: , Rfl:  ? ?Observations/Objective: ?Patient is well-developed, well-nourished in no acute distress.  ?Resting comfortably  at home.  ?Head is normocephalic, atraumatic.  ?No labored breathing.  ?Speech is clear and coherent with logical content.  ?Patient is alert and oriented at baseline.  ? ? ?Assessment and Plan: ? ?Taylor Flynn in today with chief complaint of Heat Exposure ? ? ?1. Extreme exhaustion ?Rest  ?Force fluids ?Contact if needed ? ? ?Follow Up Instructions: ?I discussed the assessment and treatment plan with the patient. The patient was provided an opportunity to ask questions and all were answered. The patient agreed with the plan and demonstrated an understanding of the instructions.  A copy of instructions were sent to the patient via MyChart. ? ?The patient was advised to call back or seek an in-person evaluation if the symptoms worsen or if the condition fails to improve as anticipated. ? ?Time:  ?I spent 12 minutes with the patient via telehealth technology discussing the above problems/concerns.   ? ?Mary-Margaret Hassell Done, FNP ? ?

## 2022-03-09 NOTE — Patient Instructions (Signed)
Fatigue ?If you have fatigue, you feel tired all the time and have a lack of energy or a lack of motivation. Fatigue may make it difficult to start or complete tasks because of exhaustion. ?Occasional or mild fatigue is often a normal response to activity or life. However, long-term (chronic) or extreme fatigue may be a symptom of a medical condition such as: ?Depression. ?Not having enough red blood cells or hemoglobin in the blood (anemia). ?A problem with a small gland located in the lower front part of the neck (thyroid disorder). ?Rheumatologic conditions. These are problems related to the body's defense system (immune system). ?Infections, especially certain viral infections. ?Fatigue can also lead to negative health outcomes over time. ?Follow these instructions at home: ?Medicines ?Take over-the-counter and prescription medicines only as told by your health care provider. ?Take a multivitamin if told by your health care provider. ?Do not use herbal or dietary supplements unless they are approved by your health care provider. ?Eating and drinking ? ?Avoid heavy meals in the evening. ?Eat a well-balanced diet, which includes lean proteins, whole grains, plenty of fruits and vegetables, and low-fat dairy products. ?Avoid eating or drinking too many products with caffeine in them. ?Avoid alcohol. ?Drink enough fluid to keep your urine pale yellow. ?Activity ? ?Exercise regularly, as told by your health care provider. ?Use or practice techniques to help you relax, such as yoga, tai chi, meditation, or massage therapy. ?Lifestyle ?Change situations that cause you stress. Try to keep your work and personal schedules in balance. ?Do not use recreational or illegal drugs. ?General instructions ?Monitor your fatigue for any changes. ?Go to bed and get up at the same time every day. ?Avoid fatigue by pacing yourself during the day and getting enough sleep at night. ?Maintain a healthy weight. ?Contact a health care  provider if: ?Your fatigue does not get better. ?You have a fever. ?You suddenly lose or gain weight. ?You have headaches. ?You have trouble falling asleep or sleeping through the night. ?You feel angry, guilty, anxious, or sad. ?You have swelling in your legs or another part of your body. ?Get help right away if: ?You feel confused, feel like you might faint, or faint. ?Your vision is blurry or you have a severe headache. ?You have severe pain in your abdomen, your back, or the area between your waist and hips (pelvis). ?You have chest pain, shortness of breath, or an irregular or fast heartbeat. ?You are unable to urinate, or you urinate less than normal. ?You have abnormal bleeding from the rectum, nose, lungs, nipples, or, if you are female, the vagina. ?You vomit blood. ?You have thoughts about hurting yourself or others. ?These symptoms may be an emergency. Get help right away. Call 911. ?Do not wait to see if the symptoms will go away. ?Do not drive yourself to the hospital. ?Get help right away if you feel like you may hurt yourself or others, or have thoughts about taking your own life. Go to your nearest emergency room or: ?Call 911. ?Call the National Suicide Prevention Lifeline at 1-800-273-8255 or 988. This is open 24 hours a day. ?Text the Crisis Text Line at 741741. ?Summary ?If you have fatigue, you feel tired all the time and have a lack of energy or a lack of motivation. ?Fatigue may make it difficult to start or complete tasks because of exhaustion. ?Long-term (chronic) or extreme fatigue may be a symptom of a medical condition. ?Exercise regularly, as told by your health care provider. ?  Change situations that cause you stress. Try to keep your work and personal schedules in balance. ?This information is not intended to replace advice given to you by your health care provider. Make sure you discuss any questions you have with your health care provider. ?Document Revised: 08/13/2021 Document  Reviewed: 08/13/2021 ?Elsevier Patient Education ? 2023 Else[employer] Inc[employer] ? ?

## 2022-03-14 ENCOUNTER — Inpatient Hospital Stay (HOSPITAL_BASED_OUTPATIENT_CLINIC_OR_DEPARTMENT_OTHER): Payer: Medicare Other | Admitting: Hematology & Oncology

## 2022-03-14 ENCOUNTER — Encounter: Payer: Self-pay | Admitting: Hematology & Oncology

## 2022-03-14 ENCOUNTER — Inpatient Hospital Stay: Payer: Medicare Other

## 2022-03-14 ENCOUNTER — Inpatient Hospital Stay: Payer: Medicare Other | Attending: Hematology & Oncology

## 2022-03-14 VITALS — Wt 117.0 lb

## 2022-03-14 DIAGNOSIS — C50919 Malignant neoplasm of unspecified site of unspecified female breast: Secondary | ICD-10-CM

## 2022-03-14 DIAGNOSIS — Z171 Estrogen receptor negative status [ER-]: Secondary | ICD-10-CM

## 2022-03-14 DIAGNOSIS — C787 Secondary malignant neoplasm of liver and intrahepatic bile duct: Secondary | ICD-10-CM | POA: Diagnosis not present

## 2022-03-14 DIAGNOSIS — C50812 Malignant neoplasm of overlapping sites of left female breast: Secondary | ICD-10-CM | POA: Diagnosis present

## 2022-03-14 DIAGNOSIS — Z923 Personal history of irradiation: Secondary | ICD-10-CM | POA: Insufficient documentation

## 2022-03-14 DIAGNOSIS — C50912 Malignant neoplasm of unspecified site of left female breast: Secondary | ICD-10-CM

## 2022-03-14 DIAGNOSIS — C7931 Secondary malignant neoplasm of brain: Secondary | ICD-10-CM | POA: Insufficient documentation

## 2022-03-14 LAB — CMP (CANCER CENTER ONLY)
ALT: 11 U/L (ref 0–44)
AST: 22 U/L (ref 15–41)
Albumin: 4.3 g/dL (ref 3.5–5.0)
Alkaline Phosphatase: 53 U/L (ref 38–126)
Anion gap: 10 (ref 5–15)
BUN: 21 mg/dL — ABNORMAL HIGH (ref 6–20)
CO2: 26 mmol/L (ref 22–32)
Calcium: 9.7 mg/dL (ref 8.9–10.3)
Chloride: 102 mmol/L (ref 98–111)
Creatinine: 0.89 mg/dL (ref 0.44–1.00)
GFR, Estimated: >60 mL/min (ref >60)
Glucose, Bld: 72 mg/dL (ref 70–99)
Potassium: 4 mmol/L (ref 3.5–5.1)
Sodium: 138 mmol/L (ref 135–145)
Total Bilirubin: 0.3 mg/dL (ref 0.3–1.2)
Total Protein: 6.8 g/dL (ref 6.5–8.1)

## 2022-03-14 LAB — CBC WITH DIFFERENTIAL (CANCER CENTER ONLY)
Abs Immature Granulocytes: 0.01 10*3/uL (ref 0.00–0.07)
Basophils Absolute: 0 10*3/uL (ref 0.0–0.1)
Basophils Relative: 1 %
Eosinophils Absolute: 0.1 10*3/uL (ref 0.0–0.5)
Eosinophils Relative: 2 %
HCT: 33.8 % — ABNORMAL LOW (ref 36.0–46.0)
Hemoglobin: 10.8 g/dL — ABNORMAL LOW (ref 12.0–15.0)
Immature Granulocytes: 0 %
Lymphocytes Relative: 27 %
Lymphs Abs: 1.4 10*3/uL (ref 0.7–4.0)
MCH: 28.4 pg (ref 26.0–34.0)
MCHC: 32 g/dL (ref 30.0–36.0)
MCV: 88.9 fL (ref 80.0–100.0)
Monocytes Absolute: 0.4 10*3/uL (ref 0.1–1.0)
Monocytes Relative: 8 %
Neutro Abs: 3.2 10*3/uL (ref 1.7–7.7)
Neutrophils Relative %: 62 %
Platelet Count: 253 10*3/uL (ref 150–400)
RBC: 3.8 MIL/uL — ABNORMAL LOW (ref 3.87–5.11)
RDW: 14.6 % (ref 11.5–15.5)
WBC Count: 5.1 10*3/uL (ref 4.0–10.5)
nRBC: 0 % (ref 0.0–0.2)

## 2022-03-14 LAB — LACTATE DEHYDROGENASE: LDH: 195 U/L — ABNORMAL HIGH (ref 98–192)

## 2022-03-14 MED ORDER — HEPARIN SOD (PORK) LOCK FLUSH 100 UNIT/ML IV SOLN
500.0000 [IU] | Freq: Once | INTRAVENOUS | Status: AC | PRN
  Administered 2022-03-14: 500 [IU]

## 2022-03-14 MED ORDER — SODIUM CHLORIDE 0.9% FLUSH
10.0000 mL | Freq: Once | INTRAVENOUS | Status: AC | PRN
  Administered 2022-03-14: 10 mL

## 2022-03-14 MED ORDER — ZOLEDRONIC ACID 4 MG/100ML IV SOLN
4.0000 mg | Freq: Once | INTRAVENOUS | Status: AC
  Administered 2022-03-14: 4 mg via INTRAVENOUS
  Filled 2022-03-14: qty 100

## 2022-03-14 MED ORDER — SODIUM CHLORIDE 0.9 % IV SOLN: INTRAVENOUS | Status: DC

## 2022-03-14 NOTE — Progress Notes (Signed)
?Hematology and Oncology Follow Up Visit ? ?Taylor Flynn ?[id number] ?01-19-1976 46 y.o. ?03/14/2022 ? ? 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 ?Current Therapy:   ?Herceptin/Perjeta - s/p cycle 17 (previous cycles given in [place]) -- d/c on 11/10/2020 ? ?Zometa 4 mg IV q 6 months  --next dose on November 2023   ?Herceptin -- 4 mg/kg IV q month ---  start on 01/22/2022 ?  ?Interim History:  Taylor Flynn is here today for follow-up.  We last saw her back in March.  At that time, she was thinking about photodynamic therapy.  She actually had this.  She went over to [place] for this.  This was back in April.  She had I think 6 treatments or so.  She looks like she may have responded.  I took a look at the left chest wall where she has her subcutaneous recurrences.  They certainly look flat. ? ?She feels well otherwise.  She is working.  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 ? ?She has had no problems with headache.  She is being followed by Radiation Oncology for her CNS metastasis. ? ?She has had no problems with pain.  There is no cough or shortness of breath.  She has had no nausea or vomiting.  She has had no change in bowel or bladder habits. ? ?Surprising, her tumor markers avoids been relatively normal for breast cancer.  Back in March, her CA 27.29 was 20.  However, her CA 19-9 was elevated at 44.  The Chromogranin A was 136. ? ?I would have said that at the present time, her performance status is ECOG 1. ?  ? ?  Medications:  ?Allergies as of 03/14/2022   ? ?   Reactions  ? Aspirin Swelling  ? Angioedema   ? Sulfa Antibiotics Rash  ? ?  ? ?  ?Medication List  ?  ? ?  ? Accurate as of Mar 14, 2022 12:13 PM. If you have any questions, ask your nurse or doctor.  ?  ?  ? ?  ? ?MELATONIN PO ?Take 150 mg by mouth at bedtime. ?  ?NALTREXONE HCL PO ?Take 4.5 mg by mouth 3 (three) times a week. ?  ?NON FORMULARY ?2 (two) times daily. Pancreatic enzymes 3 caps AC and 2 caps with  meals. ?  ?OVER THE COUNTER MEDICATION ?Vitamin A drops-2 drops daily. ?  ?PRESCRIPTION MEDICATION ?Inject 1 mL into the skin every Monday, Wednesday, and Friday. mistletoe ?  ?TURMERIC CURCUMIN PO ?Take 1 tablet by mouth daily at 6 (six) AM. ?  ?VITAMIN C PO ?Take 1 tablet by mouth daily at 6 (six) AM. ?  ?Vitamin D 125 MCG (5000 UT) Caps ?Take by mouth daily. ?  ?VITAMIN K2 PO ?Take 1 tablet by mouth daily at 6 (six) AM. ?  ?ZINC PO ?Take 1 tablet by mouth daily at 6 (six) AM. ?  ? ?  ? ? ?Allergies:  ?Allergies  ?Allergen Reactions  ? Aspirin Swelling  ?  Angioedema   ? Sulfa Antibiotics Rash  ? ? ?Past Medical History, Surgical history, Social history, and Family History were reviewed and updated. ? ?Review of Systems: ?Review of Systems  ?Constitutional: Negative.   ?HENT: Negative.    ?Eyes: Negative.   ?Respiratory: Negative.    ?Cardiovascular: Negative.   ?Gastrointestinal: Negative.   ?Genitourinary: Negative.   ?Musculoskeletal: Negative.   ?Skin: Negative.   ?Neurological: Negative.   ?Endo/Heme/Allergies: Negative.   ?  Psychiatric/Behavioral: Negative.    ? ? ?Physical Exam: ? weight is 117 lb (53.1 kg).  ? ?Wt Readings from Last 3 Encounters:  ?03/14/22 117 lb (53.1 kg)  ?02/07/22 119 lb (54 kg)  ?01/21/22 121 lb (54.9 kg)  ? ? ?Physical Exam ?Vitals reviewed.  ?Constitutional:   ?   Comments: Her chest wall exam shows the left lumpectomy.  She has multiple nodules now.  There is a relatively large nodule in the center of the left anterior chest wall.  It is firm.  It is not mobile.  It probably measures about a centimeter.  She has other nodules that are open.  She has a dressing over these.   ?HENT:  ?   Head: Normocephalic and atraumatic.  ?Eyes:  ?   Pupils: Pupils are equal, round, and reactive to light.  ?Cardiovascular:  ?   Rate and Rhythm: Normal rate and regular rhythm.  ?   Heart sounds: Normal heart sounds.  ?Pulmonary:  ?   Effort: Pulmonary effort is normal.  ?   Breath sounds: Normal  breath sounds.  ?Abdominal:  ?   General: Bowel sounds are normal.  ?   Palpations: Abdomen is soft.  ?Musculoskeletal:     ?   General: No tenderness or deformity. Normal range of motion.  ?   Cervical back: Normal range of motion.  ?Lymphadenopathy:  ?   Cervical: No cervical adenopathy.  ?Skin: ?   General: Skin is warm and dry.  ?   Findings: No erythema or rash.  ?Neurological:  ?   Mental Status: She is alert and oriented to person, place, and time.  ?Psychiatric:     ?   Behavior: Behavior normal.     ?   Thought Content: Thought content normal.     ?   Judgment: Judgment normal.  ? ? ? ?Lab Results  ?Component Value Date  ? WBC 5.1 03/14/2022  ? HGB 10.8 (L) 03/14/2022  ? HCT 33.8 (L) 03/14/2022  ? MCV 88.9 03/14/2022  ? PLT 253 03/14/2022  ? ?Lab Results  ?Component Value Date  ? FERRITIN 6 (L) 12/16/2019  ? IRON 32 (L) 12/16/2019  ? TIBC 391 12/16/2019  ? UIBC 359 12/16/2019  ? IRONPCTSAT 8 (L) 12/16/2019  ? ?Lab Results  ?Component Value Date  ? RETICCTPCT 0.8 11/17/2018  ? RBC 3.80 (L) 03/14/2022  ? ?No results found for: KPAFRELGTCHN, LAMBDASER, KAPLAMBRATIO ?No results found for: IGGSERUM, IGA, IGMSERUM ?No results found for: TOTALPROTELP, ALBUMINELP, A1GS, A2GS, BETS, BETA2SER, GAMS, MSPIKE, SPEI ?  Chemistry   ?   ?Component Value Date/Time  ? NA 138 03/14/2022 0949  ? K 4.0 03/14/2022 0949  ? CL 102 03/14/2022 0949  ? CO2 26 03/14/2022 0949  ? BUN 21 (H) 03/14/2022 0949  ? CREATININE 0.89 03/14/2022 0949  ?    ?Component Value Date/Time  ? CALCIUM 9.7 03/14/2022 0949  ? ALKPHOS 53 03/14/2022 0949  ? AST 22 03/14/2022 0949  ? ALT 11 03/14/2022 0949  ? BILITOT 0.3 03/14/2022 0949  ?  ? ? ? ?Impression and Plan:  ? ?Taylor Flynn is a pleasant 46 yo African American female with a history of metastatic breast cancer.  ? ?I am glad that she had the photodynamic therapy.  Hopefully, this has helped. ? ?We will set her up with a PET scan to see how everything looks. ? ?She will get her Herceptin today.  We  will also give her Zometa today.  She does Zometa every 6 months. ? ?I am just happy that her quality of life is doing so well. ? ?I will plan to get her back in another 3 weeks or so. ? ?It will be very interesting to see what the PET scan shows. ? ? ?[name], MD ?5/11/202312:13 PM ?

## 2022-03-14 NOTE — Patient Instructions (Signed)
[facility]  Discharge Instructions: ?Thank you for choosing [facility] to provide your oncology and hematology care.  ? ?If you have a lab appointment with the Cancer Center, please go directly to the Cancer Center and check in at the registration area. ? ?Wear comfortable clothing and clothing appropriate for easy access to any Portacath or PICC line.  ? ?We strive to give you quality time with your provider. You may need to reschedule your appointment if you arrive late (15 or more minutes).  Arriving late affects you and other patients whose appointments are after yours.  Also, if you miss three or more appointments without notifying the office, you may be dismissed from the clinic at the provider?s discretion.    ?  ?For prescription refill requests, have your pharmacy contact our office and allow 72 hours for refills to be completed.   ? ?Today you received the following chemotherapy and/or immunotherapy agents Zometa. ?  ?To help prevent nausea and vomiting after your treatment, we encourage you to take your nausea medication as directed. ? ?BELOW ARE SYMPTOMS THAT SHOULD BE REPORTED IMMEDIATELY: ?*FEVER GREATER THAN 100.4 F (38 ?C) OR HIGHER ?*CHILLS OR SWEATING ?*NAUSEA AND VOMITING THAT IS NOT CONTROLLED WITH YOUR NAUSEA MEDICATION ?*UNUSUAL SHORTNESS OF BREATH ?*UNUSUAL BRUISING OR BLEEDING ?*URINARY PROBLEMS (pain or burning when urinating, or frequent urination) ?*BOWEL PROBLEMS (unusual diarrhea, constipation, pain near the anus) ?TENDERNESS IN MOUTH AND THROAT WITH OR WITHOUT PRESENCE OF ULCERS (sore throat, sores in mouth, or a toothache) ?UNUSUAL RASH, SWELLING OR PAIN  ?UNUSUAL VAGINAL DISCHARGE OR ITCHING  ? ?Items with * indicate a potential emergency and should be followed up as soon as possible or go to the Emergency Department if any problems should occur. ? ?Please show the CHEMOTHERAPY ALERT CARD or IMMUNOTHERAPY ALERT CARD at check-in to the  Emergency Department and triage nurse. ?Should you have questions after your visit or need to cancel or reschedule your appointment, please contact [facility]  [phone] and follow the prompts.  Office hours are 8:00 a.m. to 4:30 p.m. Monday - Friday. Please note that voicemails left after 4:00 p.m. may not be returned until the following business day.  We are closed weekends and major holidays. You have access to a nurse at all times for urgent questions. Please call the main number to the clinic [phone] and follow the prompts. ? ?For any non-urgent questions, you may also contact your provider using MyChart. We now offer e-Visits for anyone 18 and older to request care online for non-urgent symptoms. For details visit [email or web address]. ?  ?Also download the MyChart app! Go to the app store, search "MyChart", open the app, select [facility], and log in with your MyChart username and password. ? ?Due to Covid, a mask is required upon entering the hospital/clinic. If you do not have a mask, one will be given to you upon arrival. For doctor visits, patients may have 1 support person aged 18 or older with them. For treatment visits, patients cannot have anyone with them due to current Covid guidelines and our immunocompromised population.  ?

## 2022-03-15 LAB — CANCER ANTIGEN 27.29: CA 27.29: 15.9 U/mL (ref 0.0–38.6)

## 2022-03-16 LAB — CANCER ANTIGEN 19-9: CA 19-9: 45 U/mL — ABNORMAL HIGH (ref 0–35)

## 2022-03-21 ENCOUNTER — Ambulatory Visit (HOSPITAL_COMMUNITY)
Admission: RE | Admit: 2022-03-21 | Discharge: 2022-03-21 | Disposition: A | Discharge status: Home / Self Care | Payer: Medicare Other | Source: Ambulatory Visit | Attending: Hematology & Oncology | Admitting: Hematology & Oncology

## 2022-03-21 DIAGNOSIS — C787 Secondary malignant neoplasm of liver and intrahepatic bile duct: Secondary | ICD-10-CM | POA: Insufficient documentation

## 2022-03-21 DIAGNOSIS — C50919 Malignant neoplasm of unspecified site of unspecified female breast: Secondary | ICD-10-CM | POA: Diagnosis not present

## 2022-03-21 LAB — GLUCOSE, CAPILLARY: Glucose-Capillary: 47 mg/dL — ABNORMAL LOW (ref 70–99)

## 2022-03-21 MED ORDER — FLUDEOXYGLUCOSE F - 18 (FDG) INJECTION
6.0000 | Freq: Once | INTRAVENOUS | Status: AC | PRN
  Administered 2022-03-21: 5.8 via INTRAVENOUS

## 2022-03-25 ENCOUNTER — Telehealth: Payer: Self-pay | Admitting: *Deleted

## 2022-03-25 NOTE — Telephone Encounter (Signed)
-----   Message from [name], MD sent at 03/23/2022  2:33 PM EDT ----- Call - PET scan shows some progression of the tumor on the left anterior chest wall.  Still no cancer in the lungs, liver, bones,  Taylor Flynn

## 2022-04-04 ENCOUNTER — Inpatient Hospital Stay: Payer: Medicare Other

## 2022-04-04 ENCOUNTER — Inpatient Hospital Stay: Payer: Medicare Other | Attending: Hematology & Oncology

## 2022-04-04 ENCOUNTER — Inpatient Hospital Stay (HOSPITAL_BASED_OUTPATIENT_CLINIC_OR_DEPARTMENT_OTHER): Payer: Medicare Other | Admitting: Hematology & Oncology

## 2022-04-04 ENCOUNTER — Encounter: Payer: Self-pay | Admitting: Hematology & Oncology

## 2022-04-04 VITALS — BP 97/65 | HR 75 | Temp 97.9°F | Resp 17 | Wt 118.0 lb

## 2022-04-04 DIAGNOSIS — Z171 Estrogen receptor negative status [ER-]: Secondary | ICD-10-CM | POA: Diagnosis not present

## 2022-04-04 DIAGNOSIS — Z5112 Encounter for antineoplastic immunotherapy: Secondary | ICD-10-CM | POA: Insufficient documentation

## 2022-04-04 DIAGNOSIS — C787 Secondary malignant neoplasm of liver and intrahepatic bile duct: Secondary | ICD-10-CM | POA: Insufficient documentation

## 2022-04-04 DIAGNOSIS — C792 Secondary malignant neoplasm of skin: Secondary | ICD-10-CM | POA: Diagnosis not present

## 2022-04-04 DIAGNOSIS — C50912 Malignant neoplasm of unspecified site of left female breast: Secondary | ICD-10-CM

## 2022-04-04 DIAGNOSIS — C50812 Malignant neoplasm of overlapping sites of left female breast: Secondary | ICD-10-CM | POA: Diagnosis present

## 2022-04-04 DIAGNOSIS — C50919 Malignant neoplasm of unspecified site of unspecified female breast: Secondary | ICD-10-CM

## 2022-04-04 LAB — CBC WITH DIFFERENTIAL (CANCER CENTER ONLY)
Abs Immature Granulocytes: 0 10*3/uL (ref 0.00–0.07)
Basophils Absolute: 0 10*3/uL (ref 0.0–0.1)
Basophils Relative: 0 %
Eosinophils Absolute: 0.1 10*3/uL (ref 0.0–0.5)
Eosinophils Relative: 3 %
HCT: 33.3 % — ABNORMAL LOW (ref 36.0–46.0)
Hemoglobin: 10.5 g/dL — ABNORMAL LOW (ref 12.0–15.0)
Immature Granulocytes: 0 %
Lymphocytes Relative: 43 %
Lymphs Abs: 2 10*3/uL (ref 0.7–4.0)
MCH: 28 pg (ref 26.0–34.0)
MCHC: 31.5 g/dL (ref 30.0–36.0)
MCV: 88.8 fL (ref 80.0–100.0)
Monocytes Absolute: 0.5 10*3/uL (ref 0.1–1.0)
Monocytes Relative: 10 %
Neutro Abs: 2.1 10*3/uL (ref 1.7–7.7)
Neutrophils Relative %: 44 %
Platelet Count: 263 10*3/uL (ref 150–400)
RBC: 3.75 MIL/uL — ABNORMAL LOW (ref 3.87–5.11)
RDW: 14.8 % (ref 11.5–15.5)
WBC Count: 4.7 10*3/uL (ref 4.0–10.5)
nRBC: 0 % (ref 0.0–0.2)

## 2022-04-04 LAB — CMP (CANCER CENTER ONLY)
ALT: 12 U/L (ref 0–44)
AST: 24 U/L (ref 15–41)
Albumin: 4.2 g/dL (ref 3.5–5.0)
Alkaline Phosphatase: 50 U/L (ref 38–126)
Anion gap: 15 (ref 5–15)
BUN: 11 mg/dL (ref 6–20)
CO2: 22 mmol/L (ref 22–32)
Calcium: 9.1 mg/dL (ref 8.9–10.3)
Chloride: 101 mmol/L (ref 98–111)
Creatinine: 0.82 mg/dL (ref 0.44–1.00)
GFR, Estimated: >60 mL/min (ref >60)
Glucose, Bld: 40 mg/dL — CL (ref 70–99)
Potassium: 3.8 mmol/L (ref 3.5–5.1)
Sodium: 138 mmol/L (ref 135–145)
Total Bilirubin: 0.4 mg/dL (ref 0.3–1.2)
Total Protein: 6.8 g/dL (ref 6.5–8.1)

## 2022-04-04 LAB — LACTATE DEHYDROGENASE: LDH: 188 U/L (ref 98–192)

## 2022-04-04 MED ORDER — TRASTUZUMAB CHEMO 150 MG IV SOLR
1.0000 mg/kg | Freq: Once | INTRAVENOUS | Status: AC
  Administered 2022-04-04: 63 mg via INTRAVENOUS
  Filled 2022-04-04: qty 3

## 2022-04-04 MED ORDER — SODIUM CHLORIDE 0.9% FLUSH
10.0000 mL | INTRAVENOUS | Status: DC | PRN
  Administered 2022-04-04: 10 mL

## 2022-04-04 MED ORDER — SODIUM CHLORIDE 0.9 % IV SOLN: Freq: Once | INTRAVENOUS | Status: AC

## 2022-04-04 MED ORDER — HEPARIN SOD (PORK) LOCK FLUSH 100 UNIT/ML IV SOLN
500.0000 [IU] | Freq: Once | INTRAVENOUS | Status: AC | PRN
  Administered 2022-04-04: 500 [IU]

## 2022-04-04 MED ORDER — DIPHENHYDRAMINE HCL 25 MG PO CAPS: 50.0000 mg | ORAL_CAPSULE | Freq: Once | ORAL | Status: DC

## 2022-04-04 MED ORDER — ACETAMINOPHEN 325 MG PO TABS: 650.0000 mg | ORAL_TABLET | Freq: Once | ORAL | Status: DC

## 2022-04-04 NOTE — Progress Notes (Signed)
Verified dose with MD, will give 1 mg/kg today per patient request.

## 2022-04-04 NOTE — Patient Instructions (Signed)

## 2022-04-04 NOTE — Progress Notes (Signed)
Hematology and Oncology Follow Up Visit  Taylor Flynn [id number] [birth year] 46 y.o. 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  We did do a PET scan on her.  This was done a week ago.  The PET scan did show evidence of progression of her skin metastasis on the left anterior chest wall.  She had a new focus of activity within the right breast.  You can actually feel a lump in the right breast at about the 8 o'clock position.  This probably measures about 6-7 mm.  She has had that she showed the other doctors the PET scan results.  They felt that this may have been pseudoprogression from where she had the photodynamic therapy.  She hopefully will be able to get her Herceptin today and get this started.  Her tumor markers have always been normal.  The only tumor marker that is somewhat elevated is the CA 19-9 which was 45 a few weeks ago.  The CA 27.29 was 16.  She had a Chromogranin A level back in March which was 136.  She is not having pain in.  She is still working.  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  She has had no cough or shortness of breath.  She has had no leg swelling.  She has had no rashes.  There is no change in bowel or bladder habits.  She has had no issues with headache.  She has had CNS metastasis.  This is being followed by Radiation Oncology.  Overall, her performance status is ECOG 1.      Medications:  Allergies as of 04/04/2022       Reactions   Aspirin Swelling   Angioedema    Sulfa Antibiotics Rash        Medication List        Accurate as of April 04, 2022  2:14 PM. If you have any  questions, ask your nurse or doctor.          MELATONIN PO Take 150 mg by mouth at bedtime.   NALTREXONE HCL PO Take 4.5 mg by mouth 3 (three) times a week.   NON FORMULARY 2 (two) times daily. Pancreatic enzymes 3 caps AC and 2 caps with meals.   OVER THE COUNTER MEDICATION Vitamin A drops-2 drops daily.   PRESCRIPTION MEDICATION Inject 1 mL into the skin every Monday, Wednesday, and Friday. mistletoe   TURMERIC CURCUMIN PO Take 1 tablet by mouth daily at 6 (six) AM.   VITAMIN C PO Take 1 tablet by mouth daily at 6 (six) AM.   Vitamin D 125 MCG (5000 UT) Caps Take by mouth daily.   VITAMIN K2 PO Take 1 tablet by mouth daily at 6 (six) AM.   ZINC PO Take 1 tablet by mouth daily at 6 (six) AM.        Allergies:  Allergies  Allergen Reactions   Aspirin Swelling    Angioedema    Sulfa Antibiotics Rash    Past Medical History, Surgical history, Social history, and Family History were reviewed  and updated.  Review of Systems: Review of Systems  Constitutional: Negative.   HENT: Negative.    Eyes: Negative.   Respiratory: Negative.    Cardiovascular: Negative.   Gastrointestinal: Negative.   Genitourinary: Negative.   Musculoskeletal: Negative.   Skin: Negative.   Neurological: Negative.   Endo/Heme/Allergies: Negative.   Psychiatric/Behavioral: Negative.      Physical Exam:  weight is 118 lb (53.5 kg). Her oral temperature is 97.9 F (36.6 C). Her blood pressure is 97/65 and her pulse is 75. Her respiration is 17 and oxygen saturation is 100%.   Wt Readings from Last 3 Encounters:  04/04/22 118 lb (53.5 kg)  03/14/22 117 lb (53.1 kg)  02/07/22 119 lb (54 kg)    Physical Exam Vitals reviewed.  Constitutional:      Comments: Her chest wall exam shows the left lumpectomy.  She has multiple nodules now.  There is a relatively large nodule in the center of the left anterior chest wall.  It is firm.  It is not mobile.  It probably measures about a  centimeter.  She has other nodules that are open.  She has a dressing over these.   HENT:     Head: Normocephalic and atraumatic.  Eyes:     Pupils: Pupils are equal, round, and reactive to light.  Cardiovascular:     Rate and Rhythm: Normal rate and regular rhythm.     Heart sounds: Normal heart sounds.  Pulmonary:     Effort: Pulmonary effort is normal.     Breath sounds: Normal breath sounds.  Abdominal:     General: Bowel sounds are normal.     Palpations: Abdomen is soft.  Musculoskeletal:        General: No tenderness or deformity. Normal range of motion.     Cervical back: Normal range of motion.  Lymphadenopathy:     Cervical: No cervical adenopathy.  Skin:    General: Skin is warm and dry.     Findings: No erythema or rash.  Neurological:     Mental Status: She is alert and oriented to person, place, and time.  Psychiatric:        Behavior: Behavior normal.        Thought Content: Thought content normal.        Judgment: Judgment normal.     Lab Results  Component Value Date   WBC 4.7 04/04/2022   HGB 10.5 (L) 04/04/2022   HCT 33.3 (L) 04/04/2022   MCV 88.8 04/04/2022   PLT 263 04/04/2022   Lab Results  Component Value Date   FERRITIN 6 (L) 12/16/2019   IRON 32 (L) 12/16/2019   TIBC 391 12/16/2019   UIBC 359 12/16/2019   IRONPCTSAT 8 (L) 12/16/2019   Lab Results  Component Value Date   RETICCTPCT 0.8 11/17/2018   RBC 3.75 (L) 04/04/2022   No results found for: KPAFRELGTCHN, LAMBDASER, KAPLAMBRATIO No results found for: IGGSERUM, IGA, IGMSERUM No results found for: Odetta Pink, SPEI   Chemistry      Component Value Date/Time   NA 138 04/04/2022 1325   K 3.8 04/04/2022 1325   CL 101 04/04/2022 1325   CO2 22 04/04/2022 1325   BUN 11 04/04/2022 1325   CREATININE 0.82 04/04/2022 1325      Component Value Date/Time   CALCIUM 9.1 04/04/2022 1325   ALKPHOS 50 04/04/2022 1325   AST 24 04/04/2022  1325   ALT 12 04/04/2022  1325   BILITOT 0.4 04/04/2022 1325       Impression and Plan:   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   She has the metastatic breast cancer.  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  She has had photodynamic therapy to that area.  Hopefully, this is pseudoprogression that we are looking at.  We will find out with her next PET scan probably in 3 months.  We will start the Herceptin today.  She wants a reduced dose of Herceptin.  I think this would be reasonable.  We will plan to get her back in another 3 weeks.  I am just happy that her quality of life is doing as well as it is doing.   [name], MD 6/1/20232:14 PM

## 2022-04-04 NOTE — Patient Instructions (Signed)
Trastuzumab injection for infusion ?What is this medication? ?TRASTUZUMAB (tras TOO zoo mab) is a monoclonal antibody. It is used to treat breast cancer and stomach cancer. ?This medicine may be used for other purposes; ask your health care provider or pharmacist if you have questions. ?COMMON BRAND NAME(S): Herceptin, Herzuma, KANJINTI, Ogivri, Ontruzant, Trazimera ?What should I tell my care team before I take this medication? ?They need to know if you have any of these conditions: ?heart disease ?heart failure ?lung or breathing disease, like asthma ?an unusual or allergic reaction to trastuzumab, benzyl alcohol, or other medications, foods, dyes, or preservatives ?pregnant or trying to get pregnant ?breast-feeding ?How should I use this medication? ?This drug is given as an infusion into a vein. It is administered in a hospital or clinic by a specially trained health care professional. ?Talk to your pediatrician regarding the use of this medicine in children. This medicine is not approved for use in children. ?Overdosage: If you think you have taken too much of this medicine contact a poison control center or emergency room at once. ?NOTE: This medicine is only for you. Do not share this medicine with others. ?What if I miss a dose? ?It is important not to miss a dose. Call your doctor or health care professional if you are unable to keep an appointment. ?What may interact with this medication? ?This medicine may interact with the following medications: ?certain types of chemotherapy, such as daunorubicin, doxorubicin, epirubicin, and idarubicin ?This list may not describe all possible interactions. Give your health care provider a list of all the medicines, herbs, non-prescription drugs, or dietary supplements you use. Also tell them if you smoke, drink alcohol, or use illegal drugs. Some items may interact with your medicine. ?What should I watch for while using this medication? ?Visit your doctor for checks  on your progress. Report any side effects. Continue your course of treatment even though you feel ill unless your doctor tells you to stop. ?Call your doctor or health care professional for advice if you get a fever, chills or sore throat, or other symptoms of a cold or flu. Do not treat yourself. Try to avoid being around people who are sick. ?You may experience fever, chills and shaking during your first infusion. These effects are usually mild and can be treated with other medicines. Report any side effects during the infusion to your health care professional. Fever and chills usually do not happen with later infusions. ?Do not become pregnant while taking this medicine or for 7 months after stopping it. Women should inform their doctor if they wish to become pregnant or think they might be pregnant. Women of child-bearing potential will need to have a negative pregnancy test before starting this medicine. There is a potential for serious side effects to an unborn child. Talk to your health care professional or pharmacist for more information. Do not breast-feed an infant while taking this medicine or for 7 months after stopping it. ?Women must use effective birth control with this medicine. ?What side effects may I notice from receiving this medication? ?Side effects that you should report to your doctor or health care professional as soon as possible: ?allergic reactions like skin rash, itching or hives, swelling of the face, lips, or tongue ?chest pain or palpitations ?cough ?dizziness ?feeling faint or lightheaded, falls ?fever ?general ill feeling or flu-like symptoms ?signs of worsening heart failure like breathing problems; swelling in your legs and feet ?unusually weak or tired ?Side effects that usually   do not require medical attention (report to your doctor or health care professional if they continue or are bothersome): ?bone pain ?changes in taste ?diarrhea ?joint pain ?nausea/vomiting ?weight  loss ?This list may not describe all possible side effects. Call your doctor for medical advice about side effects. You may report side effects to FDA at 1-800-FDA-1088. ?Where should I keep my medication? ?This drug is given in a hospital or clinic and will not be stored at home. ?NOTE: This sheet is a summary. It may not cover all possible information. If you have questions about this medicine, talk to your doctor, pharmacist, or health care provider. ?? 2023 Elsevier/Gold Standard (2016-11-05 00:00:00) ? ?

## 2022-04-04 NOTE — Progress Notes (Signed)
Dr. Marin Olp notified of glucose-40. No new orders received at this time.

## 2022-04-05 LAB — CANCER ANTIGEN 27.29: CA 27.29: 16.1 U/mL (ref 0.0–38.6)

## 2022-04-05 LAB — CANCER ANTIGEN 19-9: CA 19-9: 41 U/mL — ABNORMAL HIGH (ref 0–35)

## 2022-04-15 ENCOUNTER — Other Ambulatory Visit (HOSPITAL_COMMUNITY): Payer: Self-pay | Admitting: Neurosurgery

## 2022-04-15 ENCOUNTER — Other Ambulatory Visit: Payer: Self-pay | Admitting: Neurosurgery

## 2022-04-15 DIAGNOSIS — C7931 Secondary malignant neoplasm of brain: Secondary | ICD-10-CM

## 2022-04-15 DIAGNOSIS — R519 Headache, unspecified: Secondary | ICD-10-CM

## 2022-04-20 ENCOUNTER — Ambulatory Visit (HOSPITAL_COMMUNITY)
Admission: RE | Admit: 2022-04-20 | Discharge: 2022-04-20 | Disposition: A | Discharge status: Home / Self Care | Source: Ambulatory Visit | Attending: Hematology & Oncology | Admitting: Hematology & Oncology

## 2022-04-20 VITALS — BP 112/68 | HR 83 | Temp 97.7°F | Resp 16

## 2022-04-20 DIAGNOSIS — T148XXA Other injury of unspecified body region, initial encounter: Secondary | ICD-10-CM

## 2022-04-20 DIAGNOSIS — S80861A Insect bite (nonvenomous), right lower leg, initial encounter: Secondary | ICD-10-CM | POA: Diagnosis not present

## 2022-04-20 DIAGNOSIS — S80862A Insect bite (nonvenomous), left lower leg, initial encounter: Secondary | ICD-10-CM

## 2022-04-20 DIAGNOSIS — W57XXXA Bitten or stung by nonvenomous insect and other nonvenomous arthropods, initial encounter: Secondary | ICD-10-CM

## 2022-04-20 NOTE — ED Provider Notes (Signed)
[place]   [id number] 04/20/22 Arrival Time: 31   Chief Complaint  Patient presents with   Insect Bite    Persistent swelling at insect bite on day 6 of 7 of Keflex; cellulitis resolved - Entered by patient    SUBJECTIVE: History from: patient.  Taylor Flynn is a 46 y.o. female who presented to the urgent care with a complaint of fire ant bite to the left foot for the past 7 days.  She developed cellulitis and was treated with Keflex.  She has completed 6 days of treatment and cellulitis has resolved, however there is a persistent 1 blister at the site that remained.  Denies any alleviating factors.   Denies previous symptoms in the past.   Denies fever, chills, fatigue, chest pain, SOB, and nausea..     ROS: As per HPI.  All other pertinent ROS negative.       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 Inguinal, but patient unsure.  Age 18   MASS EXCISION Left 11/24/2018   Procedure: EXCISION NODULE LEFT CHEST WALL ERAS PATHWAY;  Surgeon: Fanny Skates, MD;  Location: [facility];  Service: General;  Laterality: Left;   SENTINEL NODE BIOPSY Left 2017 APRIL   Allergies  Allergen Reactions   Aspirin Swelling    Angioedema    Sulfa Antibiotics Rash   No current facility-administered medications on file prior to encounter.   Current Outpatient Medications on File Prior to Encounter  Medication Sig Dispense Refill   Ascorbic Acid (VITAMIN C PO) Take 1 tablet by mouth daily at 6 (six) AM.     Cholecalciferol  (VITAMIN D) 125 MCG (5000 UT) CAPS Take by mouth daily.     MELATONIN PO Take 150 mg by mouth at bedtime.     Menaquinone-7 (VITAMIN K2 PO) Take 1 tablet by mouth daily at 6 (six) AM.     Multiple Vitamins-Minerals (ZINC PO) Take 1 tablet by mouth daily at 6 (six) AM.     NALTREXONE HCL PO Take 4.5 mg by mouth 3 (three) times a week.     NON FORMULARY 2 (two) times daily. Pancreatic enzymes 3 caps AC and 2 caps with meals.     OVER THE COUNTER MEDICATION Vitamin A drops-2 drops daily.     PRESCRIPTION MEDICATION Inject 1 mL into the skin every Monday, Wednesday, and Friday. mistletoe     TURMERIC CURCUMIN PO Take 1 tablet by mouth daily at 6 (six) AM.     Social History   Socioeconomic History   Marital status: Single    Spouse name: Not on file   Number of children: Not on file   Years of education: Not on file   Highest education level: Not on file  Occupational History   Not on file  Tobacco Use   Smoking status: Never   Smokeless tobacco: Never  Vaping Use   Vaping Use: Never used  Substance and Sexual Activity   Alcohol use: Never  Drug use: Never   Sexual activity: Not Currently    Partners: Male    Birth control/protection: Coitus interruptus    Comment: >5 sexual partners in lifetime, first intercourse >15, hx breast cancer, no hx abnormal pap, no DES exposure  Other Topics Concern   Not on file  Social History Narrative   Not on file   Social Determinants of Health   Financial Resource Strain: Not on file  Food Insecurity: Not on file  Transportation Needs: Not on file  Physical Activity: Not on file  Stress: Not on file  Social Connections: Not on file  Intimate Partner Violence: Not on file   Family History  Problem Relation Age of Onset   Aortic aneurysm Mother    Hypertension Mother    Dementia Father    Asthma Sister    Asthma Brother    Breast cancer Maternal Aunt    Diabetes Maternal Grandmother    Leukemia Maternal Grandmother    Prostate  cancer Maternal Grandfather    Eczema Daughter    Prostate cancer Brother    Breast cancer Sister     OBJECTIVE:  Vitals:   04/20/22 1602  BP: 112/68  Pulse: 83  Resp: 16  Temp: 97.7 F (36.5 C)  TempSrc: Oral  SpO2: 100%     Physical Exam Vitals and nursing note reviewed.  Constitutional:      General: She is not in acute distress.    Appearance: Normal appearance. She is normal weight. She is not ill-appearing, toxic-appearing or diaphoretic.  HENT:     Head: Normocephalic.  Cardiovascular:     Rate and Rhythm: Normal rate and regular rhythm.     Pulses: Normal pulses.     Heart sounds: Normal heart sounds. No murmur heard.    No friction rub. No gallop.  Pulmonary:     Effort: Pulmonary effort is normal. No respiratory distress.     Breath sounds: Normal breath sounds. No stridor. No wheezing, rhonchi or rales.  Chest:     Chest wall: No tenderness.  Skin:    Comments: Single blister present on left foot  Neurological:     Mental Status: She is alert and oriented to person, place, and time.      LABS:  No results found for this or any previous visit (from the past 24 hour(s)).   ASSESSMENT & PLAN:  1. Insect bite of right lower leg, initial encounter   2. Blister     No orders of the defined types were placed in this encounter.   Discharge Instructions  Continue and complete Keflex as prescribed and directed Continue to monitor the blister May return and have it incised if getting worse Return or go to ED if you develop any new or worsening of the symptoms  Reviewed expectations re: course of current medical issues. Questions answered. Outlined signs and symptoms indicating need for more acute intervention. Patient verbalized understanding. After Visit Summary given.          Emerson Monte, [place] 04/20/22 1629

## 2022-04-20 NOTE — ED Triage Notes (Signed)
Pt is here for follow-up on left foot insect bite. This is day 6 of taking Keflex.

## 2022-04-20 NOTE — Discharge Instructions (Addendum)
Continue and complete Keflex as prescribed and directed Continue to monitor the blister May return and have it incised if getting worse Return or go to ED if you develop any new or worsening of the symptoms

## 2022-04-25 ENCOUNTER — Inpatient Hospital Stay: Payer: Medicare Other | Admitting: Hematology & Oncology

## 2022-04-25 ENCOUNTER — Inpatient Hospital Stay: Payer: Medicare Other

## 2022-05-08 ENCOUNTER — Inpatient Hospital Stay
Admission: RE | Admit: 2022-05-08 | Discharge: 2022-05-08 | Disposition: A | Discharge status: Home / Self Care | Payer: Self-pay | Source: Ambulatory Visit | Attending: *Deleted | Admitting: *Deleted

## 2022-05-08 ENCOUNTER — Other Ambulatory Visit: Payer: Self-pay | Admitting: *Deleted

## 2022-05-08 ENCOUNTER — Inpatient Hospital Stay: Payer: Medicare Other

## 2022-05-08 ENCOUNTER — Inpatient Hospital Stay: Payer: Medicare Other | Attending: Hematology & Oncology

## 2022-05-08 ENCOUNTER — Other Ambulatory Visit: Payer: Self-pay | Admitting: Hematology & Oncology

## 2022-05-08 ENCOUNTER — Encounter: Payer: Self-pay | Admitting: Hematology & Oncology

## 2022-05-08 ENCOUNTER — Inpatient Hospital Stay (HOSPITAL_BASED_OUTPATIENT_CLINIC_OR_DEPARTMENT_OTHER): Payer: Medicare Other | Admitting: Hematology & Oncology

## 2022-05-08 VITALS — BP 103/60 | HR 67 | Temp 97.6°F | Resp 17 | Wt 116.0 lb

## 2022-05-08 DIAGNOSIS — N63 Unspecified lump in unspecified breast: Secondary | ICD-10-CM

## 2022-05-08 DIAGNOSIS — C50919 Malignant neoplasm of unspecified site of unspecified female breast: Secondary | ICD-10-CM | POA: Diagnosis not present

## 2022-05-08 DIAGNOSIS — C787 Secondary malignant neoplasm of liver and intrahepatic bile duct: Secondary | ICD-10-CM | POA: Diagnosis not present

## 2022-05-08 DIAGNOSIS — Z1231 Encounter for screening mammogram for malignant neoplasm of breast: Secondary | ICD-10-CM

## 2022-05-08 DIAGNOSIS — R978 Other abnormal tumor markers: Secondary | ICD-10-CM | POA: Insufficient documentation

## 2022-05-08 LAB — CBC WITH DIFFERENTIAL (CANCER CENTER ONLY)
Abs Immature Granulocytes: 0.01 10*3/uL (ref 0.00–0.07)
Basophils Absolute: 0 10*3/uL (ref 0.0–0.1)
Basophils Relative: 1 %
Eosinophils Absolute: 0.3 10*3/uL (ref 0.0–0.5)
Eosinophils Relative: 6 %
HCT: 32.2 % — ABNORMAL LOW (ref 36.0–46.0)
Hemoglobin: 10.3 g/dL — ABNORMAL LOW (ref 12.0–15.0)
Immature Granulocytes: 0 %
Lymphocytes Relative: 27 %
Lymphs Abs: 1.3 10*3/uL (ref 0.7–4.0)
MCH: 28.5 pg (ref 26.0–34.0)
MCHC: 32 g/dL (ref 30.0–36.0)
MCV: 89.2 fL (ref 80.0–100.0)
Monocytes Absolute: 0.6 10*3/uL (ref 0.1–1.0)
Monocytes Relative: 13 %
Neutro Abs: 2.6 10*3/uL (ref 1.7–7.7)
Neutrophils Relative %: 53 %
Platelet Count: 234 10*3/uL (ref 150–400)
RBC: 3.61 MIL/uL — ABNORMAL LOW (ref 3.87–5.11)
RDW: 15.4 % (ref 11.5–15.5)
WBC Count: 4.8 10*3/uL (ref 4.0–10.5)
nRBC: 0 % (ref 0.0–0.2)

## 2022-05-08 LAB — CMP (CANCER CENTER ONLY)
ALT: 9 U/L (ref 0–44)
AST: 22 U/L (ref 15–41)
Albumin: 4.5 g/dL (ref 3.5–5.0)
Alkaline Phosphatase: 50 U/L (ref 38–126)
Anion gap: 8 (ref 5–15)
BUN: 21 mg/dL — ABNORMAL HIGH (ref 6–20)
CO2: 27 mmol/L (ref 22–32)
Calcium: 9.3 mg/dL (ref 8.9–10.3)
Chloride: 105 mmol/L (ref 98–111)
Creatinine: 1.02 mg/dL — ABNORMAL HIGH (ref 0.44–1.00)
GFR, Estimated: >60 mL/min (ref >60)
Glucose, Bld: 91 mg/dL (ref 70–99)
Potassium: 4.1 mmol/L (ref 3.5–5.1)
Sodium: 140 mmol/L (ref 135–145)
Total Bilirubin: 0.2 mg/dL — ABNORMAL LOW (ref 0.3–1.2)
Total Protein: 6.9 g/dL (ref 6.5–8.1)

## 2022-05-08 NOTE — Progress Notes (Signed)
Hematology and Oncology Follow Up Visit  Taylor Flynn [id number] February 02, 1976 46 y.o. 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  She is about the same.  She still has the wounds on the left anterior chest wall from where she has the diastasis.  Does not look like the phototherapy that she took did a lot for this.  There is no infection with these which is nice.  She did have a dose of Herceptin.  This was in early June.  She only got 1 mg/kg.  I am not sure why she wanted such a low dose.  I really think that she will need a higher dose if she wants to have any effect.  I also believe that she probably is going to need either Kadcyla or Enhertu to try to get a good response..  She does not want any treatment today.  She wants to go have labs done by her integrative doctors and see how they look.  She has had no cough or shortness of breath.  She has had no nausea or vomiting.  She has had no bleeding.  There is been no leg swelling.  Think she is going to have an MRI of the brain in August.  I think this is being set up by her neurosurgeon.  Her last tumor marker showed her CA 27.29 to be stable at 16.  She does have an elevated CA 19-9 of 41.  Her performance status right now is ECOG 1.       Medications:  Allergies as of 05/08/2022       Reactions   Aspirin Swelling   Angioedema    Sulfa Antibiotics Rash        Medication List        Accurate as of May 08, 2022  9:42 AM. If you have any questions, ask your nurse or doctor.          MELATONIN PO Take 150 mg by mouth at bedtime.   NALTREXONE HCL PO Take 4.5 mg by mouth 3 (three) times a week.    NON FORMULARY 2 (two) times daily. Pancreatic enzymes 3 caps AC and 2 caps with meals.   OVER THE COUNTER MEDICATION Vitamin A drops-2 drops daily.   PRESCRIPTION MEDICATION Inject 1 mL into the skin every Monday, Wednesday, and Friday. mistletoe   TURMERIC CURCUMIN PO Take 1 tablet by mouth daily at 6 (six) AM.   VITAMIN C PO Take 1 tablet by mouth daily at 6 (six) AM.   Vitamin D 125 MCG (5000 UT) Caps Take by mouth daily.   VITAMIN K2 PO Take 1 tablet by mouth daily at 6 (six) AM.   ZINC PO Take 1 tablet by mouth daily at 6 (six) AM.        Allergies:  Allergies  Allergen Reactions   Aspirin Swelling    Angioedema    Sulfa Antibiotics Rash    Past Medical History, Surgical history, Social history, and Family History were reviewed and updated.  Review of Systems: Review of Systems  Constitutional: Negative.   HENT: Negative.    Eyes:  Negative.   Respiratory: Negative.    Cardiovascular: Negative.   Gastrointestinal: Negative.   Genitourinary: Negative.   Musculoskeletal: Negative.   Skin: Negative.   Neurological: Negative.   Endo/Heme/Allergies: Negative.   Psychiatric/Behavioral: Negative.       Physical Exam:  weight is 116 lb (52.6 kg). Her oral temperature is 97.6 F (36.4 C). Her blood pressure is 103/60 and her pulse is 67. Her respiration is 17 and oxygen saturation is 98%.   Wt Readings from Last 3 Encounters:  05/08/22 116 lb (52.6 kg)  04/04/22 118 lb (53.5 kg)  03/14/22 117 lb (53.1 kg)    Physical Exam Vitals reviewed.  Constitutional:      Comments: Her chest wall exam shows the left lumpectomy.  She has multiple nodules now.  There is a relatively large nodule in the center of the left anterior chest wall.  It is firm.  It is not mobile.  It probably measures about a centimeter.  She has other nodules that are open.  She has a dressing over these.   HENT:     Head: Normocephalic and atraumatic.  Eyes:     Pupils: Pupils are  equal, round, and reactive to light.  Cardiovascular:     Rate and Rhythm: Normal rate and regular rhythm.     Heart sounds: Normal heart sounds.  Pulmonary:     Effort: Pulmonary effort is normal.     Breath sounds: Normal breath sounds.  Abdominal:     General: Bowel sounds are normal.     Palpations: Abdomen is soft.  Musculoskeletal:        General: No tenderness or deformity. Normal range of motion.     Cervical back: Normal range of motion.  Lymphadenopathy:     Cervical: No cervical adenopathy.  Skin:    General: Skin is warm and dry.     Findings: No erythema or rash.  Neurological:     Mental Status: She is alert and oriented to person, place, and time.  Psychiatric:        Behavior: Behavior normal.        Thought Content: Thought content normal.        Judgment: Judgment normal.     Lab Results  Component Value Date   WBC 4.8 05/08/2022   HGB 10.3 (L) 05/08/2022   HCT 32.2 (L) 05/08/2022   MCV 89.2 05/08/2022   PLT 234 05/08/2022   Lab Results  Component Value Date   FERRITIN 6 (L) 12/16/2019   IRON 32 (L) 12/16/2019   TIBC 391 12/16/2019   UIBC 359 12/16/2019   IRONPCTSAT 8 (L) 12/16/2019   Lab Results  Component Value Date   RETICCTPCT 0.8 11/17/2018   RBC 3.61 (L) 05/08/2022   No results found for: "KPAFRELGTCHN", "LAMBDASER", "KAPLAMBRATIO" No results found for: "IGGSERUM", "IGA", "IGMSERUM" No results found for: "TOTALPROTELP", "ALBUMINELP", "A1GS", "A2GS", "BETS", "BETA2SER", "GAMS", "MSPIKE", "SPEI"   Chemistry      Component Value Date/Time   NA 140 05/08/2022 0845   K 4.1 05/08/2022 0845   CL 105 05/08/2022 0845   CO2 27 05/08/2022 0845   BUN 21 (H) 05/08/2022 0845   CREATININE 1.02 (H) 05/08/2022 0845      Component Value Date/Time   CALCIUM 9.3 05/08/2022 0845   ALKPHOS 50 05/08/2022 0845   AST 22 05/08/2022 0845   ALT 9 05/08/2022 0845   BILITOT 0.2 (L) 05/08/2022 0845       Impression and Plan:  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   She has the metastatic breast cancer.  Surprisingly, her disease is still just confined to the left anterior chest wall.  She has had photodynamic therapy to that area.  Again, much of effect of this really was.  She will not take the Herceptin today.  We will plan to get her back in 4 weeks.  I think if she decides to go ahead with her septum, we will really have to push for a higher dose.     [name], MD 7/5/20239:42 AM

## 2022-05-08 NOTE — Patient Instructions (Signed)

## 2022-05-09 LAB — CANCER ANTIGEN 27.29: CA 27.29: 16.7 U/mL (ref 0.0–38.6)

## 2022-05-09 LAB — CHROMOGRANIN A: Chromogranin A (ng/mL): 176.9 ng/mL — ABNORMAL HIGH (ref 0.0–101.8)

## 2022-05-09 LAB — CANCER ANTIGEN 19-9: CA 19-9: 40 U/mL — ABNORMAL HIGH (ref 0–35)

## 2022-05-15 ENCOUNTER — Ambulatory Visit
Admission: RE | Admit: 2022-05-15 | Discharge: 2022-05-15 | Disposition: A | Discharge status: Home / Self Care | Payer: Medicare Other | Source: Ambulatory Visit | Attending: Hematology & Oncology | Admitting: Hematology & Oncology

## 2022-05-15 DIAGNOSIS — C787 Secondary malignant neoplasm of liver and intrahepatic bile duct: Secondary | ICD-10-CM | POA: Diagnosis present

## 2022-05-15 DIAGNOSIS — C50919 Malignant neoplasm of unspecified site of unspecified female breast: Secondary | ICD-10-CM | POA: Insufficient documentation

## 2022-05-16 ENCOUNTER — Other Ambulatory Visit: Payer: Self-pay | Admitting: Hematology & Oncology

## 2022-05-22 ENCOUNTER — Telehealth: Payer: Self-pay | Admitting: *Deleted

## 2022-05-22 ENCOUNTER — Other Ambulatory Visit: Payer: Self-pay | Admitting: Hematology & Oncology

## 2022-05-22 DIAGNOSIS — N63 Unspecified lump in unspecified breast: Secondary | ICD-10-CM

## 2022-05-22 DIAGNOSIS — R928 Other abnormal and inconclusive findings on diagnostic imaging of breast: Secondary | ICD-10-CM

## 2022-05-22 NOTE — Telephone Encounter (Signed)
Received documents from [place] [facility] [place] requesting medical records - sent documents through intra-office mail to [facility].

## 2022-05-27 ENCOUNTER — Other Ambulatory Visit: Payer: Self-pay

## 2022-06-04 ENCOUNTER — Other Ambulatory Visit: Payer: Self-pay

## 2022-06-05 ENCOUNTER — Inpatient Hospital Stay: Payer: Medicare Other

## 2022-06-05 ENCOUNTER — Inpatient Hospital Stay: Payer: Medicare Other | Admitting: Hematology & Oncology

## 2022-06-05 ENCOUNTER — Other Ambulatory Visit: Payer: Self-pay

## 2022-06-08 ENCOUNTER — Telehealth: Payer: Medicare Other | Admitting: Nurse Practitioner

## 2022-06-08 ENCOUNTER — Telehealth: Payer: Medicare Other

## 2022-06-08 ENCOUNTER — Other Ambulatory Visit: Payer: Self-pay

## 2022-06-08 DIAGNOSIS — H02843 Edema of right eye, unspecified eyelid: Secondary | ICD-10-CM

## 2022-06-08 NOTE — Progress Notes (Signed)
Virtual Visit Consent   Taylor Flynn, you are scheduled for a virtual visit with Mary-Margaret Hassell Done, [place], a [facility] provider, today.     Just as with appointments in the office, your consent must be obtained to participate.  Your consent will be active for this visit and any virtual visit you may have with one of our providers in the next 365 days.     If you have a MyChart account, a copy of this consent can be sent to you electronically.  All virtual visits are billed to your insurance company just like a traditional visit in the office.    As this is a virtual visit, video technology does not allow for your provider to perform a traditional examination.  This may limit your provider's ability to fully assess your condition.  If your provider identifies any concerns that need to be evaluated in person or the need to arrange testing (such as labs, EKG, etc.), we will make arrangements to do so.     Although advances in technology are sophisticated, we cannot ensure that it will always work on either your end or our end.  If the connection with a video visit is poor, the visit may have to be switched to a telephone visit.  With either a video or telephone visit, we are not always able to ensure that we have a secure connection.     I need to obtain your verbal consent now.   Are you willing to proceed with your visit today? YES   Taylor Flynn has provided verbal consent on 06/08/2022 for a virtual visit (video or telephone).   Mary-Margaret Hassell Done, FNP   Date: 06/08/2022 1:49 PM   Virtual Visit via Video Note   I, Mary-Margaret Hassell Done, connected with Taylor Flynn, [birth year]) on 06/08/22 at  2:00 PM EDT by a video-enabled telemedicine application and verified that I am speaking with the correct person using two identifiers.  Location: Patient: Virtual Visit Location Patient: Home Provider: Virtual Visit Location Provider: Mobile   I discussed the limitations of evaluation  and management by telemedicine and the availability of in person appointments. The patient expressed understanding and agreed to proceed.    History of Present Illness: Taylor Flynn is a 46 y.o. who identifies as a female who was assigned female at birth, and is being seen today for eye swollen.  HPI: Patient woke up this morning with right upper lid swollen. No erythema or drainage and no visual problems.     Review of Systems  Eyes:  Negative for blurred vision, double vision, photophobia, pain, discharge and redness.    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   Angio-edema 09/25/2018   Malignant neoplasm of overlapping sites of left breast in female, estrogen receptor negative ([place]) 10/21/2017    Allergies:  Allergies  Allergen Reactions   Aspirin Swelling    Angioedema    Sulfa Antibiotics Rash   Medications:  Current Outpatient Medications:    Ascorbic Acid (VITAMIN C PO), Take 1 tablet by mouth daily at 6 (six) AM., Disp: , Rfl:    Cholecalciferol (VITAMIN D) 125  MCG (5000 UT) CAPS, Take by mouth daily., Disp: , Rfl:    MELATONIN PO, Take 150 mg by mouth at bedtime., Disp: , Rfl:    Menaquinone-7 (VITAMIN K2 PO), Take 1 tablet by mouth daily at 6 (six) AM., Disp: , Rfl:    Multiple Vitamins-Minerals (ZINC PO), Take 1 tablet by mouth daily at 6 (six) AM., Disp: , Rfl:    NALTREXONE HCL PO, Take 4.5 mg by mouth 3 (three) times a week., Disp: , Rfl:    NON FORMULARY, 2 (two) times daily. Pancreatic enzymes 3 caps AC and 2 caps with meals., Disp: , Rfl:    OVER THE COUNTER MEDICATION,  Vitamin A drops-2 drops daily., Disp: , Rfl:    PRESCRIPTION MEDICATION, Inject 1 mL into the skin every Monday, Wednesday, and Friday. mistletoe, Disp: , Rfl:    TURMERIC CURCUMIN PO, Take 1 tablet by mouth daily at 6 (six) AM., Disp: , Rfl:   Observations/Objective: Patient is well-developed, well-nourished in no acute distress.  Resting comfortably  at home.  Head is normocephalic, atraumatic.  No labored breathing.  Speech is clear and coherent with logical content.  Patient is alert and oriented at baseline.    Assessment and Plan:  Taylor Flynn in today with chief complaint of swollen eye   1. Edema of right eyelid Ice BID Report any worsening or changes in symptoms. Due not rub eye     Follow Up Instructions: I discussed the assessment and treatment plan with the patient. The patient was provided an opportunity to ask questions and all were answered. The patient agreed with the plan and demonstrated an understanding of the instructions.  A copy of instructions were sent to the patient via MyChart.  The patient was advised to call back or seek an in-person evaluation if the symptoms worsen or if the condition fails to improve as anticipated.  Time:  I spent 18 minutes with the patient via telehealth technology discussing the above problems/concerns.    Mary-Margaret Hassell Done, FNP

## 2022-06-08 NOTE — Patient Instructions (Signed)
Blepharitis Blepharitis refers to inflammation of the eyelids. It is a common condition and can cause dryness or grittiness in the eyes. Other symptoms may include: Reddish, scaly skin around the scalp and eyebrows. Burning or itching of the eyelids. Eye discharge at night that causes the eyelashes to stick together in the morning. Eyelashes that fall out. Redness of the eyes. Sensitivity to light. Follow these instructions at home: Pay attention to any changes in how your eyes look or feel. Tell your health care provider about any changes. Follow these instructions to help with your condition. Keeping clean Wash your hands often with soap and water for at least 20 seconds. Clean your eyes and wash the edges of your eyelids with diluted baby shampoo or commercial eyelid wipes. Do this 2 or more times a day. Wash your face and eyebrows at least once a day. Use a clean towel each time you dry your eyelids. Do not use this towel to clean or dry other areas of your body. Do not share your towel with anyone. General instructions Avoid wearing makeup until you get better. Do not share makeup with anyone. Avoid rubbing your eyes. Use warm compresses on the eyes for 5-10 minutes. Do this 1 or 2 times a day, or as told by your health care provider. You can use warm water on a towel, but a microwaveable heating pad often stays warm longer. The pad should be very warm but not hot enough to burn the skin. If you were prescribed an antibiotic ointment or steroid drops, apply or use the medicine as told by your health care provider. Do not stop using the medicine even if you feel better. Keep all follow-up visits. This is important. Contact a health care provider if: Your eyelids feel hot. You have blisters or a rash on your eyelids. The inflammation gets worse or does not go away in 2-4 days. Get help right away if: You have pain or redness that gets worse or spreads to other parts of your face. Your  vision changes. You have pain when looking at lights or moving objects. You have a fever. Summary Blepharitis refers to inflammation of the eyelids. It can cause dryness and grittiness in the eyes. Pay attention to any changes in how your eyes look or feel. Tell your health care provider about any changes. Follow home care instructions as told by your health care provider. Wash your hands often with soap and water for at least 20 seconds. Avoid wearing makeup. Do not rub your eyes. If you were prescribed an antibiotic ointment or steroid drops, apply or use the medicine as told by your health care provider. Get help right away if you have a fever, vision changes, pain or redness that gets worse or spreads to other parts of your face, or pain when looking at lights or moving objects. This information is not intended to replace advice given to you by your health care provider. Make sure you discuss any questions you have with your health care provider. Document Revised: 11/22/2020 Document Reviewed: 11/22/2020 Elsevier Patient Education  [place].

## 2022-06-10 ENCOUNTER — Inpatient Hospital Stay: Payer: Medicare Other | Admitting: Hematology & Oncology

## 2022-06-10 ENCOUNTER — Inpatient Hospital Stay: Payer: Medicare Other

## 2022-06-18 ENCOUNTER — Telehealth: Payer: Self-pay | Admitting: *Deleted

## 2022-06-18 ENCOUNTER — Inpatient Hospital Stay: Payer: Medicare Other

## 2022-06-18 ENCOUNTER — Inpatient Hospital Stay: Payer: Medicare Other | Attending: Hematology & Oncology

## 2022-06-18 ENCOUNTER — Encounter: Payer: Self-pay | Admitting: Hematology & Oncology

## 2022-06-18 ENCOUNTER — Inpatient Hospital Stay (HOSPITAL_BASED_OUTPATIENT_CLINIC_OR_DEPARTMENT_OTHER): Payer: Medicare Other | Admitting: Hematology & Oncology

## 2022-06-18 VITALS — BP 103/53 | HR 71 | Temp 98.1°F | Resp 18 | Ht 64.0 in | Wt 121.0 lb

## 2022-06-18 DIAGNOSIS — C50919 Malignant neoplasm of unspecified site of unspecified female breast: Secondary | ICD-10-CM | POA: Diagnosis not present

## 2022-06-18 DIAGNOSIS — Z452 Encounter for adjustment and management of vascular access device: Secondary | ICD-10-CM | POA: Insufficient documentation

## 2022-06-18 DIAGNOSIS — C787 Secondary malignant neoplasm of liver and intrahepatic bile duct: Secondary | ICD-10-CM | POA: Diagnosis not present

## 2022-06-18 DIAGNOSIS — R978 Other abnormal tumor markers: Secondary | ICD-10-CM | POA: Insufficient documentation

## 2022-06-18 DIAGNOSIS — C7931 Secondary malignant neoplasm of brain: Secondary | ICD-10-CM | POA: Insufficient documentation

## 2022-06-18 DIAGNOSIS — Z171 Estrogen receptor negative status [ER-]: Secondary | ICD-10-CM | POA: Diagnosis not present

## 2022-06-18 DIAGNOSIS — C7951 Secondary malignant neoplasm of bone: Secondary | ICD-10-CM | POA: Insufficient documentation

## 2022-06-18 LAB — CBC WITH DIFFERENTIAL (CANCER CENTER ONLY)
Abs Immature Granulocytes: 0.01 10*3/uL (ref 0.00–0.07)
Basophils Absolute: 0 10*3/uL (ref 0.0–0.1)
Basophils Relative: 1 %
Eosinophils Absolute: 0.1 10*3/uL (ref 0.0–0.5)
Eosinophils Relative: 2 %
HCT: 34.9 % — ABNORMAL LOW (ref 36.0–46.0)
Hemoglobin: 11 g/dL — ABNORMAL LOW (ref 12.0–15.0)
Immature Granulocytes: 0 %
Lymphocytes Relative: 29 %
Lymphs Abs: 1.3 10*3/uL (ref 0.7–4.0)
MCH: 28.4 pg (ref 26.0–34.0)
MCHC: 31.5 g/dL (ref 30.0–36.0)
MCV: 89.9 fL (ref 80.0–100.0)
Monocytes Absolute: 0.4 10*3/uL (ref 0.1–1.0)
Monocytes Relative: 8 %
Neutro Abs: 2.7 10*3/uL (ref 1.7–7.7)
Neutrophils Relative %: 60 %
Platelet Count: 238 10*3/uL (ref 150–400)
RBC: 3.88 MIL/uL (ref 3.87–5.11)
RDW: 15.8 % — ABNORMAL HIGH (ref 11.5–15.5)
WBC Count: 4.4 10*3/uL (ref 4.0–10.5)
nRBC: 0 % (ref 0.0–0.2)

## 2022-06-18 LAB — CMP (CANCER CENTER ONLY)
ALT: 10 U/L (ref 0–44)
AST: 19 U/L (ref 15–41)
Albumin: 4.4 g/dL (ref 3.5–5.0)
Alkaline Phosphatase: 44 U/L (ref 38–126)
Anion gap: 15 (ref 5–15)
BUN: 12 mg/dL (ref 6–20)
CO2: 21 mmol/L — ABNORMAL LOW (ref 22–32)
Calcium: 9.2 mg/dL (ref 8.9–10.3)
Chloride: 103 mmol/L (ref 98–111)
Creatinine: 0.93 mg/dL (ref 0.44–1.00)
GFR, Estimated: >60 mL/min (ref >60)
Glucose, Bld: 43 mg/dL — CL (ref 70–99)
Potassium: 4.1 mmol/L (ref 3.5–5.1)
Sodium: 139 mmol/L (ref 135–145)
Total Bilirubin: 0.4 mg/dL (ref 0.3–1.2)
Total Protein: 7.3 g/dL (ref 6.5–8.1)

## 2022-06-18 LAB — LACTATE DEHYDROGENASE: LDH: 198 U/L — ABNORMAL HIGH (ref 98–192)

## 2022-06-18 LAB — SEDIMENTATION RATE: Sed Rate: 18 mm/hr (ref 0–22)

## 2022-06-18 MED ORDER — SODIUM CHLORIDE 0.9% FLUSH
10.0000 mL | INTRAVENOUS | Status: DC | PRN
  Administered 2022-06-18: 10 mL

## 2022-06-18 MED ORDER — SODIUM CHLORIDE 0.9 % IV SOLN: Freq: Once | INTRAVENOUS | Status: DC

## 2022-06-18 MED ORDER — TRASTUZUMAB CHEMO 150 MG IV SOLR: 1.0000 mg/kg | Freq: Once | INTRAVENOUS | Status: DC

## 2022-06-18 MED ORDER — DIPHENHYDRAMINE HCL 25 MG PO CAPS: 50.0000 mg | ORAL_CAPSULE | Freq: Once | ORAL | Status: DC

## 2022-06-18 MED ORDER — ACETAMINOPHEN 325 MG PO TABS: 650.0000 mg | ORAL_TABLET | Freq: Once | ORAL | Status: DC

## 2022-06-18 MED ORDER — HEPARIN SOD (PORK) LOCK FLUSH 100 UNIT/ML IV SOLN
500.0000 [IU] | Freq: Once | INTRAVENOUS | Status: AC | PRN
  Administered 2022-06-18: 500 [IU]

## 2022-06-18 NOTE — Patient Instructions (Signed)

## 2022-06-18 NOTE — Progress Notes (Signed)
Herceptin dose will be increased to 6 mg/kg q3w. Sufficient Herceptin not in stock today, patient will return on Thursday for full dose. Orders changed per Dr. Antonieta Pert instructions.

## 2022-06-18 NOTE — Progress Notes (Signed)
Pt advised, verbalized understanding. Pt will stop at scheduling for new appt.

## 2022-06-18 NOTE — Progress Notes (Signed)
Hematology and Oncology Follow Up Visit  Taylor Flynn [id number] 1976/08/22 46 y.o. 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  She feels okay.  She does have some more problems with the breast cancer.  She did see radiology about the mass in the right breast.  However, they wanted to do a biopsy.  They want to put a clip in before a biopsy.  However, she did not wish to have this done.  She is having some coalescing of the lesions on the left chest wall.  Clearly, the low-dose Herceptin is just not working all that well.  We have several options.  We give her full dose Herceptin.  She does not want to have Kadcyla because she had a one-time may cause a bad headache for her.  She does not wish to try Enhertu right now just because of the side effects that she is read.  There is some bleeding from the lesions on the left anterior chest wall.  She does not have any headache.  She had a special breast cancer began down in [place].  This clearly showed that she had the active disease on the left anterior chest wall.  I think this was the only site that had disease.  She has been followed by Radiation Oncology for the brain mets.  She has had no fever.  She has had no cough or shortness of breath.  Overall, I would say performance status is probably ECOG 1.        Medications:  Allergies as of 06/18/2022       Reactions   Aspirin Swelling   Angioedema    Sulfa Antibiotics Rash        Medication List        Accurate as of June 18, 2022  9:54 AM. If you have any questions, ask your nurse or doctor.          MELATONIN PO Take 150 mg by mouth at  bedtime.   NALTREXONE HCL PO Take 4.5 mg by mouth 3 (three) times a week.   NON FORMULARY 2 (two) times daily. Pancreatic enzymes 3 caps AC and 2 caps with meals.   OVER THE COUNTER MEDICATION Vitamin A drops-2 drops daily.   PRESCRIPTION MEDICATION Inject 1 mL into the skin every Monday, Wednesday, and Friday. mistletoe   TURMERIC CURCUMIN PO Take 1 tablet by mouth daily at 6 (six) AM.   VITAMIN C PO Take 1 tablet by mouth daily at 6 (six) AM.   Vitamin D 125 MCG (5000 UT) Caps Take by mouth daily.   VITAMIN K2 PO Take 1 tablet by mouth daily at 6 (six) AM.   ZINC PO Take 1 tablet by mouth daily at 6 (six) AM.        Allergies:  Allergies  Allergen Reactions   Aspirin Swelling    Angioedema    Sulfa Antibiotics Rash    Past Medical History, Surgical history, Social history, and Family History were reviewed and updated.  Review of Systems: Review of Systems  Constitutional: Negative.   HENT: Negative.    Eyes: Negative.  Respiratory: Negative.    Cardiovascular: Negative.   Gastrointestinal: Negative.   Genitourinary: Negative.   Musculoskeletal: Negative.   Skin: Negative.   Neurological: Negative.   Endo/Heme/Allergies: Negative.   Psychiatric/Behavioral: Negative.       Physical Exam:  height is _0  (1.626 m) and weight is 121 lb (54.9 kg). Her oral temperature is 98.1 F (36.7 C). Her blood pressure is 103/53 (abnormal) and her pulse is 71. Her respiration is 18 and oxygen saturation is 100%.   Wt Readings from Last 3 Encounters:  06/18/22 121 lb (54.9 kg)  05/08/22 116 lb (52.6 kg)  04/04/22 118 lb (53.5 kg)    Physical Exam Vitals reviewed.  Constitutional:      Comments: Her chest wall exam shows the left lumpectomy.  She has multiple nodules now.  There is a relatively large nodule in the center of the left anterior chest wall.  It is firm.  It is not mobile.  It probably measures about a centimeter.  She has other nodules that are  open.  She has a dressing over these.   HENT:     Head: Normocephalic and atraumatic.  Eyes:     Pupils: Pupils are equal, round, and reactive to light.  Cardiovascular:     Rate and Rhythm: Normal rate and regular rhythm.     Heart sounds: Normal heart sounds.  Pulmonary:     Effort: Pulmonary effort is normal.     Breath sounds: Normal breath sounds.  Abdominal:     General: Bowel sounds are normal.     Palpations: Abdomen is soft.  Musculoskeletal:        General: No tenderness or deformity. Normal range of motion.     Cervical back: Normal range of motion.  Lymphadenopathy:     Cervical: No cervical adenopathy.  Skin:    General: Skin is warm and dry.     Findings: No erythema or rash.  Neurological:     Mental Status: She is alert and oriented to person, place, and time.  Psychiatric:        Behavior: Behavior normal.        Thought Content: Thought content normal.        Judgment: Judgment normal.      Lab Results  Component Value Date   WBC 4.4 06/18/2022   HGB 11.0 (L) 06/18/2022   HCT 34.9 (L) 06/18/2022   MCV 89.9 06/18/2022   PLT 238 06/18/2022   Lab Results  Component Value Date   FERRITIN 6 (L) 12/16/2019   IRON 32 (L) 12/16/2019   TIBC 391 12/16/2019   UIBC 359 12/16/2019   IRONPCTSAT 8 (L) 12/16/2019   Lab Results  Component Value Date   RETICCTPCT 0.8 11/17/2018   RBC 3.88 06/18/2022   No results found for: "KPAFRELGTCHN", "LAMBDASER", "KAPLAMBRATIO" No results found for: "IGGSERUM", "IGA", "IGMSERUM" No results found for: "TOTALPROTELP", "ALBUMINELP", "A1GS", "A2GS", "BETS", "BETA2SER", "GAMS", "MSPIKE", "SPEI"   Chemistry      Component Value Date/Time   NA 140 05/08/2022 0845   K 4.1 05/08/2022 0845   CL 105 05/08/2022 0845   CO2 27 05/08/2022 0845   BUN 21 (H) 05/08/2022 0845   CREATININE 1.02 (H) 05/08/2022 0845      Component Value Date/Time   CALCIUM 9.3 05/08/2022 0845   ALKPHOS 50 05/08/2022 0845   AST 22 05/08/2022 0845    ALT 9 05/08/2022 0845   BILITOT 0.2 (L) 05/08/2022 0845  Impression and Plan:   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   She has the metastatic breast cancer.  Surprisingly, her disease is still just confined to the left anterior chest wall.  She has had photodynamic therapy to that area.    It does look like the disease is progressing on the left anterior chest wall.  We will go ahead and try her on full dose Herceptin.  We will have to have her come back for this.  We really need to make sure she gets treatment every 3 weeks.  I think we are at a point that we have to make sure that we stay on treatment and stay on schedule with as much dose as possible.  If not, then we will get had to make a change.  I will plan to get her back in another 3 weeks or so.     [name], MD 8/15/20239:54 AM

## 2022-06-18 NOTE — Telephone Encounter (Signed)
Critical Glucose of 43 reported by [facility] in lab.  Dr Marin Olp notified.  No orders received.

## 2022-06-19 ENCOUNTER — Other Ambulatory Visit: Payer: Self-pay

## 2022-06-19 LAB — CANCER ANTIGEN 19-9: CA 19-9: 45 U/mL — ABNORMAL HIGH (ref 0–35)

## 2022-06-19 LAB — CANCER ANTIGEN 27.29: CA 27.29: 16.2 U/mL (ref 0.0–38.6)

## 2022-06-20 LAB — CHROMOGRANIN A: Chromogranin A (ng/mL): 79.8 ng/mL (ref 0.0–101.8)

## 2022-06-25 ENCOUNTER — Inpatient Hospital Stay: Payer: Medicare Other

## 2022-07-05 ENCOUNTER — Other Ambulatory Visit: Payer: Self-pay

## 2022-07-15 ENCOUNTER — Other Ambulatory Visit: Payer: Self-pay

## 2022-07-16 ENCOUNTER — Inpatient Hospital Stay

## 2022-07-16 ENCOUNTER — Inpatient Hospital Stay: Attending: Hematology & Oncology

## 2022-07-16 ENCOUNTER — Inpatient Hospital Stay: Admitting: Family

## 2022-07-19 IMAGING — MR MR HEAD WO/W CM
16 series · 48 of 48 positions shown · IV contrast (gadavist)
Comparison: None.

CLINICAL DATA: New onset headaches. History of metastatic breast
cancer.

EXAM:
MRI HEAD WITHOUT AND WITH CONTRAST
TECHNIQUE: Multiplanar, multiecho pulse sequences of the brain and surrounding
structures were obtained without and with intravenous contrast.
CONTRAST:  5mL GADAVIST GADOBUTROL 1 MMOL/ML IV SOLN

[Series 9: DWI · axial · 3.0mm · 1.31mm/px · z∈[-30,+126]mm · 5 of 107 slices shown (1 of 4)]
[im 1/107]
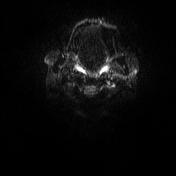
[im 27/107]
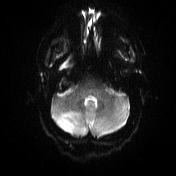
[im 54/107]
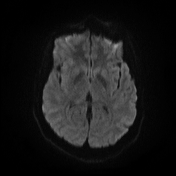
[im 80/107]
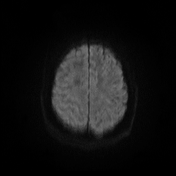
[im 107/107]
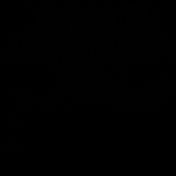

[Series 10: DWI · axial · 3.0mm · 1.31mm/px · z∈[-30,+120]mm · 3 of 52 slices shown (2 of 4)]
[im 1/52]
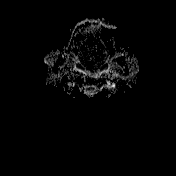
[im 26/52]
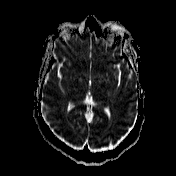
[im 52/52]
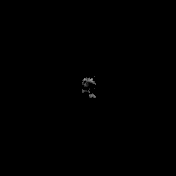

[Series 11: mip_images(sw) · axial · 24.0mm · 0.66mm/px · z∈[-16,+114]mm · 2 of 45 slices shown]
[im 1/45]
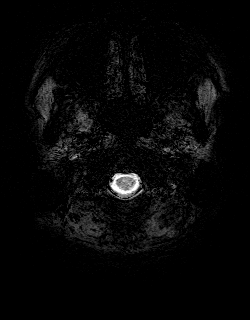
[im 45/45]
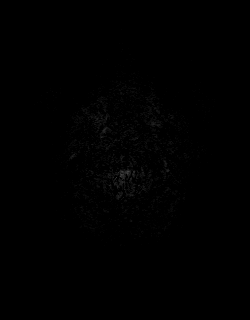

[Series 12: swi_images · axial · 3.0mm · 0.66mm/px · z∈[-26,+125]mm · 2 of 52 slices shown]
[im 1/52]
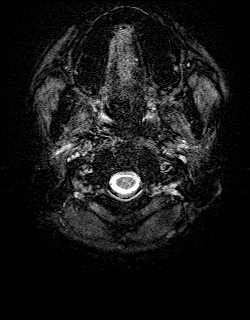
[im 52/52]
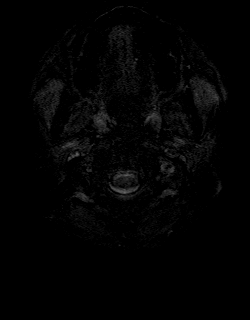

[Series 13: FLAIR · axial · 3.0mm · 0.66mm/px · z∈[-27,+126]mm · 2 of 53 slices shown]
[im 1/53]
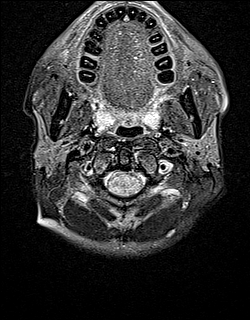
[im 53/53]
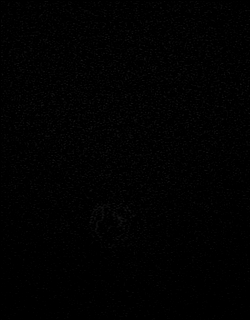

[Series 14: DWI · coronal · 5.0mm · 1.31mm/px · 3 of 76 slices shown (3 of 4)]
[im 1/76]
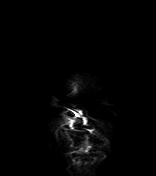
[im 38/76]
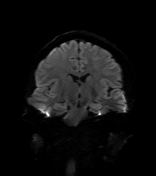
[im 76/76]
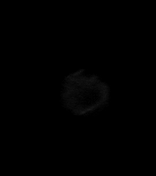

[Series 15: DWI · coronal · 5.0mm · 1.31mm/px · 2 of 38 slices shown (4 of 4)]
[im 1/38]
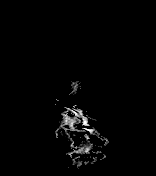
[im 38/38]
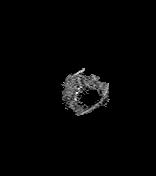

[Series 16: T1 · axial · 1.0mm · 0.82mm/px · z∈[-31,+125]mm · 6 of 160 slices shown (1 of 2)]
[im 1/160]
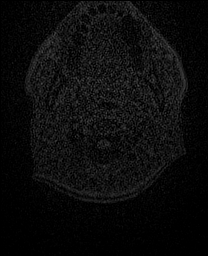
[im 32/160]
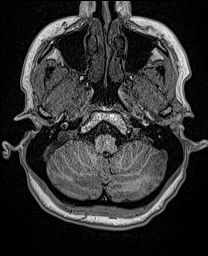
[im 64/160]
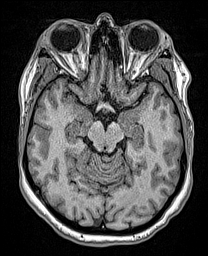
[im 96/160]
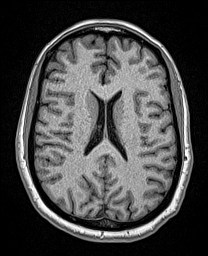
[im 128/160]
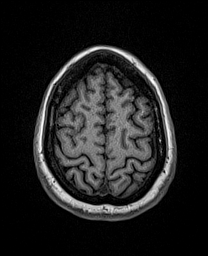
[im 160/160]
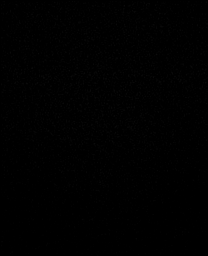

[Series 17: T1 · sagittal · 5.0mm · 0.75mm/px · 1 of 25 slices shown (2 of 2)]
[im 1/25]
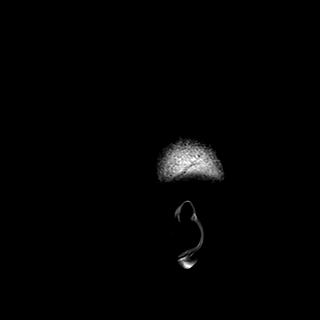

[Series 18: T2 · axial · 5.0mm · 0.55mm/px · 1 of 25 slices shown (1 of 2)]
[im 1/25]
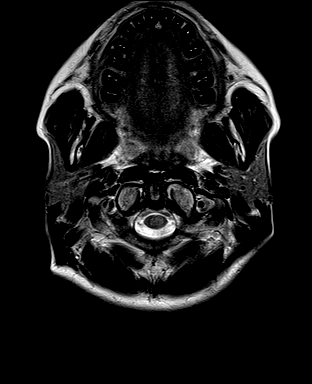

[Series 19: T2 · coronal · 5.0mm · 0.57mm/px · 1 of 29 slices shown (2 of 2)]
[im 1/29]
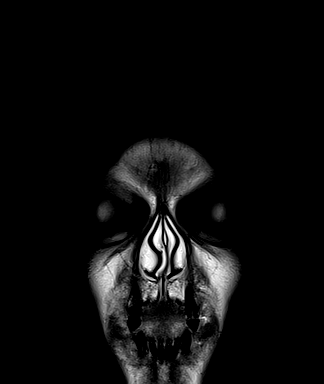

[Series 20: T1 post-contrast · axial · 1.0mm · 0.82mm/px · z∈[-31,+125]mm · 6 of 160 slices shown (1 of 3)]
[im 1/160]
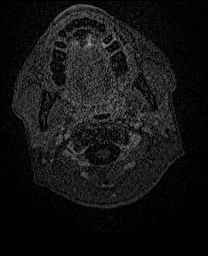
[im 32/160]
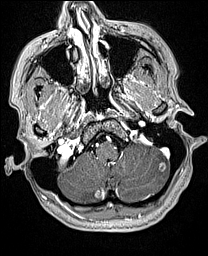
[im 64/160]
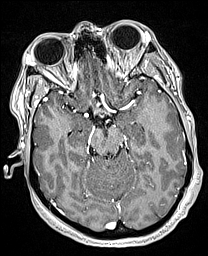
[im 96/160]
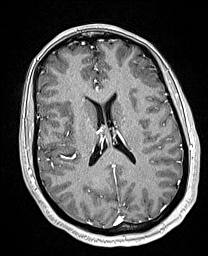
[im 128/160]
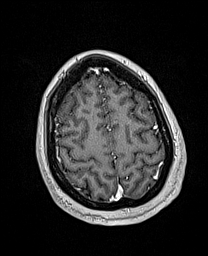
[im 160/160]
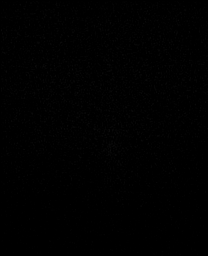

[Series 25: T1 post-contrast · coronal · 5.0mm · 0.43mm/px · 1 of 29 slices shown (2 of 3)]
[im 1/29]
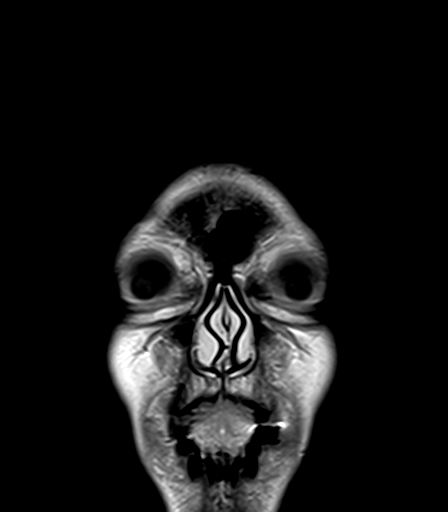

[Series 26: T1 post-contrast · sagittal · 5.0mm · 0.75mm/px · 1 of 25 slices shown (3 of 3)]
[im 1/25]
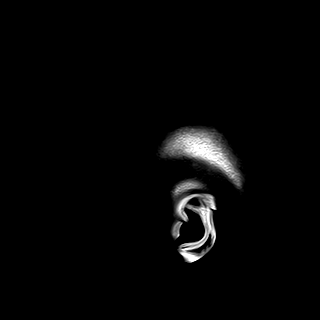

[Series 100: <mpr range> · axial · 1.0mm · 0.43mm/px · z∈[+46,+152]mm · 6 of 155 slices shown]
[im 1/155]
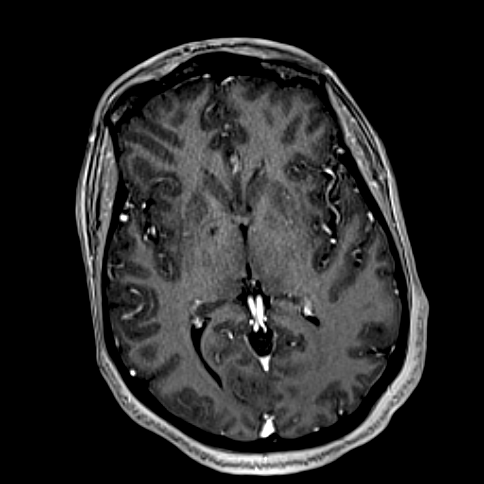
[im 31/155]
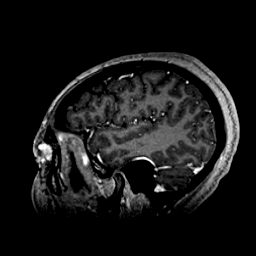
[im 62/155]
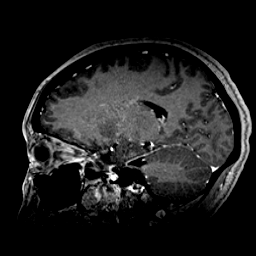
[im 93/155]
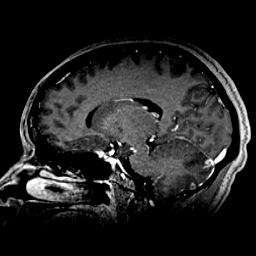
[im 124/155]
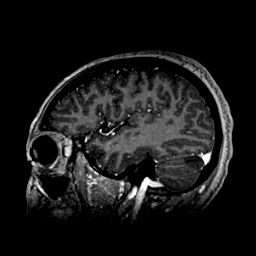
[im 155/155]
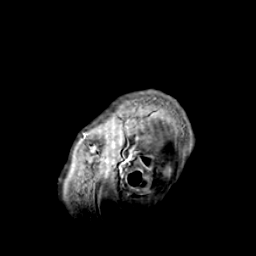

[Series 101: <mpr range(1)> · axial · 1.0mm · 0.43mm/px · z∈[+46,+187]mm · 6 of 155 slices shown]
[im 1/155]
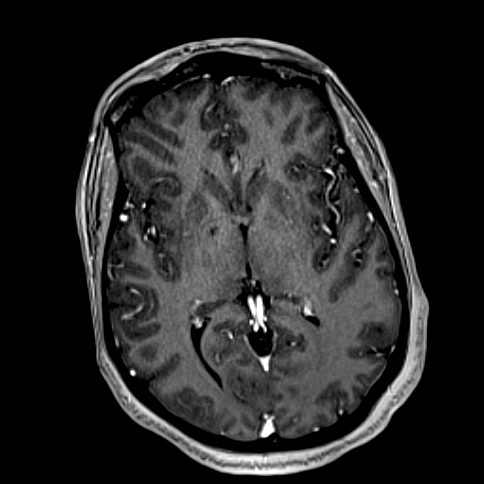
[im 31/155]
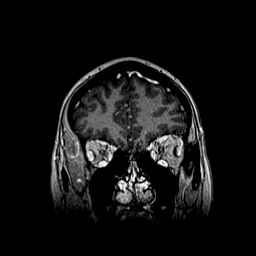
[im 62/155]
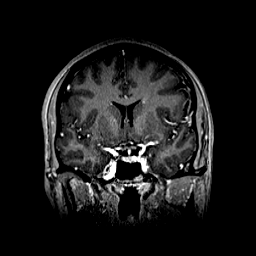
[im 93/155]
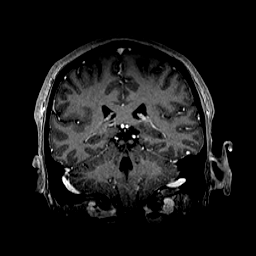
[im 124/155]
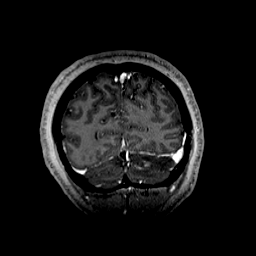
[im 155/155]
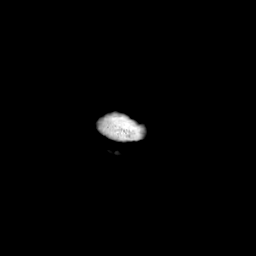

[48 of 48 positions shown; findings below may reference images not displayed]

FINDINGS: Brain: There are a total of 3 enhancing cerebellar lesions which
measure 1.3 cm and 1.1 cm on the right and 0.7 cm on the left. There
is moderate vasogenic edema associated with the largest lesion and
mild edema associated with the other 2 lesions without significant
posterior fossa mass effect. No enhancing supratentorial lesions are
identified.

The ventricles are normal in size. No acute infarct, intracranial
hemorrhage, midline shift, or extra-axial fluid collection is
identified. No significant cerebral white matter disease is evident.

Vascular: Major intracranial vascular flow voids are preserved.

Skull and upper cervical spine: Unremarkable bone marrow signal.

Sinuses/Orbits: Unremarkable orbits. Paranasal sinuses and mastoid
air cells are clear.

Other: None.
IMPRESSION: 1. Three enhancing cerebellar lesions consistent with metastases.
Associated mild-to-moderate edema without posterior fossa mass
effect.
2. Unremarkable appearance of the supratentorial brain.

These results will be called to the ordering clinician or
representative by the Radiologist Assistant, and communication
documented in the PACS or [REDACTED].

## 2022-07-23 ENCOUNTER — Other Ambulatory Visit: Payer: Self-pay

## 2022-08-30 ENCOUNTER — Encounter: Payer: Self-pay | Admitting: Hematology & Oncology

## 2022-08-30 ENCOUNTER — Encounter: Payer: Self-pay | Admitting: Family

## 2022-09-26 ENCOUNTER — Other Ambulatory Visit: Payer: Self-pay

## 2023-08-08 ENCOUNTER — Other Ambulatory Visit: Payer: Self-pay

## 2023-12-03 ENCOUNTER — Other Ambulatory Visit: Payer: Self-pay

## 2024-01-21 ENCOUNTER — Other Ambulatory Visit: Payer: Self-pay

## 2024-01-26 ENCOUNTER — Emergency Department (HOSPITAL_COMMUNITY)

## 2024-01-26 ENCOUNTER — Inpatient Hospital Stay (HOSPITAL_COMMUNITY)
Admission: EM | Admit: 2024-01-26 | Discharge: 2024-01-30 | DRG: 180 | Disposition: A | Discharge status: Hospice | Attending: Internal Medicine | Admitting: Internal Medicine

## 2024-01-26 ENCOUNTER — Encounter (HOSPITAL_COMMUNITY): Payer: Self-pay | Admitting: Emergency Medicine

## 2024-01-26 ENCOUNTER — Encounter: Payer: Self-pay | Admitting: Hematology & Oncology

## 2024-01-26 ENCOUNTER — Encounter: Payer: Self-pay | Admitting: Family

## 2024-01-26 ENCOUNTER — Other Ambulatory Visit: Payer: Self-pay

## 2024-01-26 DIAGNOSIS — F419 Anxiety disorder, unspecified: Secondary | ICD-10-CM | POA: Diagnosis present

## 2024-01-26 DIAGNOSIS — L8990 Pressure ulcer of unspecified site, unspecified stage: Secondary | ICD-10-CM

## 2024-01-26 DIAGNOSIS — R4589 Other symptoms and signs involving emotional state: Secondary | ICD-10-CM

## 2024-01-26 DIAGNOSIS — Z79620 Long term (current) use of immunosuppressive biologic: Secondary | ICD-10-CM

## 2024-01-26 DIAGNOSIS — R7989 Other specified abnormal findings of blood chemistry: Secondary | ICD-10-CM

## 2024-01-26 DIAGNOSIS — Z886 Allergy status to analgesic agent status: Secondary | ICD-10-CM

## 2024-01-26 DIAGNOSIS — Z882 Allergy status to sulfonamides status: Secondary | ICD-10-CM

## 2024-01-26 DIAGNOSIS — K7460 Unspecified cirrhosis of liver: Secondary | ICD-10-CM | POA: Diagnosis present

## 2024-01-26 DIAGNOSIS — Z66 Do not resuscitate: Secondary | ICD-10-CM | POA: Diagnosis not present

## 2024-01-26 DIAGNOSIS — Z825 Family history of asthma and other chronic lower respiratory diseases: Secondary | ICD-10-CM

## 2024-01-26 DIAGNOSIS — J90 Pleural effusion, not elsewhere classified: Secondary | ICD-10-CM

## 2024-01-26 DIAGNOSIS — C799 Secondary malignant neoplasm of unspecified site: Secondary | ICD-10-CM

## 2024-01-26 DIAGNOSIS — R54 Age-related physical debility: Secondary | ICD-10-CM | POA: Diagnosis present

## 2024-01-26 DIAGNOSIS — Z938 Other artificial opening status: Secondary | ICD-10-CM

## 2024-01-26 DIAGNOSIS — Z818 Family history of other mental and behavioral disorders: Secondary | ICD-10-CM

## 2024-01-26 DIAGNOSIS — Z5986 Financial insecurity: Secondary | ICD-10-CM

## 2024-01-26 DIAGNOSIS — L89312 Pressure ulcer of right buttock, stage 2: Secondary | ICD-10-CM | POA: Diagnosis present

## 2024-01-26 DIAGNOSIS — R600 Localized edema: Secondary | ICD-10-CM

## 2024-01-26 DIAGNOSIS — Z853 Personal history of malignant neoplasm of breast: Secondary | ICD-10-CM

## 2024-01-26 DIAGNOSIS — C50919 Malignant neoplasm of unspecified site of unspecified female breast: Secondary | ICD-10-CM | POA: Diagnosis present

## 2024-01-26 DIAGNOSIS — Z806 Family history of leukemia: Secondary | ICD-10-CM

## 2024-01-26 DIAGNOSIS — N1330 Unspecified hydronephrosis: Secondary | ICD-10-CM | POA: Diagnosis present

## 2024-01-26 DIAGNOSIS — Z8249 Family history of ischemic heart disease and other diseases of the circulatory system: Secondary | ICD-10-CM

## 2024-01-26 DIAGNOSIS — R188 Other ascites: Secondary | ICD-10-CM

## 2024-01-26 DIAGNOSIS — J9601 Acute respiratory failure with hypoxia: Secondary | ICD-10-CM | POA: Diagnosis present

## 2024-01-26 DIAGNOSIS — Z833 Family history of diabetes mellitus: Secondary | ICD-10-CM

## 2024-01-26 DIAGNOSIS — Z9221 Personal history of antineoplastic chemotherapy: Secondary | ICD-10-CM

## 2024-01-26 DIAGNOSIS — C787 Secondary malignant neoplasm of liver and intrahepatic bile duct: Secondary | ICD-10-CM | POA: Diagnosis present

## 2024-01-26 DIAGNOSIS — D649 Anemia, unspecified: Secondary | ICD-10-CM | POA: Insufficient documentation

## 2024-01-26 DIAGNOSIS — R0602 Shortness of breath: Secondary | ICD-10-CM | POA: Diagnosis not present

## 2024-01-26 DIAGNOSIS — J9621 Acute and chronic respiratory failure with hypoxia: Secondary | ICD-10-CM | POA: Diagnosis present

## 2024-01-26 DIAGNOSIS — Z923 Personal history of irradiation: Secondary | ICD-10-CM

## 2024-01-26 DIAGNOSIS — J910 Malignant pleural effusion: Secondary | ICD-10-CM | POA: Diagnosis present

## 2024-01-26 DIAGNOSIS — Z79899 Other long term (current) drug therapy: Secondary | ICD-10-CM

## 2024-01-26 DIAGNOSIS — R64 Cachexia: Secondary | ICD-10-CM | POA: Diagnosis present

## 2024-01-26 DIAGNOSIS — Z5982 Transportation insecurity: Secondary | ICD-10-CM

## 2024-01-26 DIAGNOSIS — Z515 Encounter for palliative care: Secondary | ICD-10-CM

## 2024-01-26 DIAGNOSIS — C7800 Secondary malignant neoplasm of unspecified lung: Principal | ICD-10-CM | POA: Diagnosis present

## 2024-01-26 DIAGNOSIS — Z7189 Other specified counseling: Secondary | ICD-10-CM

## 2024-01-26 DIAGNOSIS — Z803 Family history of malignant neoplasm of breast: Secondary | ICD-10-CM

## 2024-01-26 DIAGNOSIS — C7931 Secondary malignant neoplasm of brain: Secondary | ICD-10-CM | POA: Diagnosis present

## 2024-01-26 DIAGNOSIS — D75839 Thrombocytosis, unspecified: Secondary | ICD-10-CM | POA: Diagnosis present

## 2024-01-26 DIAGNOSIS — D509 Iron deficiency anemia, unspecified: Secondary | ICD-10-CM

## 2024-01-26 DIAGNOSIS — Z8042 Family history of malignant neoplasm of prostate: Secondary | ICD-10-CM

## 2024-01-26 DIAGNOSIS — R52 Pain, unspecified: Secondary | ICD-10-CM

## 2024-01-26 DIAGNOSIS — Z7960 Long term (current) use of unspecified immunomodulators and immunosuppressants: Secondary | ICD-10-CM

## 2024-01-26 LAB — BLOOD GAS, VENOUS
Acid-Base Excess: 0.5 mmol/L (ref 0.0–2.0)
Bicarbonate: 26 mmol/L (ref 20.0–28.0)
Drawn by: 2711
O2 Saturation: 66 %
Patient temperature: 37
pCO2, Ven: 44 mmHg (ref 44–60)
pH, Ven: 7.38 (ref 7.25–7.43)
pO2, Ven: 40 mmHg (ref 32–45)

## 2024-01-26 LAB — CBC WITH DIFFERENTIAL/PLATELET
Abs Immature Granulocytes: 0.02 10*3/uL (ref 0.00–0.07)
Basophils Absolute: 0.1 10*3/uL (ref 0.0–0.1)
Basophils Relative: 1 %
Eosinophils Absolute: 0.3 10*3/uL (ref 0.0–0.5)
Eosinophils Relative: 5 %
HCT: 26.7 % — ABNORMAL LOW (ref 36.0–46.0)
Hemoglobin: 8 g/dL — ABNORMAL LOW (ref 12.0–15.0)
Immature Granulocytes: 0 %
Lymphocytes Relative: 15 %
Lymphs Abs: 0.9 10*3/uL (ref 0.7–4.0)
MCH: 24.1 pg — ABNORMAL LOW (ref 26.0–34.0)
MCHC: 30 g/dL (ref 30.0–36.0)
MCV: 80.4 fL (ref 80.0–100.0)
Monocytes Absolute: 0.5 10*3/uL (ref 0.1–1.0)
Monocytes Relative: 9 %
Neutro Abs: 4.3 10*3/uL (ref 1.7–7.7)
Neutrophils Relative %: 70 %
Platelets: 838 10*3/uL — ABNORMAL HIGH (ref 150–400)
RBC: 3.32 MIL/uL — ABNORMAL LOW (ref 3.87–5.11)
RDW: 17.5 % — ABNORMAL HIGH (ref 11.5–15.5)
WBC: 6.1 10*3/uL (ref 4.0–10.5)
nRBC: 0 % (ref 0.0–0.2)

## 2024-01-26 LAB — I-STAT CHEM 8, ED
BUN: 17 mg/dL (ref 6–20)
Calcium, Ion: 1.12 mmol/L — ABNORMAL LOW (ref 1.15–1.40)
Chloride: 100 mmol/L (ref 98–111)
Creatinine, Ser: 0.9 mg/dL (ref 0.44–1.00)
Glucose, Bld: 57 mg/dL — ABNORMAL LOW (ref 70–99)
HCT: 26 % — ABNORMAL LOW (ref 36.0–46.0)
Hemoglobin: 8.8 g/dL — ABNORMAL LOW (ref 12.0–15.0)
Potassium: 3.6 mmol/L (ref 3.5–5.1)
Sodium: 133 mmol/L — ABNORMAL LOW (ref 135–145)
TCO2: 22 mmol/L (ref 22–32)

## 2024-01-26 LAB — COMPREHENSIVE METABOLIC PANEL
ALT: 72 U/L — ABNORMAL HIGH (ref 0–44)
AST: 152 U/L — ABNORMAL HIGH (ref 15–41)
Albumin: 2 g/dL — ABNORMAL LOW (ref 3.5–5.0)
Alkaline Phosphatase: 575 U/L — ABNORMAL HIGH (ref 38–126)
Anion gap: 11 (ref 5–15)
BUN: 20 mg/dL (ref 6–20)
CO2: 22 mmol/L (ref 22–32)
Calcium: 8.1 mg/dL — ABNORMAL LOW (ref 8.9–10.3)
Chloride: 100 mmol/L (ref 98–111)
Creatinine, Ser: 0.84 mg/dL (ref 0.44–1.00)
GFR, Estimated: >60 mL/min (ref >60)
Glucose, Bld: 60 mg/dL — ABNORMAL LOW (ref 70–99)
Potassium: 3.6 mmol/L (ref 3.5–5.1)
Sodium: 133 mmol/L — ABNORMAL LOW (ref 135–145)
Total Bilirubin: 0.9 mg/dL (ref 0.0–1.2)
Total Protein: 4.9 g/dL — ABNORMAL LOW (ref 6.5–8.1)

## 2024-01-26 LAB — BRAIN NATRIURETIC PEPTIDE: B Natriuretic Peptide: 47 pg/mL (ref 0.0–100.0)

## 2024-01-26 LAB — RESP PANEL BY RT-PCR (RSV, FLU A&B, COVID)  RVPGX2
Influenza A by PCR: NEGATIVE
Influenza B by PCR: NEGATIVE
Resp Syncytial Virus by PCR: NEGATIVE
SARS Coronavirus 2 by RT PCR: NEGATIVE

## 2024-01-26 LAB — I-STAT CG4 LACTIC ACID, ED: Lactic Acid, Venous: 0.9 mmol/L (ref 0.5–1.9)

## 2024-01-26 LAB — APTT: aPTT: 30 s (ref 24–36)

## 2024-01-26 LAB — PROTIME-INR
INR: 1 (ref 0.8–1.2)
Prothrombin Time: 12.9 s (ref 11.4–15.2)

## 2024-01-26 NOTE — ED Provider Notes (Signed)
 [place] [facility] AT [facility] Provider Note   CSN: [id number] Arrival date & time: 01/26/24  2032     History {Add pertinent medical, surgical, social history, OB history to HPI:1} Chief Complaint  Patient presents with  . Shortness of Breath    Taylor Flynn is a 48 y.o. female with metastatic breast cancer, [facility], who presents from home due to SOB for the past day. EMS noticed abdominal distention and jaundice. She recently had a thoracentesis in which about 1 L was removed. Breast cancer metastatic to lung/liver/brain. ***     EMS vitals: 90% SPO2 on room air,  99% SPO2 on 2 L O2 nasal canula 110 P 142/88 BP    Past Medical History:  Diagnosis Date  . Breast cancer metastasized to brain, unspecified laterality (HCC) 11/13/2020  . Breast cancer metastasized to liver, unspecified laterality (HCC) 10/08/2018  . 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 Goals of care, counseling/discussion 11/13/2020  . Narcolepsy   . Scoliosis    MILD  . Urge incontinence   . Urticaria        Home Medications Prior to Admission medications   Medication Sig Start Date End Date Taking? Authorizing Provider  Ascorbic Acid (VITAMIN C PO) Take 1 tablet by mouth daily at 6 (six) AM.    [provider]  Cholecalciferol (VITAMIN D) 125 MCG (5000 UT) CAPS Take by mouth daily.    [provider]  MELATONIN PO Take 150 mg by mouth at bedtime.    [provider]  Menaquinone-7 (VITAMIN K2 PO) Take 1 tablet by mouth daily at 6 (six) AM.    [provider]  Multiple Vitamins-Minerals (ZINC PO) Take 1 tablet by mouth daily at 6 (six) AM.    [provider]  NALTREXONE HCL PO Take 4.5 mg by mouth 3 (three) times a week.    [provider]  NON FORMULARY 2 (two) times daily. Pancreatic enzymes 3 caps AC and 2 caps with meals.    [provider]  OVER THE COUNTER MEDICATION Vitamin A  drops-2 drops daily.    [provider]  PRESCRIPTION MEDICATION Inject 1 mL into the skin every Monday, Wednesday, and Friday. mistletoe 12/16/19   [provider]  TURMERIC CURCUMIN PO Take 1 tablet by mouth daily at 6 (six) AM.    [provider]      Allergies    Aspirin and Sulfa antibiotics    Review of Systems   Review of Systems A 10 point review of systems was performed and is negative unless otherwise reported in HPI.  Physical Exam Updated Vital Signs BP (!) 129/91 (BP Location: Left Arm)   Pulse (!) 129   Resp 20   LMP 12/07/2018   SpO2 100%  Physical Exam General: Normal appearing {Desc; female/female:11659}, lying in bed.  HEENT: PERRLA, Sclera anicteric, MMM, trachea midline.  Cardiology: RRR, no murmurs/rubs/gallops. BL radial and DP pulses equal bilaterally.  Resp: Normal respiratory rate and effort. CTAB, no wheezes, rhonchi, crackles.  Abd: Soft, non-tender, non-distended. No rebound tenderness or guarding.  GU: Deferred. MSK: No peripheral edema or signs of trauma. Extremities without deformity or TTP. No cyanosis or clubbing. Skin: warm, dry. No rashes or lesions. Back: No CVA tenderness Neuro: A&Ox4, CNs II-XII grossly intact. MAEs. Sensation grossly intact.  Psych: Normal mood and affect.   ED Results / Procedures / Treatments   Labs (all labs ordered are  listed, but only abnormal results are displayed) Labs Reviewed - No data to display  EKG EKG Interpretation Date/Time:  Monday January 26 2024 20:47:12 EDT Ventricular Rate:  128 PR Interval:  126 QRS Duration:  104 QT Interval:  288 QTC Calculation: 421 R Axis:   50  Text Interpretation: Sinus tachycardia Ventricular premature complex Low voltage, extremity and precordial leads Minimal ST depression, inferior leads Confirmed by Vivi Barrack [phone]) on 01/26/2024 9:19:42 PM  Radiology DG Chest 2 View Result Date: 01/26/2024 CLINICAL DATA:  Shortness of breath, abdominal  distention, and jaundice. Recent thoracentesis. EXAM: CHEST - 2 VIEW COMPARISON:  03/21/2022. FINDINGS: The heart size and mediastinal contours are within normal limits. A right chest port terminates over the superior vena cava. Diffuse patchy airspace disease is noted in the lungs bilaterally with a basilar predominance there is a moderate right pleural effusion and a small left pleural effusion. No pneumothorax is seen. No acute osseous abnormality. IMPRESSION: 1. Diffuse airspace disease in the lungs bilaterally, possible edema or infiltrate. 2. Moderate right pleural effusion and small left pleural effusion. Electronically Signed   By: Thornell Sartorius M.D.   On: 01/26/2024 21:39    Procedures Procedures  {Document cardiac monitor, telemetry assessment procedure when appropriate:1}  Medications Ordered in ED Medications - No data to display  ED Course/ Medical Decision Making/ A&P                          Medical Decision Making Amount and/or Complexity of Data Reviewed Radiology: ordered.    This patient presents to the ED for concern of ***, this involves an extensive number of treatment options, and is a complaint that carries with it a high risk of complications and morbidity.  I considered the following differential and admission for this acute, potentially life threatening condition.   MDM:    ***  Clinical Course as of 01/26/24 2153  Mon Jan 26, 2024  2151 DG Chest 2 View 1. Diffuse airspace disease in the lungs bilaterally, possible edema or infiltrate. 2. Moderate right pleural effusion and small left pleural effusion.   [HN]    Clinical Course User Index [HN] Loetta Rough, MD    Labs: I Ordered, and personally interpreted labs.  The pertinent results include:  ***  Imaging Studies ordered: I ordered imaging studies including *** I independently visualized and interpreted imaging. I agree with the radiologist interpretation  Additional history obtained from  ***.  External records from outside source obtained and reviewed including ***  Cardiac Monitoring: .The patient was maintained on a cardiac monitor.  I personally viewed and interpreted the cardiac monitored which showed an underlying rhythm of: ***  Reevaluation: After the interventions noted above, I reevaluated the patient and found that they have :{resolved/improved/worsened:23923::"improved"}  Social Determinants of Health: .***  Disposition:  ***  Co morbidities that complicate the patient evaluation . Past Medical History:  Diagnosis Date  . Breast cancer metastasized to brain, unspecified laterality (HCC) 11/13/2020  . Breast cancer metastasized to liver, unspecified laterality (HCC) 10/08/2018  . 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 Goals of care, counseling/discussion 11/13/2020  . Narcolepsy   . Scoliosis    MILD  . Urge incontinence   . Urticaria      Medicines No orders of the defined types were placed in this encounter.   I have reviewed the patients home medicines and have made adjustments as needed  Problem  List / ED Course: Problem List Items Addressed This Visit   None        {Document critical care time when appropriate:1} {Document review of labs and clinical decision tools ie heart score, Chads2Vasc2 etc:1}  {Document your independent review of radiology images, and any outside records:1} {Document your discussion with family members, caretakers, and with consultants:1} {Document social determinants of health affecting pt's care:1} {Document your decision making why or why not admission, treatments were needed:1}  This note was created using dictation software, which may contain spelling or grammatical errors.

## 2024-01-26 NOTE — ED Triage Notes (Signed)
 Patient presents from home due to SOB for the past day. EMS noticed abdominal distention and jaundice. She recently had a thoracentesis in which about 1 L was removed.     HX: breast cancer that met to the lung and liver   EMS vitals: 90% SPO2 on room air,  99% SPO2 on 2 L O2 nasal canula 110 P 142/88 BP

## 2024-01-27 ENCOUNTER — Observation Stay (HOSPITAL_COMMUNITY)

## 2024-01-27 ENCOUNTER — Encounter (HOSPITAL_COMMUNITY): Payer: Self-pay | Admitting: Internal Medicine

## 2024-01-27 ENCOUNTER — Other Ambulatory Visit: Payer: Self-pay

## 2024-01-27 DIAGNOSIS — R54 Age-related physical debility: Secondary | ICD-10-CM | POA: Diagnosis present

## 2024-01-27 DIAGNOSIS — R609 Edema, unspecified: Secondary | ICD-10-CM

## 2024-01-27 DIAGNOSIS — J90 Pleural effusion, not elsewhere classified: Secondary | ICD-10-CM | POA: Diagnosis not present

## 2024-01-27 DIAGNOSIS — F419 Anxiety disorder, unspecified: Secondary | ICD-10-CM | POA: Diagnosis present

## 2024-01-27 DIAGNOSIS — R4589 Other symptoms and signs involving emotional state: Secondary | ICD-10-CM

## 2024-01-27 DIAGNOSIS — L89312 Pressure ulcer of right buttock, stage 2: Secondary | ICD-10-CM | POA: Diagnosis present

## 2024-01-27 DIAGNOSIS — C7931 Secondary malignant neoplasm of brain: Secondary | ICD-10-CM | POA: Diagnosis present

## 2024-01-27 DIAGNOSIS — K7460 Unspecified cirrhosis of liver: Secondary | ICD-10-CM | POA: Diagnosis present

## 2024-01-27 DIAGNOSIS — Z818 Family history of other mental and behavioral disorders: Secondary | ICD-10-CM | POA: Diagnosis not present

## 2024-01-27 DIAGNOSIS — E8809 Other disorders of plasma-protein metabolism, not elsewhere classified: Secondary | ICD-10-CM | POA: Diagnosis not present

## 2024-01-27 DIAGNOSIS — C50919 Malignant neoplasm of unspecified site of unspecified female breast: Secondary | ICD-10-CM

## 2024-01-27 DIAGNOSIS — Z833 Family history of diabetes mellitus: Secondary | ICD-10-CM | POA: Diagnosis not present

## 2024-01-27 DIAGNOSIS — R0602 Shortness of breath: Secondary | ICD-10-CM | POA: Diagnosis present

## 2024-01-27 DIAGNOSIS — R7989 Other specified abnormal findings of blood chemistry: Secondary | ICD-10-CM

## 2024-01-27 DIAGNOSIS — J9601 Acute respiratory failure with hypoxia: Secondary | ICD-10-CM | POA: Diagnosis not present

## 2024-01-27 DIAGNOSIS — Z66 Do not resuscitate: Secondary | ICD-10-CM | POA: Diagnosis not present

## 2024-01-27 DIAGNOSIS — R600 Localized edema: Secondary | ICD-10-CM | POA: Diagnosis not present

## 2024-01-27 DIAGNOSIS — J9621 Acute and chronic respiratory failure with hypoxia: Secondary | ICD-10-CM | POA: Diagnosis present

## 2024-01-27 DIAGNOSIS — C787 Secondary malignant neoplasm of liver and intrahepatic bile duct: Secondary | ICD-10-CM | POA: Diagnosis present

## 2024-01-27 DIAGNOSIS — N1330 Unspecified hydronephrosis: Secondary | ICD-10-CM | POA: Diagnosis present

## 2024-01-27 DIAGNOSIS — Z853 Personal history of malignant neoplasm of breast: Secondary | ICD-10-CM | POA: Diagnosis not present

## 2024-01-27 DIAGNOSIS — Z938 Other artificial opening status: Secondary | ICD-10-CM | POA: Diagnosis not present

## 2024-01-27 DIAGNOSIS — D509 Iron deficiency anemia, unspecified: Secondary | ICD-10-CM | POA: Diagnosis present

## 2024-01-27 DIAGNOSIS — Z825 Family history of asthma and other chronic lower respiratory diseases: Secondary | ICD-10-CM | POA: Diagnosis not present

## 2024-01-27 DIAGNOSIS — C799 Secondary malignant neoplasm of unspecified site: Secondary | ICD-10-CM

## 2024-01-27 DIAGNOSIS — Z7189 Other specified counseling: Secondary | ICD-10-CM

## 2024-01-27 DIAGNOSIS — R180 Malignant ascites: Secondary | ICD-10-CM | POA: Diagnosis not present

## 2024-01-27 DIAGNOSIS — Z8249 Family history of ischemic heart disease and other diseases of the circulatory system: Secondary | ICD-10-CM | POA: Diagnosis not present

## 2024-01-27 DIAGNOSIS — Z803 Family history of malignant neoplasm of breast: Secondary | ICD-10-CM | POA: Diagnosis not present

## 2024-01-27 DIAGNOSIS — C50912 Malignant neoplasm of unspecified site of left female breast: Secondary | ICD-10-CM | POA: Diagnosis not present

## 2024-01-27 DIAGNOSIS — Z515 Encounter for palliative care: Secondary | ICD-10-CM

## 2024-01-27 DIAGNOSIS — C7800 Secondary malignant neoplasm of unspecified lung: Secondary | ICD-10-CM | POA: Diagnosis present

## 2024-01-27 DIAGNOSIS — R64 Cachexia: Secondary | ICD-10-CM | POA: Diagnosis present

## 2024-01-27 DIAGNOSIS — Z9221 Personal history of antineoplastic chemotherapy: Secondary | ICD-10-CM | POA: Diagnosis not present

## 2024-01-27 DIAGNOSIS — Z923 Personal history of irradiation: Secondary | ICD-10-CM | POA: Diagnosis not present

## 2024-01-27 DIAGNOSIS — D75839 Thrombocytosis, unspecified: Secondary | ICD-10-CM | POA: Diagnosis present

## 2024-01-27 DIAGNOSIS — R188 Other ascites: Secondary | ICD-10-CM | POA: Diagnosis present

## 2024-01-27 DIAGNOSIS — D649 Anemia, unspecified: Secondary | ICD-10-CM | POA: Insufficient documentation

## 2024-01-27 DIAGNOSIS — J910 Malignant pleural effusion: Secondary | ICD-10-CM | POA: Diagnosis present

## 2024-01-27 LAB — CBC WITH DIFFERENTIAL/PLATELET
Abs Immature Granulocytes: 0.01 10*3/uL (ref 0.00–0.07)
Basophils Absolute: 0.1 10*3/uL (ref 0.0–0.1)
Basophils Relative: 1 %
Eosinophils Absolute: 0.4 10*3/uL (ref 0.0–0.5)
Eosinophils Relative: 7 %
HCT: 24 % — ABNORMAL LOW (ref 36.0–46.0)
Hemoglobin: 7.2 g/dL — ABNORMAL LOW (ref 12.0–15.0)
Immature Granulocytes: 0 %
Lymphocytes Relative: 19 %
Lymphs Abs: 0.9 10*3/uL (ref 0.7–4.0)
MCH: 24.3 pg — ABNORMAL LOW (ref 26.0–34.0)
MCHC: 30 g/dL (ref 30.0–36.0)
MCV: 81.1 fL (ref 80.0–100.0)
Monocytes Absolute: 0.4 10*3/uL (ref 0.1–1.0)
Monocytes Relative: 8 %
Neutro Abs: 3.2 10*3/uL (ref 1.7–7.7)
Neutrophils Relative %: 65 %
Platelets: 763 10*3/uL — ABNORMAL HIGH (ref 150–400)
RBC: 2.96 MIL/uL — ABNORMAL LOW (ref 3.87–5.11)
RDW: 17.3 % — ABNORMAL HIGH (ref 11.5–15.5)
WBC: 4.9 10*3/uL (ref 4.0–10.5)
nRBC: 0 % (ref 0.0–0.2)

## 2024-01-27 LAB — HEPATIC FUNCTION PANEL
ALT: 66 U/L — ABNORMAL HIGH (ref 0–44)
AST: 140 U/L — ABNORMAL HIGH (ref 15–41)
Albumin: 2 g/dL — ABNORMAL LOW (ref 3.5–5.0)
Alkaline Phosphatase: 545 U/L — ABNORMAL HIGH (ref 38–126)
Bilirubin, Direct: <0.1 mg/dL (ref 0.0–0.2)
Total Bilirubin: 0.7 mg/dL (ref 0.0–1.2)
Total Protein: 4.6 g/dL — ABNORMAL LOW (ref 6.5–8.1)

## 2024-01-27 LAB — RETICULOCYTES
Immature Retic Fract: 22 % — ABNORMAL HIGH (ref 2.3–15.9)
RBC.: 2.92 MIL/uL — ABNORMAL LOW (ref 3.87–5.11)
Retic Count, Absolute: 120.6 10*3/uL (ref 19.0–186.0)
Retic Ct Pct: 4.1 % — ABNORMAL HIGH (ref 0.4–3.1)

## 2024-01-27 LAB — BASIC METABOLIC PANEL
Anion gap: 9 (ref 5–15)
BUN: 17 mg/dL (ref 6–20)
CO2: 24 mmol/L (ref 22–32)
Calcium: 7.8 mg/dL — ABNORMAL LOW (ref 8.9–10.3)
Chloride: 100 mmol/L (ref 98–111)
Creatinine, Ser: 0.71 mg/dL (ref 0.44–1.00)
GFR, Estimated: >60 mL/min (ref >60)
Glucose, Bld: 62 mg/dL — ABNORMAL LOW (ref 70–99)
Potassium: 3.7 mmol/L (ref 3.5–5.1)
Sodium: 133 mmol/L — ABNORMAL LOW (ref 135–145)

## 2024-01-27 LAB — IRON AND TIBC
Iron: 15 ug/dL — ABNORMAL LOW (ref 28–170)
Saturation Ratios: 6 % — ABNORMAL LOW (ref 10.4–31.8)
TIBC: 245 ug/dL — ABNORMAL LOW (ref 250–450)
UIBC: 230 ug/dL

## 2024-01-27 LAB — VITAMIN B12: Vitamin B-12: 1756 pg/mL — ABNORMAL HIGH (ref 180–914)

## 2024-01-27 LAB — MRSA NEXT GEN BY PCR, NASAL: MRSA by PCR Next Gen: NOT DETECTED

## 2024-01-27 LAB — HIV ANTIBODY (ROUTINE TESTING W REFLEX): HIV Screen 4th Generation wRfx: NONREACTIVE

## 2024-01-27 LAB — FOLATE: Folate: 16.1 ng/mL (ref >5.9)

## 2024-01-27 LAB — I-STAT CG4 LACTIC ACID, ED: Lactic Acid, Venous: 0.7 mmol/L (ref 0.5–1.9)

## 2024-01-27 LAB — FERRITIN: Ferritin: 623 ng/mL — ABNORMAL HIGH (ref 11–307)

## 2024-01-27 MED ORDER — SODIUM CHLORIDE 0.9 % IV SOLN
2.0000 g | Freq: Once | INTRAVENOUS | Status: AC
  Administered 2024-01-27: 2 g via INTRAVENOUS
  Filled 2024-01-27: qty 12.5

## 2024-01-27 MED ORDER — HEPARIN SODIUM (PORCINE) 5000 UNIT/ML IJ SOLN
5000.0000 [IU] | Freq: Three times a day (TID) | INTRAMUSCULAR | Status: DC
  Administered 2024-01-27: 5000 [IU] via SUBCUTANEOUS
  Filled 2024-01-27 (×2): qty 1

## 2024-01-27 MED ORDER — SODIUM CHLORIDE 0.9 % IV SOLN
2.0000 g | Freq: Three times a day (TID) | INTRAVENOUS | Status: DC
  Administered 2024-01-27 – 2024-01-28 (×4): 2 g via INTRAVENOUS
  Filled 2024-01-27 (×4): qty 12.5

## 2024-01-27 MED ORDER — IBUPROFEN 200 MG PO TABS
400.0000 mg | ORAL_TABLET | Freq: Four times a day (QID) | ORAL | Status: DC | PRN
  Administered 2024-01-27: 400 mg via ORAL
  Filled 2024-01-27: qty 2

## 2024-01-27 MED ORDER — IOHEXOL 350 MG/ML SOLN
100.0000 mL | Freq: Once | INTRAVENOUS | Status: AC | PRN
  Administered 2024-01-27: 75 mL via INTRAVENOUS

## 2024-01-27 MED ORDER — ACETAMINOPHEN 325 MG PO TABS: 650.0000 mg | ORAL_TABLET | Freq: Four times a day (QID) | ORAL | Status: DC | PRN

## 2024-01-27 MED ORDER — SENNA 8.6 MG PO TABS
1.0000 | ORAL_TABLET | Freq: Two times a day (BID) | ORAL | Status: DC
  Administered 2024-01-27 – 2024-01-30 (×5): 8.6 mg via ORAL
  Filled 2024-01-27 (×5): qty 1

## 2024-01-27 MED ORDER — VANCOMYCIN HCL IN DEXTROSE 1-5 GM/200ML-% IV SOLN: 1000.0000 mg | Freq: Once | INTRAVENOUS | Status: DC

## 2024-01-27 MED ORDER — LORAZEPAM 0.5 MG PO TABS
0.5000 mg | ORAL_TABLET | Freq: Once | ORAL | Status: AC
  Administered 2024-01-27: 0.5 mg via ORAL
  Filled 2024-01-27: qty 1

## 2024-01-27 MED ORDER — SODIUM CHLORIDE (PF) 0.9 % IJ SOLN
INTRAMUSCULAR | Status: AC
  Filled 2024-01-27: qty 50

## 2024-01-27 MED ORDER — VANCOMYCIN HCL 1500 MG/300ML IV SOLN
1500.0000 mg | Freq: Once | INTRAVENOUS | Status: AC
  Administered 2024-01-27: 1500 mg via INTRAVENOUS
  Filled 2024-01-27: qty 300

## 2024-01-27 MED ORDER — BUPRENORPHINE HCL 75 MCG BU FILM: 1.0000 | ORAL_FILM | Freq: Two times a day (BID) | BUCCAL | Status: DC | PRN

## 2024-01-27 MED ORDER — ACETAMINOPHEN 650 MG RE SUPP: 650.0000 mg | Freq: Four times a day (QID) | RECTAL | Status: DC | PRN

## 2024-01-27 MED ORDER — LORAZEPAM 0.5 MG PO TABS
0.5000 mg | ORAL_TABLET | Freq: Every evening | ORAL | Status: DC | PRN
  Administered 2024-01-28 – 2024-01-30 (×2): 0.5 mg via ORAL
  Filled 2024-01-27 (×2): qty 1

## 2024-01-27 MED ORDER — VANCOMYCIN HCL 750 MG/150ML IV SOLN: 750.0000 mg | Freq: Two times a day (BID) | INTRAVENOUS | Status: DC

## 2024-01-27 MED ORDER — HEPARIN SODIUM (PORCINE) 5000 UNIT/ML IJ SOLN
5000.0000 [IU] | Freq: Three times a day (TID) | INTRAMUSCULAR | Status: DC
  Administered 2024-01-28: 5000 [IU] via SUBCUTANEOUS
  Filled 2024-01-27 (×2): qty 1

## 2024-01-27 MED ORDER — IBUPROFEN 200 MG PO TABS
200.0000 mg | ORAL_TABLET | Freq: Once | ORAL | Status: AC
  Administered 2024-01-27: 200 mg via ORAL
  Filled 2024-01-27: qty 1

## 2024-01-27 MED ORDER — PANCRELIPASE (LIP-PROT-AMYL) 12000-38000 UNITS PO CPEP
24000.0000 [IU] | ORAL_CAPSULE | Freq: Three times a day (TID) | ORAL | Status: DC
  Administered 2024-01-27 – 2024-01-30 (×7): 24000 [IU] via ORAL
  Filled 2024-01-27 (×8): qty 2

## 2024-01-27 NOTE — Progress Notes (Signed)
 Pharmacy Antibiotic Note  Taylor Flynn is a 48 y.o. female with metastatic breast cancer, undergoing chemotherapy, and recent hospitalization for pleural effusion requiring chest tube placement, admitted on 01/26/2024 with shortness of breath and chest pain. CXR showed bilateral diffuse airspace (possible edema or infiltrate), moderate right effusion, and small left pleural effusion. CT angio negative for PE; patchy bilateral pulmonary opacification could be confluent metastases or pneumonia. Pharmacy has been consulted to dose Vancomycin and Cefepime for suspected pneumonia.  Plan: Vancomycin 1500 mg IV x 1 loading dose then vancomycin 750 mg IV q12h (Goal AUC 400-550. Calculated AUC 478 using rounded Scr 0.8, wt 65.9 kg) Continue cefepime 2 gm IV q8h Monitor clinical progress, renal function, vancomycin levels as indicated F/u C&S, plan for abx LOT  Height: 5\' 4"  (162.6 cm) Weight: 65.9 kg (145 lb 4.8 oz) IBW/kg (Calculated) : 54.7  Temp (24hrs), Avg:97.3 F (36.3 C), Min:97 F (36.1 C), Max:97.6 F (36.4 C)  Recent Labs  Lab 01/26/24 2239 01/26/24 2255 01/26/24 2300 01/27/24 0024  WBC 6.1  --   --   --   CREATININE 0.84  --  0.90  --   LATICACIDVEN  --  0.9  --  0.7    Estimated Creatinine Clearance: 71.4 mL/min (by C-G formula based on SCr of 0.9 mg/dL).    Allergies  Allergen Reactions   Aspirin Swelling    Angioedema    Sulfa Antibiotics Rash    Antimicrobials this admission: 3/25 Cefepime >>   3/25 Vancomycin >>    Dose adjustments this admission: N/A  Microbiology results: 3/24 BCx:   3/24 resp panel: neg  3/25 MRSA PCR:   Thank you for allowing pharmacy to be a part of this patient's care.  Adolphus Birchwood, Student-PharmD 01/27/2024 7:27 AM

## 2024-01-27 NOTE — Consult Note (Signed)
 WOC Nurse Consult Note: Reason for Consult: Consult requested for buttocks.  Pt states she has a pressure injury which was present prior to admission. She is very emaciated and has a poor appetite and has been frequently incontinent of stools, she states.  Wound type: Stage 2 pressure injury to right inner upper buttock, .3X.3X.1cm, red and moist.  Inner gluteal fold with red moist macerated skin and partial thickness fissure related to moisture, 1X.1X.1cm, pink and moist and painful.  Pressure Injury POA: Yes Dressing procedure/placement/frequency: Topical treatment orders provided for bedside nurses to perform as follows to protect from further injury:   Foam dressing to sacrum/buttocks, change Q 3 days or PRN soiling.  If frequently stooling, then discontinue use of sacral foam dressing and use the square foam.  Please re-consult if further assistance is needed.  Thank-you,  Cammie Mcgee MSN, RN, CWOCN, [place], CNS [phone]

## 2024-01-27 NOTE — ED Notes (Signed)
 Unable to get temp, pt says her mouth is dry. Tried under the arm and was unable to get an accurate temp.

## 2024-01-27 NOTE — Progress Notes (Signed)
 Pharmacy Antibiotic Note  Taylor Flynn is a 48 y.o. female admitted on 01/26/2024 with pneumonia/acute hypoxic respiratory failure.  PMH significant for metastatic breast cancer, undergoing chemotherapy. Recent hospitalization for pleural effusion requiring chest tube placement.  Pharmacy has been consulted for Vancomycin and Cefepime dosing.  Plan: Weight unknown at this time (last weight in EPIC = 54.9 kg 06/18/22) Vancomycin 1gm IV x 1 Cefepime 2gm IV x 1 Follow renal function F/u culture results & sensitivities F/U current weight once documented in EPIC and will order continued dosing of antibiotics once renal function determined     Temp (24hrs), Avg:97.3 F (36.3 C), Min:97 F (36.1 C), Max:97.6 F (36.4 C)  Recent Labs  Lab 01/26/24 2239 01/26/24 2255 01/26/24 2300 01/27/24 0024  WBC 6.1  --   --   --   CREATININE 0.84  --  0.90  --   LATICACIDVEN  --  0.9  --  0.7    CrCl cannot be calculated (Unknown ideal weight.).    Allergies  Allergen Reactions   Aspirin Swelling    Angioedema    Sulfa Antibiotics Rash    Antimicrobials this admission: 3/25 Cefepime >>   3/25 Vancomycin >>    Dose adjustments this admission:    Microbiology results: 3/24 BCx:      Thank you for allowing pharmacy to be a part of this patient's care.  Maryellen Pile, PharmD 01/27/2024 7:02 AM

## 2024-01-27 NOTE — H&P (Signed)
 History and Physical    Taylor Flynn GNF:[id number] DOB: 14-May-1976 DOA: 01/26/2024  Patient coming from: Home.  Chief Complaint: Shortness of breath.  HPI: Taylor Flynn is a 48 y.o. female with history of metastatic breast cancer recently moved from [place] was admitted 3 weeks ago at [facility] for pleural effusion requiring chest tube placement and also was admitted again for abdominal discomfort with fluid but eventually patient decided to come to [place] to live with her family presents to the ER because of increasing shortness of breath with pleuritic chest pain since last night.  Denies any productive cough fever or chills.  Has noticed increased swelling in the right upper extremity and abdomen.  Patient is presently on trastuzumab and chemotherapy.  Patient previously used to follow with Dr. Arlan Organ oncologist and has appointment with Dr. Myna Hidalgo in the coming weeks.  ED Course: In the ER patient was requiring 3 L oxygen to maintain sats and has bilateral pleural effusion.  Given the pleuritic chest pain CT angiogram of the chest has been ordered.  Results are pending.  Labs show BNP of 47 lactic acid 0.9 hemoglobin 8 which is at baseline when compared to recent past.  LFTs elevated likely from metastasis.  Review of Systems: As per HPI, rest all negative.   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 Inguinal, but patient unsure.  Age 89   MASS EXCISION Left 11/24/2018   Procedure: EXCISION NODULE LEFT  CHEST WALL ERAS PATHWAY;  Surgeon: Claud Kelp, MD;  Location: [facility];  Service: General;  Laterality: Left;   SENTINEL NODE BIOPSY Left 2017 APRIL     reports that she has never smoked. She has never used smokeless tobacco. She reports that she does not drink alcohol and does not use drugs.  Allergies  Allergen Reactions   Aspirin Swelling    Angioedema    Sulfa Antibiotics Rash    Family History  Problem Relation Age of Onset   Aortic aneurysm Mother    Hypertension Mother    Dementia Father    Asthma Sister    Asthma Brother    Breast cancer Maternal Aunt    Diabetes Maternal Grandmother    Leukemia Maternal Grandmother    Prostate cancer Maternal Grandfather    Eczema Daughter    Prostate cancer Brother    Breast cancer Sister     Prior to Admission medications   Medication Sig Start Date End Date Taking? Authorizing Provider  Ascorbic Acid (VITAMIN C PO) Take 1 tablet by mouth daily at 6 (six) AM.   Yes [provider]  Buprenorphine HCl 75 MCG FILM Place 1 Film inside cheek 2 (two) times daily as needed (For PAin). 12/17/23  Yes [provider]  Cholecalciferol (VITAMIN D) 125 MCG (5000 UT) CAPS Take by mouth daily.   Yes [provider]  dexamethasone (DECADRON) 4 MG tablet Take 4 mg by mouth as directed.  Take 1 tablet (4 mg total)  by mouth 2 times a day with breakfast & dinner for three days starting the day after outpatient clinic chemotherapy infusion 01/14/24  Yes [provider]  ibuprofen (ADVIL) 200 MG tablet Take 200 mg by mouth every 6 (six) hours as needed for mild pain (pain score 1-3) or moderate pain (pain score 4-6).   Yes [provider]  LORazepam (ATIVAN) 0.5 MG tablet Take 0.5 mg by mouth at bedtime as needed for anxiety or sleep.   Yes [provider]  metroNIDAZOLE (METROGEL) 1 % gel Apply 1 Application topically daily. 01/13/24  Yes [provider]  NON FORMULARY 2 (two) times daily.  Pancreatic enzymes 3 caps AC and 2 caps with meals.   Yes [provider]  OLANZapine (ZYPREXA) 2.5 MG tablet Take 2.5 mg by mouth daily as needed. 01/14/24  Yes [provider]  prochlorperazine (COMPAZINE) 10 MG tablet Take 10 mg by mouth every 6 (six) hours as needed for nausea or vomiting. 01/14/24  Yes [provider]  senna (SENOKOT) 8.6 MG tablet Take 1 tablet by mouth 2 (two) times daily. 01/13/24  Yes [provider]  TURMERIC CURCUMIN PO Take 1 tablet by mouth daily at 6 (six) AM.   Yes [provider]  acetaminophen (TYLENOL) 500 MG tablet Take 500 mg by mouth every 6 (six) hours as needed for mild pain (pain score 1-3). Patient not taking: Reported on 01/27/2024 01/13/24   [provider]  MELATONIN PO Take 150 mg by mouth at bedtime. Patient not taking: Reported on 01/26/2024    [provider]  Menaquinone-7 (VITAMIN K2 PO) Take 1 tablet by mouth daily at 6 (six) AM. Patient not taking: Reported on 01/26/2024    [provider]  Multiple Vitamins-Minerals (ZINC PO) Take 1 tablet by mouth daily at 6 (six) AM. Patient not taking: Reported on 01/26/2024    [provider]  NALTREXONE HCL PO Take 4.5 mg by mouth 3 (three) times a week. Patient not taking: Reported on 01/26/2024    [provider]  OVER THE COUNTER MEDICATION Vitamin A drops-2 drops daily. Patient not taking: Reported on 01/26/2024    [provider]  PRESCRIPTION MEDICATION Inject 1 mL into the skin every Monday, Wednesday, and Friday. mistletoe Patient not taking: Reported on 01/26/2024 12/16/19   [provider]    Physical Exam: Constitutional: Moderately built and nourished. Vitals:   01/26/24 2046 01/26/24 2200 01/27/24 0128 01/27/24 0330  BP: (!) 129/91 (!) 139/91  137/87  Pulse: (!) 129 (!) 118  (!) 107  Resp: 20 (!) 21  (!) 26  Temp:   (!) 97 F (36.1 C)   TempSrc: Oral  Axillary   SpO2: 100% 100%  100%    Eyes: Anicteric no pallor. ENMT: No discharge from the ears eyes nose or mouth. Neck: No mass felt.  No neck rigidity. Respiratory: No rhonchi or crepitations. Cardiovascular: S1-S2 heard. Abdomen: Distended nontender bowel sound present. Musculoskeletal: Right upper extremity edema. Skin: No rash. Neurologic: Alert awake oriented to time place and person.  Moves all extremities. Psychiatric: Appears normal.  Normal affect.   Labs on Admission: I have personally reviewed following labs and imaging studies  CBC: Recent Labs  Lab 01/26/24 2239 01/26/24 2300  WBC 6.1  --   NEUTROABS 4.3  --   HGB 8.0* 8.8*  HCT 26.7* 26.0*  MCV 80.4  --   PLT 838*  --    Basic Metabolic Panel: Recent Labs  Lab 01/26/24 2239 01/26/24 2300  NA 133* 133*  K 3.6 3.6  CL 100 100  CO2 22  --   GLUCOSE 60* 57*  BUN 20 17  CREATININE 0.84 0.90  CALCIUM 8.1*  --    GFR: CrCl cannot be calculated (Unknown ideal weight.). Liver Function Tests: Recent Labs  Lab 01/26/24 2239  AST 152*  ALT 72*  ALKPHOS 575*  BILITOT 0.9  PROT 4.9*  ALBUMIN 2.0*   No results for input(s): "LIPASE", "AMYLASE" in the last 168 hours. No results for input(s): "AMMONIA" in the last 168 hours. Coagulation Profile: Recent Labs  Lab 01/26/24 2239  INR 1.0   Cardiac Enzymes: No results for input(s): "CKTOTAL", "CKMB", "CKMBINDEX", "TROPONINI" in the last 168 hours. BNP (last 3 results) No results for input(s): "PROBNP" in the last 8760 hours. HbA1C: No results for input(s): "HGBA1C" in the last 72 hours. CBG: No results for input(s): "GLUCAP" in the last 168 hours. Lipid Profile: No results for input(s): "CHOL", "HDL", "LDLCALC", "TRIG", "CHOLHDL", "LDLDIRECT" in the last 72 hours. Thyroid Function Tests: No results for input(s): "TSH", "T4TOTAL", "FREET4", "T3FREE", "THYROIDAB" in the last 72 hours. Anemia Panel: No results for input(s): "VITAMINB12", "FOLATE", "FERRITIN", "TIBC", "IRON",  "RETICCTPCT" in the last 72 hours. Urine analysis: No results found for: "COLORURINE", "APPEARANCEUR", "LABSPEC", "PHURINE", "GLUCOSEU", "HGBUR", "BILIRUBINUR", "KETONESUR", "PROTEINUR", "UROBILINOGEN", "NITRITE", "LEUKOCYTESUR" Sepsis Labs: @LABRCNTIP (procalcitonin:4,lacticidven:4) ) Recent Results (from the past 240 hours)  Resp panel by RT-PCR (RSV, Flu A&B, Covid) Anterior Nasal Swab     Status: None   Collection Time: 01/26/24 10:39 PM   Specimen: Anterior Nasal Swab  Result Value Ref Range Status   SARS Coronavirus 2 by RT PCR NEGATIVE NEGATIVE Final    Comment: (NOTE) SARS-CoV-2 target nucleic acids are NOT DETECTED.  The SARS-CoV-2 RNA is generally detectable in upper respiratory specimens during the acute phase of infection. The lowest concentration of SARS-CoV-2 viral copies this assay can detect is 138 copies/mL. A negative result does not preclude SARS-Cov-2 infection and should not be used as the sole basis for treatment or other patient management decisions. A negative result may occur with  improper specimen collection/handling, submission of specimen other than nasopharyngeal swab, presence of viral mutation(s) within the areas targeted by this assay, and inadequate number of viral copies(<138 copies/mL). A negative result must be combined with clinical observations, patient history, and epidemiological information. The expected result is Negative.  Fact Sheet for Patients:  BloggerCourse.com  Fact Sheet for Healthcare Providers:  SeriousBroker.it  This test is no t yet approved or cleared by the [place] FDA and  has been authorized for detection and/or diagnosis of SARS-CoV-2 by FDA under an Emergency Use Authorization (EUA). This EUA will remain  in effect (meaning this test can be used) for the duration of the COVID-19 declaration under Section 564(b)(1) of the Act, 21 U.S.C.section 360bbb-3(b)(1), unless  the authorization is terminated  or revoked sooner.       Influenza A by PCR NEGATIVE NEGATIVE Final   Influenza B by PCR NEGATIVE NEGATIVE Final    Comment: (NOTE) The Xpert Xpress SARS-CoV-2/FLU/RSV plus assay is intended as an aid in the diagnosis of influenza from Nasopharyngeal swab specimens and should not be used as a sole basis for treatment. Nasal washings and aspirates are unacceptable for Xpert Xpress SARS-CoV-2/FLU/RSV testing.  Fact Sheet for Patients: BloggerCourse.com  Fact Sheet for Healthcare Providers: SeriousBroker.it  This test is not yet approved or cleared by the [place] and  has been authorized for detection and/or diagnosis of SARS-CoV-2 by FDA under an Emergency Use Authorization (EUA). This EUA will remain in effect (meaning this test can be used) for the duration of the COVID-19 declaration under Section 564(b)(1) of the Act, 21 U.S.C. section 360bbb-3(b)(1), unless the authorization is terminated or revoked.     Resp Syncytial Virus by PCR NEGATIVE NEGATIVE Final    Comment: (NOTE) Fact Sheet for Patients: BloggerCourse.com  Fact Sheet for Healthcare Providers: SeriousBroker.it  This test is not yet approved or cleared by the [place] FDA and has been authorized for detection and/or diagnosis of SARS-CoV-2 by FDA under an Emergency Use Authorization (EUA). This EUA will remain in effect (meaning this test can be used) for the duration of the COVID-19 declaration under Section 564(b)(1) of the Act, 21 U.S.C. section 360bbb-3(b)(1), unless the authorization is terminated or revoked.  Performed at [facility], [address], [place], [place] [address]      Radiological Exams on Admission: DG Chest 2 View Result Date: 01/26/2024 CLINICAL DATA:  Shortness of breath, abdominal distention, and jaundice. Recent  thoracentesis. EXAM: CHEST - 2 VIEW COMPARISON:  03/21/2022. FINDINGS: The heart size and mediastinal contours are within normal limits. A right chest port terminates over the superior vena cava. Diffuse patchy airspace disease is noted in the lungs bilaterally with a basilar predominance there is a moderate right pleural effusion and a small left pleural effusion. No pneumothorax is seen. No acute osseous abnormality. IMPRESSION: 1. Diffuse airspace disease in the lungs bilaterally, possible edema or infiltrate. 2. Moderate right pleural effusion and small left pleural effusion. Electronically Signed   By: Thornell Sartorius M.D.   On: 01/26/2024 21:39    EKG: Independently reviewed.  Sinus tachycardia.  Assessment/Plan Principal Problem:   Acute hypoxic respiratory failure (HCC) Active Problems:   Breast cancer metastasized to liver, unspecified laterality (HCC)   Anemia   Edema of right upper extremity   Acute respiratory failure with hypoxia (HCC)    Acute respiratory failure with hypoxia secondary to bilateral pleural effusion and also possible metastasis will likely will need repeat thoracentesis.  I will consult pulmonologist since patient is having recurrent pleural effusion may need possible Pleurx.  CT angiogram of the chest is pending.  Since there is some concern for infiltrates will keep patient on antibiotics. Right upper extremity edema check Dopplers of the upper extremity.  Also checking abdominal ultrasound given the distention. Anemia appears to be chronic.  Follow CBC.  Hemoglobin at baseline when compared to recent past.  Check anemia panel. Metastatic breast cancer patient planning to follow-up with Dr. Arlan Organ oncologist. Elevated LFTs likely from better stasis.  Follow LFTs.  Since patient has respiratory failure with bilateral pleural effusion will need close monitoring further management and more than 2 midnight stay.   DVT prophylaxis: Heparin subcutaneous. Code  Status: Full code. Family Communication: Gust with patient. Disposition Plan: Monitored bed. Consults called: Pulmonologist. Admission status: Observation.

## 2024-01-27 NOTE — Consult Note (Cosign Needed)
 [facility] CONSULT NOTE  Patient Care Team: Josph Macho, MD as PCP - General (Oncology)  CHIEF COMPLAINTS/PURPOSE OF CONSULTATION:  Metastatic Breast Cancer  REFERRING PHYSICIAN: Dr. Sunnie Nielsen  HISTORY OF PRESENTING ILLNESS:  Taylor Flynn 48 y.o. female who came to the ED today 01/27/2024 complaining of increasing shortness of breath.  Patient has a history of metastatic breast cancer and has been previously treated by medical oncology/Dr. Myna Hidalgo in the past.  Indeed she had made an appointment to come and see him again and has now ended up in the hospital due to increasing shortness of breath.  Patient reports that she was in [place] and has been undergoing therapy there for her cancer, states that she had her cycle 1 of Enhertu on January 13, 2024.  Also reports that she has received radiation and hypothermia therapy in [facility]. Medical history is significant for metastatic breast cancer as stated above. Family oncologic history significant for sister and maternal aunt with breast cancer and maternal grandmother with leukemia. Social history-patient denies tobacco use, denies alcohol use, denies illicit or recreational drug use.  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     I have reviewed her chart and materials related to her cancer extensively and collaborated history with the patient. 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, unspecified laterality (HCC)   10/27/2018 - 01/13/2020 Chemotherapy         11/24/2020 - 11/24/2020 Chemotherapy         06/13/2021 - 06/13/2021 Chemotherapy   Patient is on Treatment Plan : BREAST Trastuzumab + Pertuzumab q21d     10/16/2021 - 10/16/2021 Chemotherapy   Patient is on  Treatment Plan : BREAST METASTATIC fam-trastuzumab deruxtecan-nxki (Enhertu) q21d     04/04/2022 -  Chemotherapy   Patient is on Treatment Plan : BREAST Trastuzumab q21d     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, left (HCC)  10/08/2018 Initial Diagnosis   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, left (HCC)   10/27/2018 - 01/13/2020 Chemotherapy   The patient had trastuzumab (HERCEPTIN) 300 mg in sodium chloride 0.9 % 250 mL chemo infusion, 420 mg, Intravenous,  Once, 16 of 20 cycles Dose modification: 6 mg/kg (original dose 6 mg/kg, Cycle 12, Reason: Other (see comments)), 4.5 mg/kg (original dose 6 mg/kg, Cycle 14, Reason: Other (see comments), Comment: Per MD decrease Herceptin dose by 25% per patient request.) Administration: 300 mg (10/27/2018), 300 mg (01/19/2019), 300 mg (12/08/2018), 300 mg (12/29/2018), 300 mg (03/02/2019), 300 mg (03/23/2019), 300 mg (04/21/2019), 300 mg (05/12/2019), 300 mg (06/09/2019), 300 mg (07/21/2019), 231 mg (11/23/2019), 231 mg (12/16/2019) pertuzumab (PERJETA) 420 mg in sodium chloride 0.9 % 250 mL chemo infusion, 840 mg, Intravenous, Once, 16 of 20 cycles Dose modification: 315 mg (75 % of original dose 420 mg, Cycle 15, Reason: Provider Judgment) Administration: 420 mg (10/27/2018), 420 mg (11/17/2018), 420 mg (01/19/2019), 420 mg (12/08/2018), 420 mg (12/29/2018), 420 mg (03/02/2019), 420 mg (03/23/2019), 420 mg (04/21/2019), 420 mg (05/12/2019), 420 mg (06/09/2019), 420 mg (07/21/2019), 330 mg (12/16/2019), 210 mg (01/13/2020)  for chemotherapy treatment.    11/24/2020 - 11/24/2020 Chemotherapy         06/13/2021 - 06/13/2021 Chemotherapy   Patient is on Treatment Plan : BREAST Trastuzumab + Pertuzumab q21d  10/16/2021 - 10/16/2021 Chemotherapy   Patient is on Treatment Plan : BREAST METASTATIC fam-trastuzumab deruxtecan-nxki (Enhertu) q21d     04/04/2022 -  Chemotherapy   Patient is on Treatment Plan : BREAST Trastuzumab q21d       ASSESSMENT & PLAN:  Metastatic Breast Cancer with CNS  mets, Liver mets -Initially diagnosed 10/08/2018 - Patient reports that she recently had cycle 1 of Enhertu in [place].  Also states that she has had previous radiation therapy and hypothermia therapy in [facility] prior. - Status post chemotherapy regimens as outlined above 2019 - 2023 with immuno therapy regimens including trastuzumab, Perjeta. - Medical oncology/Dr. Myna Hidalgo following  Acute hypoxic respiratory failure Shortness of breath Ascites -Reports increasing shortness of breath increasing ascites over several weeks - She denies generalized pain, nausea, vomiting, diarrhea or constipation. -Continue supportive care  Anemia, normocytic -Hemoglobin very low 7.2 - Ferritin noted to be 623 however low sat 6% - B12 elevated 1756, no repletion required - Likely due to malignancy and poor nutritional status -Recommend PRBC transfusion for hemoglobin < 7.0 - 7.5 or per symptoms. 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 Inguinal, but patient unsure.  Age 28   MASS EXCISION Left 11/24/2018   Procedure: EXCISION NODULE LEFT CHEST WALL ERAS PATHWAY;  Surgeon: Claud Kelp, MD;  Location: [facility];   Service: General;  Laterality: Left;   SENTINEL NODE BIOPSY Left 2017 APRIL    SOCIAL HISTORY: Social History   Socioeconomic History   Marital status: Single    Spouse name: Not on file   Number of children: Not on file   Years of education: Not on file   Highest education level: Not on file  Occupational History   Not on file  Tobacco Use   Smoking status: Never   Smokeless tobacco: Never  Vaping Use   Vaping status: Never Used  Substance and Sexual Activity   Alcohol use: Never   Drug use: Never   Sexual activity: Not Currently    Partners: Male    Birth control/protection: Coitus interruptus    Comment: >5 sexual partners in lifetime, first intercourse >15, hx breast cancer, no hx abnormal pap, no DES exposure  Other Topics Concern   Not on file  Social History Narrative   Not on file   Social Drivers of Health   Financial Resource Strain: Low Risk  (01/17/2024)   Received from [facility]   Overall Financial Resource Strain (CARDIA)    Difficulty of Paying Living Expenses: Not very hard  Recent Concern: Financial Resource Strain - Medium Risk (12/31/2023)   Received from [facility]   Overall Financial Resource Strain (CARDIA)  Difficulty of Paying Living Expenses: Somewhat hard  Food Insecurity: Unknown (01/17/2024)   Received from [facility]   Hunger Vital Sign    Worried About Running Out of Food in the Last Year: Never true    Ran Out of Food in the Last Year: Not on file  Transportation Needs: Unmet Transportation Needs (01/17/2024)   Received from [facility]   PRAPARE - Transportation    Lack of Transportation (Medical): Yes    Lack of Transportation (Non-Medical): Yes  Physical Activity: Not on file  Stress: Not on file  Social Connections: Unknown (03/19/2022)   Received from [facility]   Social Network    Social Network: Not on file  Intimate Partner Violence: Unknown (01/17/2024)    Received from [facility]   Humiliation, Afraid, Rape, and Kick questionnaire    Fear of Current or Ex-Partner: No    Emotionally Abused: Not on file    Physically Abused: Not on file    Sexually Abused: Not on file    FAMILY HISTORY: Family History  Problem Relation Age of Onset   Aortic aneurysm Mother    Hypertension Mother    Dementia Father    Asthma Sister    Asthma Brother    Breast cancer Maternal Aunt    Diabetes Maternal Grandmother    Leukemia Maternal Grandmother    Prostate cancer Maternal Grandfather    Eczema Daughter    Prostate cancer Brother    Breast cancer Sister     REVIEW OF SYSTEMS:   Constitutional: Denies fevers, chills or abnormal night sweats Eyes: Denies blurriness of vision, double vision or watery eyes Ears, nose, mouth, throat, and face: Denies mucositis or sore throat Respiratory: + Shortness of breath Cardiovascular: Denies palpitation, chest discomfort or lower extremity swelling Gastrointestinal: + Ascites Skin: Denies abnormal skin rashes Lymphatics: Denies new lymphadenopathy or easy bruising Neurological: Denies numbness, tingling or new weaknesses Behavioral/Psych: Mood is stable, no new changes  All other systems were reviewed with the patient and are negative.  PHYSICAL EXAMINATION: ECOG PERFORMANCE STATUS: 3 - Symptomatic, >50% confined to bed  Vitals:   01/27/24 0700 01/27/24 1106  BP: (!) 125/94 115/78  Pulse: (!) 108 (!) 125  Resp: (!) 23 (!) 28  Temp:  97.8 F (36.6 C)  SpO2: 100% 97%   Filed Weights   01/27/24 0706  Weight: 145 lb 4.8 oz (65.9 kg)    GENERAL: alert, + frail + cachectic SKIN: + Jaundiced skin color, texture, turgor are normal, no rashes or significant lesions EYES: normal, conjunctiva are pink and non-injected, sclera clear OROPHARYNX: no exudate, no erythema and lips, buccal mucosa, and tongue normal  NECK: supple, thyroid normal size, non-tender, without nodularity LYMPH: no  palpable lymphadenopathy in the cervical, axillary or inguinal LUNGS: clear to auscultation and percussion with normal breathing effort HEART: regular rate & rhythm and no murmurs and no lower extremity edema ABDOMEN: abdomen soft, non-tender and normal bowel sounds MUSCULOSKELETAL: no cyanosis of digits and no clubbing  PSYCH: alert & oriented x 3 with fluent speech NEURO: no focal motor/sensory deficits   ALLERGIES:  is allergic to aspirin and sulfa antibiotics.  MEDICATIONS:  Current Facility-Administered Medications  Medication Dose Route Frequency Provider Last Rate Last Admin   acetaminophen (TYLENOL) tablet 650 mg  650 mg Oral Q6H PRN Eduard Clos, MD       Or   acetaminophen (TYLENOL) suppository 650 mg  650 mg  Rectal Q6H PRN Eduard Clos, MD       Buprenorphine HCl FILM 1 Film  1 Film Buccal BID PRN Eduard Clos, MD       ceFEPIme (MAXIPIME) 2 g in sodium chloride 0.9 % 100 mL IVPB  2 g Intravenous Q8H Pham, Anh P, RPH       heparin injection 5,000 Units  5,000 Units Subcutaneous Q8H Eduard Clos, MD   5,000 Units at 01/27/24 0920   lipase/protease/amylase (CREON) capsule 24,000 Units  24,000 Units Oral TID AC Regalado, Belkys A, MD   24,000 Units at 01/27/24 1156   LORazepam (ATIVAN) tablet 0.5 mg  0.5 mg Oral QHS PRN Eduard Clos, MD       senna [facility]) tablet 8.6 mg  1 tablet Oral BID Eduard Clos, MD   8.6 mg at 01/27/24 0920   vancomycin (VANCOREADY) IVPB 750 mg/150 mL  750 mg Intravenous Q12H Adolphus Birchwood, Student-PharmD       Current Outpatient Medications  Medication Sig Dispense Refill   Ascorbic Acid (VITAMIN C PO) Take 1 tablet by mouth daily at 6 (six) AM.     Buprenorphine HCl 75 MCG FILM Place 1 Film inside cheek 2 (two) times daily as needed (For PAin).     Cholecalciferol (VITAMIN D) 125 MCG (5000 UT) CAPS Take by mouth daily.     dexamethasone (DECADRON) 4 MG tablet Take 4 mg by mouth as directed.  Take 1  tablet (4 mg total) by mouth 2 times a day with breakfast & dinner for three days starting the day after outpatient clinic chemotherapy infusion     ibuprofen (ADVIL) 200 MG tablet Take 200 mg by mouth every 6 (six) hours as needed for mild pain (pain score 1-3) or moderate pain (pain score 4-6).     LORazepam (ATIVAN) 0.5 MG tablet Take 0.5 mg by mouth at bedtime as needed for anxiety or sleep.     metroNIDAZOLE (METROGEL) 1 % gel Apply 1 Application topically daily.     NON FORMULARY 2 (two) times daily. Pancreatic enzymes 3 caps AC and 2 caps with meals.     OLANZapine (ZYPREXA) 2.5 MG tablet Take 2.5 mg by mouth daily as needed.     prochlorperazine (COMPAZINE) 10 MG tablet Take 10 mg by mouth every 6 (six) hours as needed for nausea or vomiting.     senna (SENOKOT) 8.6 MG tablet Take 1 tablet by mouth 2 (two) times daily.     TURMERIC CURCUMIN PO Take 1 tablet by mouth daily at 6 (six) AM.     acetaminophen (TYLENOL) 500 MG tablet Take 500 mg by mouth every 6 (six) hours as needed for mild pain (pain score 1-3). (Patient not taking: Reported on 01/27/2024)     MELATONIN PO Take 150 mg by mouth at bedtime. (Patient not taking: Reported on 01/26/2024)     Menaquinone-7 (VITAMIN K2 PO) Take 1 tablet by mouth daily at 6 (six) AM. (Patient not taking: Reported on 01/26/2024)     Multiple Vitamins-Minerals (ZINC PO) Take 1 tablet by mouth daily at 6 (six) AM. (Patient not taking: Reported on 01/26/2024)     NALTREXONE HCL PO Take 4.5 mg by mouth 3 (three) times a week. (Patient not taking: Reported on 01/26/2024)     OVER THE COUNTER MEDICATION Vitamin A drops-2 drops daily. (Patient not taking: Reported on 01/26/2024)     PRESCRIPTION MEDICATION Inject 1 mL into the skin every Monday, Wednesday, and  Friday. mistletoe (Patient not taking: Reported on 01/26/2024)       LABORATORY DATA:  I have reviewed the data as listed Lab Results  Component Value Date   WBC 4.9 01/27/2024   HGB 7.2 (L) 01/27/2024    HCT 24.0 (L) 01/27/2024   MCV 81.1 01/27/2024   PLT 763 (H) 01/27/2024   Recent Labs    01/26/24 2239 01/26/24 2300 01/27/24 0654  NA 133* 133* 133*  K 3.6 3.6 3.7  CL 100 100 100  CO2 22  --  24  GLUCOSE 60* 57* 62*  BUN 20 17 17   CREATININE 0.84 0.90 0.71  CALCIUM 8.1*  --  7.8*  GFRNONAA >60  --  >60  PROT 4.9*  --  4.6*  ALBUMIN 2.0*  --  2.0*  AST 152*  --  140*  ALT 72*  --  66*  ALKPHOS 575*  --  545*  BILITOT 0.9  --  0.7  BILIDIR  --   --  <0.1  IBILI  --   --  NOT CALCULATED    RADIOGRAPHIC STUDIES: I have personally reviewed the radiological images as listed and agreed with the findings in the report. VAS [place] UPPER EXTREMITY VENOUS DUPLEX Result Date: 01/27/2024 UPPER VENOUS STUDY  Patient Name:  LAVINA RESOR  Date of Exam:   01/27/2024 Medical Rec #: [id number]     Accession #:    [id number] Date of Birth: 1976/06/14      Patient Gender: F Patient Age:   49 years Exam Location:  [facility] Procedure:      VAS [place] UPPER EXTREMITY VENOUS DUPLEX Referring Phys: Midge Minium --------------------------------------------------------------------------------  Indications: Edema Risk Factors: Cancer. Limitations: Line and Right subclavian port. Comparison Study: No prior studies. Performing Technologist: Chanda Busing RVT  Examination Guidelines: A complete evaluation includes B-mode imaging, spectral Doppler, color Doppler, and power Doppler as needed of all accessible portions of each vessel. Bilateral testing is considered an integral part of a complete examination. Limited examinations for reoccurring indications may be performed as noted.  Right Findings: +----------+------------+---------+-----------+----------+--------------+ RIGHT     CompressiblePhasicitySpontaneousProperties   Summary     +----------+------------+---------+-----------+----------+--------------+ IJV           Full       Yes       Yes                              +----------+------------+---------+-----------+----------+--------------+ Subclavian                                          Not visualized +----------+------------+---------+-----------+----------+--------------+ Axillary      Full       Yes       Yes                             +----------+------------+---------+-----------+----------+--------------+ Brachial      Full                                                 +----------+------------+---------+-----------+----------+--------------+ Radial        Full                                                 +----------+------------+---------+-----------+----------+--------------+  Ulnar         Full                                                 +----------+------------+---------+-----------+----------+--------------+ Cephalic      Full                                                 +----------+------------+---------+-----------+----------+--------------+ Basilic       Full                                                 +----------+------------+---------+-----------+----------+--------------+  Left Findings: +----------+------------+---------+-----------+----------+-------+ LEFT      CompressiblePhasicitySpontaneousPropertiesSummary +----------+------------+---------+-----------+----------+-------+ Subclavian    Full       Yes       Yes                      +----------+------------+---------+-----------+----------+-------+  Summary:  Right: No evidence of deep vein thrombosis in the upper extremity. No evidence of superficial vein thrombosis in the upper extremity.  Left: No evidence of thrombosis in the subclavian.  *See table(s) above for measurements and observations.  Diagnosing physician: Carolynn Sayers Electronically signed by Carolynn Sayers on 01/27/2024 at 12:19:24 PM.    Final    US Abdomen Complete Result Date: 01/27/2024 CLINICAL DATA:  [id number] Abdominal pain [id number] EXAM: ABDOMEN ULTRASOUND  COMPLETE COMPARISON:  None Available. FINDINGS: Gallbladder: The gallbladder is only partially visualized and appears partially contracted. Disease sludge within the gallbladder. Mild wall thickening in the visualized portion. The technologist noted a negative Murphy sign. Common bile duct: Diameter: Up to 3 mm. Intrahepatic bile duct dilation. Liver: There is diffuse liver surface nodularity and heterogeneous echotexture/echogenicity, compatible with cirrhotic configuration. No discrete focal mass seen on this limited exam. Portal vein is patent on color Doppler imaging with normal direction of blood flow towards the liver. IVC: No abnormality in the visualized portion. Pancreas: No abnormality in the visualized portion. Spleen: Size and appearance within normal limits. Right Kidney: Length: 8.5 cm. Echogenicity within normal limits. There is mild fullness in the collecting system. No mass visualized. Left Kidney: Length: 9.6 cm. Echogenicity within normal limits. There is mild moderate hydronephrosis. No mass visualized. Abdominal aorta: No aneurysm visualized. Other findings: Note is made of mild to moderate ascites. IMPRESSION: 1. Cirrhotic liver configuration. No discrete focal liver lesion seen on this limited exam. Mild to moderate ascites. No splenomegaly. 2. Mild fullness in the right renal collecting system. Mild-to-moderate left hydronephrosis. 3. Partially contracted gallbladder with sludge. No sonographic evidence of acute cholecystitis. Electronically Signed   By: Jules Schick M.D.   On: 01/27/2024 10:32   CT Angio Chest Pulmonary Embolism (PE) W or WO Contrast Result Date: 01/27/2024 CLINICAL DATA:  Pulmonary embolism suspected, high probability. Recent thoracentesis. History of metastatic breast cancer. EXAM: CT ANGIOGRAPHY CHEST WITH CONTRAST TECHNIQUE: Multidetector CT imaging of the chest was performed using the standard protocol during bolus administration of intravenous contrast.  Multiplanar CT image reconstructions and MIPs were obtained to evaluate the vascular anatomy. RADIATION DOSE REDUCTION: This exam was  performed according to the departmental dose-optimization program which includes automated exposure control, adjustment of the mA and/or kV according to patient size and/or use of iterative reconstruction technique. CONTRAST:  75mL OMNIPAQUE IOHEXOL 350 MG/ML SOLN COMPARISON:  03/21/2022 PET-CT. FINDINGS: Cardiovascular: Satisfactory opacification of the pulmonary arteries to the segmental level. No evidence of pulmonary embolism. Normal heart size. No pericardial effusion. Unremarkable positioning of right-sided porta catheter. Mediastinum/Nodes: Negative for mediastinal mass. Bilateral breast skin thickening and subcutaneous reticulation. Indeterminate reticulation at the bilateral axilla without discrete enlarged lymph node. Lungs/Pleura: Irregular nodules throughout the lungs which are numerous. Areas of more broad opacification in the lower lungs which could be confluent metastases or areas of pneumonia. Large bilateral pleural effusion. It is difficult to detect discrete pleural nodularity or thickening on this arterial study. Upper Abdomen: Widespread hepatic masses compatible with metastatic disease. The partially covered left kidney is hydronephrotic. Abdominal ascites which is moderate. Musculoskeletal: No acute finding Review of the MIP images confirms the above findings. IMPRESSION: 1. Negative for pulmonary embolism. 2. Widespread pulmonary and hepatic metastatic disease. Patchy bilateral pulmonary opacification could be confluent metastases or pneumonia. 3. Ascites and large bilateral pleural effusion. 4. Left hydronephrosis. Electronically Signed   By: Tiburcio Pea M.D.   On: 01/27/2024 05:55   DG Chest 2 View Result Date: 01/26/2024 CLINICAL DATA:  Shortness of breath, abdominal distention, and jaundice. Recent thoracentesis. EXAM: CHEST - 2 VIEW COMPARISON:   03/21/2022. FINDINGS: The heart size and mediastinal contours are within normal limits. A right chest port terminates over the superior vena cava. Diffuse patchy airspace disease is noted in the lungs bilaterally with a basilar predominance there is a moderate right pleural effusion and a small left pleural effusion. No pneumothorax is seen. No acute osseous abnormality. IMPRESSION: 1. Diffuse airspace disease in the lungs bilaterally, possible edema or infiltrate. 2. Moderate right pleural effusion and small left pleural effusion. Electronically Signed   By: Thornell Sartorius M.D.   On: 01/26/2024 21:39     The total time spent in the appointment was 55 minutes encounter with patients including review of chart and various tests results, discussions about plan of care and coordination of care plan   All questions were answered. The patient knows to call the clinic with any problems, questions or concerns. No barriers to learning was detected.  Dawson Bills, NP 3/25/202512:55 PM  ADDENDUM: I saw Dr. Logan Bores this morning.  I have not seen her for about a year and a half.  She clearly has declined significantly.  She has been up in [place] for the past 20 months.  Apparently she has a daughter up there.  She definitely has declined significantly since I last seen her.  Her disease certainly is a lot worse.  She has had thoracentesis of in [place].  She only has been treated with Enhertu.  I really question whether or not the Enhertu has not anything for her.  I probably would not use Enhertu given that the main toxicity is pulmonary.  I am not sure how it is going to work out with her wanting treatment and also getting HOSPICE.  I have never had a patient who has had active treatment while on Hospice.  [facility] to be able to treat patients.  I am not aware of this.  I think the real issue is that she still wants to be full code.  I think this is  certainly  not in her best interest.  I think if she were to go on life support, she would never come off.  I think the CPR we tried on her, it would break ribs and she would probably internally.  However, we we will certainly respect her wishes.  There is still studies that need to be done.  Apparently, she is going to have a Pleurx catheter placed.  She really needs to be transfused in my opinion.  However, she wants to avoid transfusion.  She definitely needs iron.  She is not sure that she wants any iron.  I am unsure as to what the hesitation is for getting iron.  I know that ever since we started seeing her, that she has done things her way and this probably has been a little bit challenging and this could certainly have affected how the cancer has progressed.  However, she has was been very confident in what she has felt would be best for her.  I think we just need to see how everything goes for the next couple days.  I will continue to work on addressing the CODE STATUS.  I know that her mom, who is with her, does not wish to see her be kept alive artificially.  I know Dr. Logan Bores has always been very focused on her care.  She is now retired from [employer].  Again, I am not certain that working to be able to have her on Hospice and do treatment for her cancer.  I just do not know how this is going to work.  I am not sure that a Hospice agency down here would be willing to have her get treated.  I know that she will get great care from everybody upon [address].   Christin Bach. MD  Fayrene Fearing 1:5

## 2024-01-27 NOTE — Progress Notes (Signed)
 PROGRESS NOTE    Taylor Flynn  ZOX:[id number] DOB: 04-29-1976 DOA: 01/26/2024 PCP: Josph Macho, MD   Brief Narrative: 48 year old with past medical history significant for metastatic breast cancer recently moved from [place], was admitted 3 weeks ago at [facility] for pleural effusion requiring chest tube placement and was having abdominal pain.  Patient decided to move to [place] to live with her family, presents with increased shortness of breath, pleuritic chest pain.  Reports abdominal distention.  She required 3 L of oxygen in the ED to maintain oxygen saturation, CT angio chest was ordered, showed large bilateral pleural effusion and widespread pulmonary and hepatic metastasis.  Palliative care, oncology and pulmonology is consulted.  Pulmonologist recommend Pleurx catheter placement.  IR has been consulted.   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 -CTA negative for PE, widespread pulmonary and hepatic metastasis.  Patchy bilateral pulmonary opacification could be confluent metastasis or pneumonia.  Ascites and large bilateral pleural effusion. -Placed on 3 L of oxygen supplementation.  Reports some improvement of shortness of breath. -Pulmonology is consulted, recommended Pleurx catheter placement.  IR has been consulted for placement. -Discontinue  vancomycin, MRSA PCR negative.  Continue with cefepime for now, follow final blood cultures results, pleural fluid.  Right upper extremity edema: Doppler ordered  Anemia; iron deficiency and malignancy -Patient reports that she does not want to get any IV iron. -Continue to monitor hemoglobin, transfusion as needed  Metastatic breast cancer, initial diagnosis 2019 Metastasis  to lung and liver: Transaminases -Continue buprenorphine twice daily as needed for pain -Continue with Ativan daily at bedtime as needed for anxiety -Dr. Myna Hidalgo consulted he will see in consultation -Palliative care has been consulted.  Patient wishes to proceed with concurrent care through the [place], continue with Hospice and chemotherapy.    Abdominal distention and transaminases: [place]: Cirrhotic liver configuration mild to moderate ascites.  Mild to moderate left hydronephrosis.  Partially contracted gallbladder with sludge.  No sonographic evidence of acute cholecystitis. -May need paracentesis  Stage II pressure injury right inner upper buttock and intergluteal fold macerated skin: Pressure injury present on admission: Wound care consulted continue local care  Thrombocytosis: May be reactive  Estimated body mass index is 24.94 kg/m as calculated from the following:   Height as of this encounter: 5\' 4"  (1.626 m).   Weight as of this encounter: 65.9 kg.   DVT prophylaxis: Heparin  Code Status: Full code Family Communication: Care discussed with patient Disposition Plan:  Status is: Observation The patient will require care spanning > 2 midnights and should be moved to inpatient because: Management of hypoxia, ascites    Consultants:  Pulmonology Dr Myna Hidalgo.   Procedures:  Pleurx catheter  Antimicrobials:    Subjective: She is alert and conversant, she reports that she follow a keto diet.  Usually her blood sugar is low.  She is breathing better now that she was placed on oxygen.  She reports  abdominal distention.  Objective: Vitals:   01/27/24 0330 01/27/24 0530 01/27/24 0700 01/27/24 0706  BP: 137/87 122/85 (!) 125/94   Pulse: (!) 107 (!) 106 (!) 108   Resp: (!) 26 14 (!) 23   Temp:  97.6 F (36.4 C)    TempSrc:  Axillary    SpO2: 100% 100% 100%   Weight:    65.9 kg  Height:   5\' 4"  (1.626 m) 5\' 4"  (1.626 m)   No intake or output data in the 24 hours ending 01/27/24 0816 Filed Weights   01/27/24 0706  Weight: 65.9 kg    Examination:  General exam: Thin appearing Respiratory system: Clear to auscultation. Respiratory effort normal. Cardiovascular system: S1 & S2 heard, RRR.  Gastrointestinal system: Abdomen is distend, mild tenderness Central nervous system: Alert and oriented.  Extremities: no edema  Data Reviewed: I have personally reviewed following labs and imaging studies  CBC: Recent Labs  Lab 01/26/24 2239 01/26/24 2300 01/27/24 0654  WBC 6.1  --  4.9  NEUTROABS 4.3  --  3.2  HGB 8.0* 8.8* 7.2*  HCT 26.7* 26.0* 24.0*  MCV 80.4  --  81.1  PLT 838*  --  763*   Basic Metabolic Panel: Recent Labs  Lab 01/26/24 2239 01/26/24 2300  NA 133* 133*  K 3.6 3.6  CL 100 100  CO2 22  --   GLUCOSE 60* 57*  BUN 20 17  CREATININE 0.84 0.90  CALCIUM 8.1*  --    GFR: Estimated Creatinine Clearance: 71.4 mL/min (by C-G formula based on SCr of 0.9 mg/dL). Liver Function Tests: Recent Labs  Lab 01/26/24 2239  AST 152*  ALT 72*  ALKPHOS 575*  BILITOT 0.9  PROT 4.9*  ALBUMIN 2.0*   No results for input(s): "LIPASE", "AMYLASE" in the last 168 hours. No results for input(s): "AMMONIA" in the last 168 hours. Coagulation Profile: Recent Labs  Lab 01/26/24 2239  INR 1.0   Cardiac Enzymes: No results for input(s): "CKTOTAL", "CKMB", "CKMBINDEX", "TROPONINI" in the last 168 hours. BNP (last 3 results) No results for input(s): "PROBNP" in the last 8760 hours. HbA1C: No results for input(s): "HGBA1C" in the last 72 hours. CBG: No results for  input(s): "GLUCAP" in the last 168 hours. Lipid Profile: No results for input(s): "CHOL", "HDL", "LDLCALC", "TRIG", "CHOLHDL", "LDLDIRECT" in the last 72 hours. Thyroid Function Tests: No results for input(s): "TSH", "T4TOTAL", "FREET4", "T3FREE", "THYROIDAB" in the last 72 hours. Anemia Panel: Recent Labs    01/27/24 0654  RETICCTPCT 4.1*   Sepsis Labs: Recent Labs  Lab 01/26/24 2255 01/27/24 0024  LATICACIDVEN 0.9 0.7    Recent Results (from the past 240 hours)  Resp panel by RT-PCR (RSV, Flu A&B, Covid) Anterior Nasal Swab     Status: None   Collection Time: 01/26/24 10:39 PM   Specimen: Anterior Nasal Swab  Result Value Ref Range Status   SARS Coronavirus 2 by RT PCR NEGATIVE NEGATIVE Final    Comment: (NOTE) SARS-CoV-2 target nucleic acids are NOT DETECTED.  The SARS-CoV-2 RNA is generally detectable in upper respiratory specimens during the acute phase of infection. The lowest concentration of SARS-CoV-2 viral copies this assay can detect is 138 copies/mL. A negative result does not preclude SARS-Cov-2 infection and should not be used as the sole basis for treatment or other patient management decisions. A negative result may occur with  improper specimen collection/handling, submission of specimen other than nasopharyngeal swab, presence of viral mutation(s) within the areas targeted  by this assay, and inadequate number of viral copies(<138 copies/mL). A negative result must be combined with clinical observations, patient history, and epidemiological information. The expected result is Negative.  Fact Sheet for Patients:  BloggerCourse.com  Fact Sheet for Healthcare Providers:  SeriousBroker.it  This test is no t yet approved or cleared by the [place] FDA and  has been authorized for detection and/or diagnosis of SARS-CoV-2 by FDA under an Emergency Use Authorization (EUA). This EUA will remain  in effect  (meaning this test can be used) for the duration of the COVID-19 declaration under Section 564(b)(1) of the Act, 21 U.S.C.section 360bbb-3(b)(1), unless the authorization is terminated  or revoked sooner.       Influenza A by PCR NEGATIVE NEGATIVE Final   Influenza B by PCR NEGATIVE NEGATIVE Final    Comment: (NOTE) The Xpert Xpress SARS-CoV-2/FLU/RSV plus assay is intended as an aid in the diagnosis of influenza from Nasopharyngeal swab specimens and should not be used as a sole basis for treatment. Nasal washings and aspirates are unacceptable for Xpert Xpress SARS-CoV-2/FLU/RSV testing.  Fact Sheet for Patients: BloggerCourse.com  Fact Sheet for Healthcare Providers: SeriousBroker.it  This test is not yet approved or cleared by the [place] FDA and has been authorized for detection and/or diagnosis of SARS-CoV-2 by FDA under an Emergency Use Authorization (EUA). This EUA will remain in effect (meaning this test can be used) for the duration of the COVID-19 declaration under Section 564(b)(1) of the Act, 21 U.S.C. section 360bbb-3(b)(1), unless the authorization is terminated or revoked.     Resp Syncytial Virus by PCR NEGATIVE NEGATIVE Final    Comment: (NOTE) Fact Sheet for Patients: BloggerCourse.com  Fact Sheet for Healthcare Providers: SeriousBroker.it  This test is not yet approved or cleared by the [place] FDA and has been authorized for detection and/or diagnosis of SARS-CoV-2 by FDA under an Emergency Use Authorization (EUA). This EUA will remain in effect (meaning this test can be used) for the duration of the COVID-19 declaration under Section 564(b)(1) of the Act, 21 U.S.C. section 360bbb-3(b)(1), unless the authorization is terminated or revoked.  Performed at [facility], [address] [place], [place] [address]           Radiology Studies: CT Angio Chest Pulmonary Embolism (PE) W or WO Contrast Result Date: 01/27/2024 CLINICAL DATA:  Pulmonary embolism suspected, high probability. Recent thoracentesis. History of metastatic breast cancer. EXAM: CT ANGIOGRAPHY CHEST WITH CONTRAST TECHNIQUE: Multidetector CT imaging of the chest was performed using the standard protocol during bolus administration of intravenous contrast. Multiplanar CT image reconstructions and MIPs were obtained to evaluate the vascular anatomy. RADIATION DOSE REDUCTION: This exam was performed according to the departmental dose-optimization program which includes automated exposure control, adjustment of the mA and/or kV according to patient size and/or use of iterative reconstruction technique. CONTRAST:  75mL OMNIPAQUE IOHEXOL 350 MG/ML SOLN COMPARISON:  03/21/2022 PET-CT. FINDINGS: Cardiovascular: Satisfactory opacification of the pulmonary arteries to the segmental level. No evidence of pulmonary embolism. Normal heart size. No pericardial effusion. Unremarkable positioning of right-sided porta catheter. Mediastinum/Nodes: Negative for mediastinal mass. Bilateral breast skin thickening and subcutaneous reticulation. Indeterminate reticulation at the bilateral axilla without discrete enlarged lymph node. Lungs/Pleura: Irregular nodules throughout the lungs which are numerous. Areas of more broad opacification in the lower lungs which could be confluent metastases or areas of pneumonia. Large bilateral pleural effusion. It is difficult to detect discrete pleural nodularity or thickening on this arterial study. Upper  Abdomen: Widespread hepatic masses compatible with metastatic disease. The partially covered left kidney is hydronephrotic. Abdominal ascites which is moderate. Musculoskeletal: No acute finding Review of the MIP images confirms the above findings. IMPRESSION: 1. Negative for pulmonary embolism. 2. Widespread pulmonary and hepatic  metastatic disease. Patchy bilateral pulmonary opacification could be confluent metastases or pneumonia. 3. Ascites and large bilateral pleural effusion. 4. Left hydronephrosis. Electronically Signed   By: Tiburcio Pea M.D.   On: 01/27/2024 05:55   DG Chest 2 View Result Date: 01/26/2024 CLINICAL DATA:  Shortness of breath, abdominal distention, and jaundice. Recent thoracentesis. EXAM: CHEST - 2 VIEW COMPARISON:  03/21/2022. FINDINGS: The heart size and mediastinal contours are within normal limits. A right chest port terminates over the superior vena cava. Diffuse patchy airspace disease is noted in the lungs bilaterally with a basilar predominance there is a moderate right pleural effusion and a small left pleural effusion. No pneumothorax is seen. No acute osseous abnormality. IMPRESSION: 1. Diffuse airspace disease in the lungs bilaterally, possible edema or infiltrate. 2. Moderate right pleural effusion and small left pleural effusion. Electronically Signed   By: Thornell Sartorius M.D.   On: 01/26/2024 21:39        Scheduled Meds:  heparin  5,000 Units Subcutaneous Q8H   senna  1 tablet Oral BID   Continuous Infusions:  ceFEPime (MAXIPIME) IV     vancomycin       LOS: 0 days    Time spent: 35 Minutes    Diaz Crago A Iylah Dworkin, MD [facility]   If 7PM-7AM, please contact night-coverage [email or web address]  01/27/2024, 8:16 AM

## 2024-01-27 NOTE — ED Notes (Signed)
 Patient stated she had a bed sore on her sacrum and could feel the dressing move. I took the old dressing off and applied a sacrum foam dressing. Patient stated that felt a lot better. Got patient situated and told her to call if needed anything. Patient is currently trying to rest and sleep.

## 2024-01-27 NOTE — Consult Note (Signed)
 Consultation Note Date: 01/27/2024   Patient Name: Taylor Flynn  DOB: 1976-02-21  MRN: [id number]  Age / Sex: 48 y.o., female   PCP: Josph Macho, MD Referring Physician: Alba Cory, MD  Reason for Consultation: Establishing goals of care     Chief Complaint/History of Present Illness:   Patient is a 48 year old female with a past medical history of metastatic breast cancer who presented to ED on 01/26/2024 for management of worsening shortness of breath with pleuritic chest pain.  Patient had recently moved from [place], was receiving care there, to [place] for more family support.  Patient has been receiving chemotherapy and plans to follow-up with Dr. Myna Hidalgo in the outpatient setting.  Upon admission, patient noted to have acute respiratory failure with hypoxia secondary to bilateral pleural infusions seen on imaging.  Imaging also revealed metastatic disease to lung and liver, ascites noted, and left hydronephrosis..  Palliative medicine team consulted to assist with complex medical decision making.  Extensive review of EMR prior to presenting to bedside.  Reviewed ultrasound of upper extremities which did not reveal any DVT in setting of swelling.  Review of CMP noted creatinine of 0.71, GFR of > 60, alk phos of 545, and albumin of 2.  Personally reviewed CT chest imaging noting multiple sites of metastatic disease to lungs.  Presented to bedside to meet with patient in ER.  No family present at bedside.  With permission introduced myself as a member of the palliative medicine team and my role in patient's medical journey.  Patient noted she was familiar with palliative medicine.  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  Noted appreciation of palliative medicine involvement.  Spent time discussing patient's medical journey.  Patient was able to describe that she had recently moved from [place] where she was getting  chemotherapy which she understands is palliative in nature.  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  Patient is 100% service-connected and had been getting 58 additional hours of care through the [place].  Patient also noted that she was receiving concurrent care which means that she can receive hospice while getting palliative chemotherapy.  Patient hopes to continue with current care as is her benefit being a veteran.  Patient stated that she has had difficulty finding a hospice in [place] that we will acknowledge current care.  Patient stated that she spoke with [facility] and they will not provide concurrent care.  Patient noted that she is attempting to work with [facility] though also having difficulty with obtaining concurrent care through them.  Patient noted that she is already approved through the [place] to get concurrent care.  Patient has a Industrial/product designer phone number [phone], at the [facility] to help coordinate with a hospice when found.  Acknowledged and noted would involve TOC and assist with coordination of care.  Discussed if patient had ACP documentation patient noted that she did from her active days in the [employer].  Patient noted that her sister, Yoland Scherr, is her HCPOA.  Patient would like to update her ACP documentation since she has a child who is 61 years old now.  Noted would involve chaplain to assist with this.  Patient also confirmed that she would like full CODE STATUS at this time.  Patient stating that she would "like a chance" and it is "not over till its over".  Then spent time discussing symptom management at this  time.  Patient currently has buprenorphine 1 film ordered twice daily as needed.  Patient acknowledges that she does take medication as needed as she does not have consistent pain.  Patient wanting to continue with as needed dosing.  Patient noted her primary concern was her shortness of breath which she hopes will be improved  with Pleurx placement.  Patient also acknowledged ascites which has accumulated over the past 2-3 weeks.  Noted that best management for ascites related discomfort is paracenteses for potential Pleurx.  Also making sure patient is not constipated to worsen abdominal discomfort.  Since obtaining pulmonary Pleurx at this time, only pursuing 1 at a time.  May need to consider abdominal Pleurx in the outpatient setting.   Patient also noted that she has a sacral wound which was present on admission.  Patient noted concerned about this worsening.  Noted would inform hospitalist for involvement of wound care. Patient noted that she is trying more homeopathic methods outside of the hospital.  Discussed activities for assistance with this including box breathing.  Patient noted that she does have as needed Ativan at nighttime which she would like to continue until she can determine if she would like to try a more natural medication.  Discussed use of olanzapine in cancer patients.  Patient will consider if would like to start this.  Spent time answering questions as able.  Noted palliative medicine team continue to follow with patient's medical journey.  Discussed care with IDT including hospitalist, oncologist, RN, and hospice liaison from hosp[facility] of the [place] as determined they would be able to except the patient with concurrent care.  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, left (HCC) 10/08/2018   Goals of care, counseling/discussion 11/13/2020   Narcolepsy    Scoliosis    MILD   Urge incontinence    Urticaria     Social History   Socioeconomic History   Marital status: Single    Spouse name: Not on file   Number of children: Not on file   Years of education: Not on file   Highest education level: Not on file  Occupational History   Not on file  Tobacco Use   Smoking status: Never   Smokeless tobacco: Never  Vaping Use   Vaping status: Never Used  Substance and Sexual Activity   Alcohol use: Never   Drug use: Never   Sexual activity: Not Currently    Partners: Male    Birth control/protection: Coitus interruptus    Comment: >5 sexual partners in lifetime, first intercourse >15, hx breast cancer, no hx abnormal pap, no DES exposure  Other Topics Concern   Not on file  Social History Narrative   Not on file   Social Drivers of Health   Financial Resource Strain: Low Risk  (01/17/2024)   Received from [facility]   Overall Financial Resource Strain (CARDIA)    Difficulty of Paying Living Expenses: Not very hard  Recent Concern: Financial Resource Strain - Medium Risk (12/31/2023)   Received from [facility]   Overall Financial Resource Strain (CARDIA)    Difficulty of Paying Living Expenses: Somewhat hard  Food  Insecurity: Unknown (01/17/2024)   Received from [facility]   Hunger Vital Sign    Worried About Running Out of Food in the Last Year: Never true    Ran Out of Food in the Last Year: Not on file  Transportation Needs: Unmet Transportation Needs (01/17/2024)   Received from [facility]   PRAPARE - Transportation    Lack of Transportation (Medical): Yes    Lack of Transportation (Non-Medical): Yes  Physical Activity: Not on file  Stress: Not on file  Social Connections: Unknown (03/19/2022)   Received from [facility]   Social Network    Social Network: Not on file   Family History  Problem Relation Age of Onset   Aortic aneurysm Mother    Hypertension Mother    Dementia Father    Asthma Sister    Asthma  Brother    Breast cancer Maternal Aunt    Diabetes Maternal Grandmother    Leukemia Maternal Grandmother    Prostate cancer Maternal Grandfather    Eczema Daughter    Prostate cancer Brother    Breast cancer Sister    Scheduled Meds:  heparin  5,000 Units Subcutaneous Q8H   lipase/protease/amylase  24,000 Units Oral TID AC   senna  1 tablet Oral BID   Continuous Infusions:  ceFEPime (MAXIPIME) IV     vancomycin     vancomycin     PRN Meds:.acetaminophen **OR** acetaminophen, Buprenorphine HCl, LORazepam Allergies  Allergen Reactions   Aspirin Swelling    Angioedema    Sulfa Antibiotics Rash   CBC:    Component Value Date/Time   WBC 4.9 01/27/2024 0654   HGB 7.2 (L) 01/27/2024 0654   HGB 11.0 (L) 06/18/2022 0945   HCT 24.0 (L) 01/27/2024 0654   PLT 763 (H) 01/27/2024 0654   PLT 238 06/18/2022 0945   MCV 81.1 01/27/2024 0654   NEUTROABS 3.2 01/27/2024 0654   LYMPHSABS 0.9 01/27/2024 0654   MONOABS 0.4 01/27/2024 0654   EOSABS 0.4 01/27/2024 0654   BASOSABS 0.1 01/27/2024 0654   Comprehensive Metabolic Panel:    Component Value Date/Time   NA 133 (L) 01/27/2024 0654   K 3.7 01/27/2024 0654   CL 100 01/27/2024 0654   CO2 24 01/27/2024 0654   BUN 17 01/27/2024 0654   CREATININE 0.71 01/27/2024 0654   CREATININE 0.93 06/18/2022 0945   GLUCOSE 62 (L) 01/27/2024 0654   CALCIUM 7.8 (L) 01/27/2024 0654   AST 140 (H) 01/27/2024 0654   AST 19 06/18/2022 0945   ALT 66 (H) 01/27/2024 0654   ALT 10 06/18/2022 0945   ALKPHOS 545 (H) 01/27/2024 0654   BILITOT 0.7 01/27/2024 0654   BILITOT 0.4 06/18/2022 0945   PROT 4.6 (L) 01/27/2024 0654   ALBUMIN 2.0 (L) 01/27/2024 0654    Physical Exam: Vital Signs: BP (!) 125/94   Pulse (!) 108   Temp 97.6 F (36.4 C) (Axillary)   Resp (!) 23   Ht 5\' 4"  (1.626 m)   Wt 65.9 kg   LMP 12/07/2018   SpO2 100%   BMI 24.94 kg/m  SpO2: SpO2: 100 % O2 Device:   O2 Flow Rate:   Intake/output summary:  Intake/Output Summary  (Last 24 hours) at 01/27/2024 0957 Last data filed at 01/27/2024 0956 Gross per 24 hour  Intake 100 ml  Output --  Net 100 ml   LBM:   Baseline Weight: Weight: 65.9 kg Most recent weight: Weight: 65.9 kg  General: NAD, alert, cachectic, frail, chronically ill-appearing Cardiovascular: Tachycardia noted Respiratory: no increased work of breathing noted, not in respiratory distress Abdomen: distended Neuro: A&Ox4, following commands easily Psych: appropriately answers all questions          Palliative Performance Scale: 50%               Additional Data Reviewed: Recent Labs    01/26/24 2239 01/26/24 2300 01/27/24 0654  WBC 6.1  --  4.9  HGB 8.0* 8.8* 7.2*  PLT 838*  --  763*  NA 133* 133* 133*  BUN 20 17 17   CREATININE 0.84 0.90 0.71    Imaging: VAS [place] UPPER EXTREMITY VENOUS DUPLEX UPPER VENOUS STUDY    Patient Name:  TRESSIA LABRUM  Date of Exam:   01/27/2024 Medical Rec #: [id number]     Accession #:    [id number] Date of Birth: [birth year]      Patient Gender: F Patient Age:   54 years Exam Location:  [facility] Procedure:      VAS [place] UPPER EXTREMITY VENOUS DUPLEX Referring Phys: Midge Minium  --------------------------------------------------------------------------------   Indications: Edema Risk Factors: Cancer. Limitations: Line and Right subclavian port. Comparison Study: No prior studies.  Performing Technologist: Chanda Busing RVT    Examination Guidelines: A complete evaluation includes B-mode imaging, spectral Doppler, color Doppler, and power Doppler as needed of all accessible portions of each vessel. Bilateral testing is considered an integral part of a complete examination. Limited examinations for reoccurring indications may be performed as noted.    Right Findings: +----------+------------+---------+-----------+----------+--------------+ RIGHT     CompressiblePhasicitySpontaneousProperties   Summary      +----------+------------+---------+-----------+----------+--------------+ IJV           Full       Yes       Yes                             +----------+------------+---------+-----------+----------+--------------+ Subclavian                                          Not visualized +----------+------------+---------+-----------+----------+--------------+ Axillary      Full       Yes       Yes                             +----------+------------+---------+-----------+----------+--------------+ Brachial      Full                                                 +----------+------------+---------+-----------+----------+--------------+ Radial        Full                                                 +----------+------------+---------+-----------+----------+--------------+ Ulnar         Full                                                 +----------+------------+---------+-----------+----------+--------------+  Cephalic      Full                                                 +----------+------------+---------+-----------+----------+--------------+ Basilic       Full                                                 +----------+------------+---------+-----------+----------+--------------+    Left Findings: +----------+------------+---------+-----------+----------+-------+ LEFT      CompressiblePhasicitySpontaneousPropertiesSummary +----------+------------+---------+-----------+----------+-------+ Subclavian    Full       Yes       Yes                      +----------+------------+---------+-----------+----------+-------+    Summary:   Right: No evidence of deep vein thrombosis in the upper extremity. No evidence of superficial vein thrombosis in the upper extremity.   Left: No evidence of thrombosis in the subclavian.    *See table(s) above for measurements and observations.         Preliminary   CT Angio  Chest Pulmonary Embolism (PE) W or WO Contrast CLINICAL DATA:  Pulmonary embolism suspected, high probability. Recent thoracentesis. History of metastatic breast cancer.  EXAM: CT ANGIOGRAPHY CHEST WITH CONTRAST  TECHNIQUE: Multidetector CT imaging of the chest was performed using the standard protocol during bolus administration of intravenous contrast. Multiplanar CT image reconstructions and MIPs were obtained to evaluate the vascular anatomy.  RADIATION DOSE REDUCTION: This exam was performed according to the departmental dose-optimization program which includes automated exposure control, adjustment of the mA and/or kV according to patient size and/or use of iterative reconstruction technique.  CONTRAST:  75mL OMNIPAQUE IOHEXOL 350 MG/ML SOLN  COMPARISON:  03/21/2022 PET-CT.  FINDINGS: Cardiovascular: Satisfactory opacification of the pulmonary arteries to the segmental level. No evidence of pulmonary embolism. Normal heart size. No pericardial effusion. Unremarkable positioning of right-sided porta catheter.  Mediastinum/Nodes: Negative for mediastinal mass. Bilateral breast skin thickening and subcutaneous reticulation. Indeterminate reticulation at the bilateral axilla without discrete enlarged lymph node.  Lungs/Pleura: Irregular nodules throughout the lungs which are numerous. Areas of more broad opacification in the lower lungs which could be confluent metastases or areas of pneumonia. Large bilateral pleural effusion. It is difficult to detect discrete pleural nodularity or thickening on this arterial study.  Upper Abdomen: Widespread hepatic masses compatible with metastatic disease. The partially covered left kidney is hydronephrotic. Abdominal ascites which is moderate.  Musculoskeletal: No acute finding  Review of the MIP images confirms the above findings.  IMPRESSION: 1. Negative for pulmonary embolism. 2. Widespread pulmonary and hepatic metastatic  disease. Patchy bilateral pulmonary opacification could be confluent metastases or pneumonia. 3. Ascites and large bilateral pleural effusion. 4. Left hydronephrosis.  Electronically Signed   By: Tiburcio Pea M.D.   On: 01/27/2024 05:55    I personally reviewed recent imaging.   Palliative Care Assessment and Plan Summary of Established Goals of Care and Medical Treatment Preferences   Patient is a 48 year old female with a past medical history of metastatic breast cancer who presented to ED on 01/26/2024 for management of worsening shortness of breath with pleuritic chest pain.  Patient had recently moved from [place], was receiving care there, to [facility]  for more family support.  Patient has been receiving chemotherapy and plans to follow-up with Dr. Myna Hidalgo in the outpatient setting.  Upon admission, patient noted to have acute respiratory failure with hypoxia secondary to bilateral pleural infusions seen on imaging.  Imaging also revealed metastatic disease to lung and liver, ascites noted, and left hydronephrosis..  Palliative medicine team consulted to assist with complex medical decision making.  # Complex medical decision making/goals of care  -Extensive discussion with patient as detailed above in HPI.  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  Patient electing that she wants to continue with concurrent care as is her right as a veteran who is 100% service-connected.  Patient is able to receive hospice support while receiving palliative cancer directed therapies as a benefit through the [place].  Patient noted that she has already been approved by [facility] for hospice care to continue with cancer directed therapies.  Patient's VA representative at [facility] is [place] # [phone].  Patient has tried to coordinate with multiple hospices with difficulty as they do not acknowledge current current status.  Was able to reach out to [facility] the [place] liaison who  noted that they would support concurrent status and can hopefully assist with coordination of care with the [place].  Continuing appropriate medical interventions at this time.  -Patient wanting to update ACP documentation.  Patient notes that her prior ACP documentation states her sister, Jurnie Garritano, is her HCPOA.  Patient noted that her eldest child is now 48 years old so she would like to update information.  Have consulted chaplain to assist.  -  Code Status: Full Code    -Patient confirmed wanting full CODE STATUS.  # Symptom management Patient is receiving these palliative interventions for symptom management with an intent to improve quality of life.   -Pain, in setting of metastatic breast cancer   -Confirmed with patient.  Continuing buprenorphine 1 film twice daily as needed   -May need to consider paracentesis or Pleurx for abdomen in future due to ascites   -Shortness of breath   -Pulmonary involved.  Considering Pleurx placement.   -Anxiety   -Continue Ativan tab 0.5 mg twice daily as needed   -Discussed with patient consideration of olanzapine to assist with mood, sleep, and appetite.  Patient considering.  # Psycho-social/Spiritual Support:  - Support System: Sister, children  -Consulted chaplain to assist with completion of ACP documentation  # Discharge Planning:  To Be Determined  Thank you for allowing the palliative care team to participate in the care [place].  Alvester Morin, DO Palliative Care Provider PMT # [phone]  If patient remains symptomatic despite maximum doses, please call PMT at [phone] between 0700 and 1900. Outside of these hours, please call attending, as PMT does not have night coverage.  Personally spent 85 minutes in patient care including extensive chart review (labs, imaging, progress/consult notes, vital signs), medically appropraite exam, discussed with treatment team, education to patient, family, and staff, documenting clinical  information, medication review and management, coordination of care, and available advanced directive documents.

## 2024-01-27 NOTE — Progress Notes (Signed)
   01/27/24 1540  Spiritual Encounters  Type of Visit Attempt (pt unavailable)  Reason for visit Advance directives  OnCall Visit No   Attempted consult visit for HCPOA, patient was asleep.

## 2024-01-27 NOTE — Progress Notes (Signed)
 Right upper extremity venous duplex has been completed. Preliminary results can be found in CV Proc through chart review.   01/27/24 8:49 AM Olen Cordial RVT

## 2024-01-27 NOTE — ED Notes (Signed)
 Pt asleep.

## 2024-01-27 NOTE — Consult Note (Signed)
 Chief Complaint: Dyspnea, Bil Pleural effusion, hx Breast cancer. Pt referred for bilateral PleurX catheter placement.  Referring Provider(s): Dr. Vassie Loll, pulmonology  Supervising Physician: Irish Lack  Patient Status: [facility] - ED  History of Present Illness: Taylor Flynn is a 48 y.o. female pmhx metastatic breast cancer, recently moved to the area from [place], presenting with worsening dyspnea. Was admitted 3 weeks prior at [facility] for pleural effusion that required chest tube. Presented to the ED here at [facility] 01/27/24 and had CTA chest showing large bil pleural effusions w/ additional findings of pulmonary and liver metastasis. Pulmonology was consulted and recommended Pleurx catheter with IR.   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 Inguinal, but patient unsure.  Age 50   MASS EXCISION Left 11/24/2018   Procedure: EXCISION NODULE LEFT CHEST WALL ERAS PATHWAY;  Surgeon: Claud Kelp, MD;  Location: [facility];  Service: General;  Laterality: Left;   SENTINEL NODE BIOPSY Left 2017 APRIL    Allergies: Aspirin and Sulfa antibiotics  Medications: Prior to Admission medications   Medication Sig Start Date End Date Taking? Authorizing Provider  Ascorbic Acid (VITAMIN C PO) Take 1 tablet by mouth daily at 6 (six) AM.   Yes [provider]  Buprenorphine HCl 75 MCG FILM Place 1 Film inside cheek 2 (two) times daily as needed (For PAin). 12/17/23  Yes [provider]   Cholecalciferol (VITAMIN D) 125 MCG (5000 UT) CAPS Take by mouth daily.   Yes [provider]  dexamethasone (DECADRON) 4 MG tablet Take 4 mg by mouth as directed.  Take 1 tablet (4 mg total) by mouth 2 times a day with breakfast & dinner for three days starting the day after outpatient clinic chemotherapy infusion 01/14/24  Yes [provider]  ibuprofen (ADVIL) 200 MG tablet Take 200 mg by mouth every 6 (six) hours as needed for mild pain (pain score 1-3) or moderate pain (pain score 4-6).   Yes [provider]  LORazepam (ATIVAN) 0.5 MG tablet Take 0.5 mg by mouth at bedtime as needed for anxiety or sleep.   Yes [provider]  metroNIDAZOLE (METROGEL) 1 % gel Apply 1 Application topically daily. 01/13/24  Yes [provider]  NON FORMULARY 2 (two) times daily. Pancreatic enzymes 3 caps AC and 2 caps with meals.   Yes [provider]  OLANZapine (ZYPREXA) 2.5 MG tablet Take 2.5 mg by mouth daily as needed. 01/14/24  Yes [provider]  prochlorperazine (COMPAZINE) 10 MG tablet Take 10 mg by mouth every 6 (six) hours as needed for nausea or vomiting. 01/14/24  Yes [provider]  senna (SENOKOT) 8.6 MG tablet Take 1 tablet by mouth 2 (two) times daily. 01/13/24  Yes [provider]  TURMERIC CURCUMIN PO Take 1 tablet by mouth daily at 6 (six) AM.   Yes [provider]  acetaminophen (TYLENOL) 500 MG tablet Take 500 mg by mouth every 6 (  six) hours as needed for mild pain (pain score 1-3). Patient not taking: Reported on 01/27/2024 01/13/24   [provider]  MELATONIN PO Take 150 mg by mouth at bedtime. Patient not taking: Reported on 01/26/2024    [provider]  Menaquinone-7 (VITAMIN K2 PO) Take 1 tablet by mouth daily at 6 (six) AM. Patient not taking: Reported on 01/26/2024    [provider]  Multiple Vitamins-Minerals (ZINC PO) Take 1 tablet by mouth daily at 6 (six) AM. Patient  not taking: Reported on 01/26/2024    [provider]  NALTREXONE HCL PO Take 4.5 mg by mouth 3 (three) times a week. Patient not taking: Reported on 01/26/2024    [provider]  OVER THE COUNTER MEDICATION Vitamin A drops-2 drops daily. Patient not taking: Reported on 01/26/2024    [provider]  PRESCRIPTION MEDICATION Inject 1 mL into the skin every Monday, Wednesday, and Friday. mistletoe Patient not taking: Reported on 01/26/2024 12/16/19   [provider]     Family History  Problem Relation Age of Onset   Aortic aneurysm Mother    Hypertension Mother    Dementia Father    Asthma Sister    Asthma Brother    Breast cancer Maternal Aunt    Diabetes Maternal Grandmother    Leukemia Maternal Grandmother    Prostate cancer Maternal Grandfather    Eczema Daughter    Prostate cancer Brother    Breast cancer Sister     Social History   Socioeconomic History   Marital status: Single    Spouse name: Not on file   Number of children: Not on file   Years of education: Not on file   Highest education level: Not on file  Occupational History   Not on file  Tobacco Use   Smoking status: Never   Smokeless tobacco: Never  Vaping Use   Vaping status: Never Used  Substance and Sexual Activity   Alcohol use: Never   Drug use: Never   Sexual activity: Not Currently    Partners: Male    Birth control/protection: Coitus interruptus    Comment: >5 sexual partners in lifetime, first intercourse >15, hx breast cancer, no hx abnormal pap, no DES exposure  Other Topics Concern   Not on file  Social History Narrative   Not on file   Social Drivers of Health   Financial Resource Strain: Low Risk  (01/17/2024)   Received from [facility]   Overall Financial Resource Strain (CARDIA)    Difficulty of Paying Living Expenses: Not very hard  Recent Concern: Financial Resource Strain - Medium Risk (12/31/2023)   Received from [facility]    Overall Financial Resource Strain (CARDIA)    Difficulty of Paying Living Expenses: Somewhat hard  Food Insecurity: Unknown (01/17/2024)   Received from [facility]   Hunger Vital Sign    Worried About Running Out of Food in the Last Year: Never true    Ran Out of Food in the Last Year: Not on file  Transportation Needs: Unmet Transportation Needs (01/17/2024)   Received from [facility]   PRAPARE - Transportation    Lack of Transportation (Medical): Yes    Lack of Transportation (Non-Medical): Yes  Physical Activity: Not on file  Stress: Not on file  Social Connections: Unknown (03/19/2022)   Received from [facility]   Social Network    Social Network: Not on file  Review of Systems: A 12 point ROS discussed and pertinent positives are indicated in the HPI above.  All other systems are negative.  Review of Systems  Respiratory:  Positive for shortness of breath.     Vital Signs: BP (!) 119/90   Pulse (!) 118   Temp 97.8 F (36.6 C) (Oral)   Resp (!) 30   Ht 5\' 4"  (1.626 m)   Wt 145 lb 4.8 oz (65.9 kg)   LMP 12/07/2018   SpO2 100%   BMI 24.94 kg/m   Advance Care Plan: no documents on file  Physical Exam Vitals and nursing note reviewed.  Constitutional:      Comments: Frail appearing, appears chronically ill  HENT:     Head: Normocephalic and atraumatic.     Mouth/Throat:     Mouth: Mucous membranes are moist.     Pharynx: Oropharynx is clear.  Cardiovascular:     Rate and Rhythm: Tachycardia present.  Pulmonary:     Breath sounds: Examination of the right-middle field reveals decreased breath sounds. Examination of the left-middle field reveals decreased breath sounds. Examination of the right-lower field reveals decreased breath sounds and rales. Examination of the left-lower field reveals decreased breath sounds and rales. Decreased breath sounds and rales present.  Chest:     Comments: Port noted in right chest  wall. Appears intact, no surrounding erythema, swelling, drainage. No tenderness. Abdominal:     Palpations: Abdomen is soft.     Tenderness: There is no abdominal tenderness.  Skin:    Comments: Appears mildly jaundiced  Neurological:     General: No focal deficit present.     Mental Status: She is alert and oriented to person, place, and time.     Imaging: VAS [place] UPPER EXTREMITY VENOUS DUPLEX Result Date: 01/27/2024 UPPER VENOUS STUDY  Patient Name:  VONYA OHALLORAN  Date of Exam:   01/27/2024 Medical Rec #: [id number]     Accession #:    [id number] Date of Birth: November 20, 1975      Patient Gender: F Patient Age:   64 years Exam Location:  [facility] Procedure:      VAS [place] UPPER EXTREMITY VENOUS DUPLEX Referring Phys: Midge Minium --------------------------------------------------------------------------------  Indications: Edema Risk Factors: Cancer. Limitations: Line and Right subclavian port. Comparison Study: No prior studies. Performing Technologist: Chanda Busing RVT  Examination Guidelines: A complete evaluation includes B-mode imaging, spectral Doppler, color Doppler, and power Doppler as needed of all accessible portions of each vessel. Bilateral testing is considered an integral part of a complete examination. Limited examinations for reoccurring indications may be performed as noted.  Right Findings: +----------+------------+---------+-----------+----------+--------------+ RIGHT     CompressiblePhasicitySpontaneousProperties   Summary     +----------+------------+---------+-----------+----------+--------------+ IJV           Full       Yes       Yes                             +----------+------------+---------+-----------+----------+--------------+ Subclavian                                          Not visualized +----------+------------+---------+-----------+----------+--------------+ Axillary      Full       Yes       Yes                              +----------+------------+---------+-----------+----------+--------------+  Brachial      Full                                                 +----------+------------+---------+-----------+----------+--------------+ Radial        Full                                                 +----------+------------+---------+-----------+----------+--------------+ Ulnar         Full                                                 +----------+------------+---------+-----------+----------+--------------+ Cephalic      Full                                                 +----------+------------+---------+-----------+----------+--------------+ Basilic       Full                                                 +----------+------------+---------+-----------+----------+--------------+  Left Findings: +----------+------------+---------+-----------+----------+-------+ LEFT      CompressiblePhasicitySpontaneousPropertiesSummary +----------+------------+---------+-----------+----------+-------+ Subclavian    Full       Yes       Yes                      +----------+------------+---------+-----------+----------+-------+  Summary:  Right: No evidence of deep vein thrombosis in the upper extremity. No evidence of superficial vein thrombosis in the upper extremity.  Left: No evidence of thrombosis in the subclavian.  *See table(s) above for measurements and observations.  Diagnosing physician: Carolynn Sayers Electronically signed by Carolynn Sayers on 01/27/2024 at 12:19:24 PM.    Final    US Abdomen Complete Result Date: 01/27/2024 CLINICAL DATA:  [id number] Abdominal pain [id number] EXAM: ABDOMEN ULTRASOUND COMPLETE COMPARISON:  None Available. FINDINGS: Gallbladder: The gallbladder is only partially visualized and appears partially contracted. Disease sludge within the gallbladder. Mild wall thickening in the visualized portion. The technologist noted a negative Murphy sign. Common bile duct:  Diameter: Up to 3 mm. Intrahepatic bile duct dilation. Liver: There is diffuse liver surface nodularity and heterogeneous echotexture/echogenicity, compatible with cirrhotic configuration. No discrete focal mass seen on this limited exam. Portal vein is patent on color Doppler imaging with normal direction of blood flow towards the liver. IVC: No abnormality in the visualized portion. Pancreas: No abnormality in the visualized portion. Spleen: Size and appearance within normal limits. Right Kidney: Length: 8.5 cm. Echogenicity within normal limits. There is mild fullness in the collecting system. No mass visualized. Left Kidney: Length: 9.6 cm. Echogenicity within normal limits. There is mild moderate hydronephrosis. No mass visualized. Abdominal aorta: No aneurysm visualized. Other findings: Note is made of mild to moderate ascites. IMPRESSION: 1. Cirrhotic liver configuration. No discrete focal liver lesion seen on this limited exam. Mild to moderate ascites. No  splenomegaly. 2. Mild fullness in the right renal collecting system. Mild-to-moderate left hydronephrosis. 3. Partially contracted gallbladder with sludge. No sonographic evidence of acute cholecystitis. Electronically Signed   By: Jules Schick M.D.   On: 01/27/2024 10:32   CT Angio Chest Pulmonary Embolism (PE) W or WO Contrast Result Date: 01/27/2024 CLINICAL DATA:  Pulmonary embolism suspected, high probability. Recent thoracentesis. History of metastatic breast cancer. EXAM: CT ANGIOGRAPHY CHEST WITH CONTRAST TECHNIQUE: Multidetector CT imaging of the chest was performed using the standard protocol during bolus administration of intravenous contrast. Multiplanar CT image reconstructions and MIPs were obtained to evaluate the vascular anatomy. RADIATION DOSE REDUCTION: This exam was performed according to the departmental dose-optimization program which includes automated exposure control, adjustment of the mA and/or kV according to patient size  and/or use of iterative reconstruction technique. CONTRAST:  75mL OMNIPAQUE IOHEXOL 350 MG/ML SOLN COMPARISON:  03/21/2022 PET-CT. FINDINGS: Cardiovascular: Satisfactory opacification of the pulmonary arteries to the segmental level. No evidence of pulmonary embolism. Normal heart size. No pericardial effusion. Unremarkable positioning of right-sided porta catheter. Mediastinum/Nodes: Negative for mediastinal mass. Bilateral breast skin thickening and subcutaneous reticulation. Indeterminate reticulation at the bilateral axilla without discrete enlarged lymph node. Lungs/Pleura: Irregular nodules throughout the lungs which are numerous. Areas of more broad opacification in the lower lungs which could be confluent metastases or areas of pneumonia. Large bilateral pleural effusion. It is difficult to detect discrete pleural nodularity or thickening on this arterial study. Upper Abdomen: Widespread hepatic masses compatible with metastatic disease. The partially covered left kidney is hydronephrotic. Abdominal ascites which is moderate. Musculoskeletal: No acute finding Review of the MIP images confirms the above findings. IMPRESSION: 1. Negative for pulmonary embolism. 2. Widespread pulmonary and hepatic metastatic disease. Patchy bilateral pulmonary opacification could be confluent metastases or pneumonia. 3. Ascites and large bilateral pleural effusion. 4. Left hydronephrosis. Electronically Signed   By: Tiburcio Pea M.D.   On: 01/27/2024 05:55   DG Chest 2 View Result Date: 01/26/2024 CLINICAL DATA:  Shortness of breath, abdominal distention, and jaundice. Recent thoracentesis. EXAM: CHEST - 2 VIEW COMPARISON:  03/21/2022. FINDINGS: The heart size and mediastinal contours are within normal limits. A right chest port terminates over the superior vena cava. Diffuse patchy airspace disease is noted in the lungs bilaterally with a basilar predominance there is a moderate right pleural effusion and a small left  pleural effusion. No pneumothorax is seen. No acute osseous abnormality. IMPRESSION: 1. Diffuse airspace disease in the lungs bilaterally, possible edema or infiltrate. 2. Moderate right pleural effusion and small left pleural effusion. Electronically Signed   By: Thornell Sartorius M.D.   On: 01/26/2024 21:39    Labs:  CBC: Recent Labs    01/26/24 2239 01/26/24 2300 01/27/24 0654  WBC 6.1  --  4.9  HGB 8.0* 8.8* 7.2*  HCT 26.7* 26.0* 24.0*  PLT 838*  --  763*    COAGS: Recent Labs    01/26/24 2239  INR 1.0  APTT 30    BMP: Recent Labs    01/26/24 2239 01/26/24 2300 01/27/24 0654  NA 133* 133* 133*  K 3.6 3.6 3.7  CL 100 100 100  CO2 22  --  24  GLUCOSE 60* 57* 62*  BUN 20 17 17   CALCIUM 8.1*  --  7.8*  CREATININE 0.84 0.90 0.71  GFRNONAA >60  --  >60    LIVER FUNCTION TESTS: Recent Labs    01/26/24 2239 01/27/24 0654  BILITOT 0.9 0.7  AST  152* 140*  ALT 72* 66*  ALKPHOS 575* 545*  PROT 4.9* 4.6*  ALBUMIN 2.0* 2.0*    TUMOR MARKERS: No results for input(s): "AFPTM", "CEA", "CA199", "CHROMGRNA" in the last 8760 hours.  Assessment and Plan:  Pt with hx of metastatic breast cancer, here with worsening dyspnea and large bilateral pleural effusions. Pt referred to IR from pulmonology for bilateral Pleurx catheter placement.  Discussed case with Dr. Milford Cage who reviewed imaging and approved. Will proceed with right Pleurx catheter first as right effusion larger on imaging, plan for this tomorrow (01/28/24), followed by left side at a later date.   Evaluated patient in the ED, reviewed case and discussed all risks and benefits regarding procedure. Pt voices understanding and agreement with plan for Pleurx catheter placements.    Thank you for allowing our service to participate in Jesalyn Finazzo 's care.  Electronically Signed: Katheren Puller, PA-C  Caryn Bee Allred,PA-C 01/27/2024, 2:55 PM      I spent a total of 40 Minutes   in face to face in clinical  consultation, greater than 50% of which was counseling/coordinating care for bilateral PleurX catheter placements.

## 2024-01-27 NOTE — Consult Note (Signed)
 NAME:  Taylor Flynn, MRN:  [id number], DOB:  22-Jan-1976, LOS: 0 ADMISSION DATE:  01/26/2024, CONSULTATION DATE:  01/27/24 REFERRING MD:  Sunnie Nielsen, CHIEF COMPLAINT:  pleural effusion    History of Present Illness:  Taylor Flynn is a 48 y.o. F with PMH of metastatic breast Ca most recently treated with cycle 1 of Enhertu on January 13, 2024 and previously treated by Dr. Myna Hidalgo before moving to [facility] and receiving treatment there.  She was hospitalized there three weeks ago with bilateral pleural effusions that required bilateral chest tube placement.  She has decided to move back to [place] to be closer to family.  Over the last few days she noted worsening shortness of breath and pleuritic CP so presented to the ED.  She was found to have re-accumulation of bilateral effusions therefore PCCM was consulted.  She has a scheduled follow up appt with Dr. Myna Hidalgo.  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   Significant Hospital Events: Including procedures, antibiotic start and stop dates in addition to other pertinent events   3/25 admit to [facility] with re-accumulation of bilateral malignant effusions, IR consult for pleurx  Interim History / Subjective:  As above   Objective   Blood pressure (!) 125/94, pulse (!) 108, temperature 97.8 F (36.6 C), temperature source Oral, resp. rate (!) 23, height 5\' 4"  (1.626 m), weight 65.9 kg, last menstrual period 12/07/2018, SpO2 100%.        Intake/Output Summary (Last 24 hours) at 01/27/2024 1127 Last data filed at 01/27/2024 0630 Gross per 24 hour   Intake 100 ml  Output --  Net 100 ml   Filed Weights   01/27/24 0706  Weight: 65.9 kg   General:  thin, poorly nourished F resting in bed in NAD HEENT: MM pink/moist Neuro: alert and oriented, non-focal  CV: s1s2 rrr, no m/r/g PULM:  diminished air entry bilateral bases, shallow inspiration, no severe distress on Sellers GI: soft, diffuse upper abdominal TTP, mildly distended  Extremities: warm/dry, no edema  Skin: no rashes or lesions   Resolved Hospital Problem list     Assessment & Plan:    Acute on chronic hypoxic respiratory failure secondary to bilateral malignant pleural effusions Pleurx risks and benefits were discussed with patient along with home care, pt has support at home and would like to proceed -plan for bilateral pleurx catheter placement by IR later this week -PCCM will follow for pleurx care -palliative care and oncology are following -rest of plan per primary team -continue supplemental O2 to maintain sats >92%    Best Practice (right click and "Reselect all SmartList Selections" daily)  Per primary team    Labs   CBC: Recent Labs  Lab 01/26/24 2239 01/26/24 2300 01/27/24 0654  WBC 6.1  --  4.9  NEUTROABS 4.3  --  3.2  HGB 8.0* 8.8* 7.2*  HCT 26.7* 26.0* 24.0*  MCV 80.4  --  81.1  PLT 838*  --  763*    Basic Metabolic Panel: Recent Labs  Lab 01/26/24 2239 01/26/24 2300 01/27/24 0654  NA 133* 133* 133*  K 3.6 3.6 3.7  CL 100 100 100  CO2 22  --  24  GLUCOSE 60* 57* 62*  BUN 20 17 17   CREATININE 0.84 0.90 0.71  CALCIUM 8.1*  --  7.8*   GFR: Estimated Creatinine Clearance: 80.4 mL/min (by C-G formula based on SCr of 0.71 mg/dL). Recent Labs  Lab 01/26/24 2239 01/26/24 2255 01/27/24 0024 01/27/24 0654  WBC 6.1  --   --  4.9  LATICACIDVEN  --  0.9 0.7  --     Liver Function Tests: Recent Labs  Lab 01/26/24 2239 01/27/24 0654  AST 152* 140*  ALT 72* 66*  ALKPHOS 575* 545*  BILITOT 0.9 0.7  PROT 4.9* 4.6*  ALBUMIN  2.0* 2.0*   No results for input(s): "LIPASE", "AMYLASE" in the last 168 hours. No results for input(s): "AMMONIA" in the last 168 hours.  ABG    Component Value Date/Time   HCO3 26.0 01/26/2024 2239   TCO2 22 01/26/2024 2300   O2SAT 66 01/26/2024 2239     Coagulation Profile: Recent Labs  Lab 01/26/24 2239  INR 1.0    Cardiac Enzymes: No results for input(s): "CKTOTAL", "CKMB", "CKMBINDEX", "TROPONINI" in the last 168 hours.  HbA1C: No results found for: "HGBA1C"  CBG: No results for input(s): "GLUCAP" in the last 168 hours.  Review of Systems:   Please see the history of present illness. 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   Surgical History:   Past Surgical History:  Procedure Laterality Date   BREAST BIOPSY Left 01/2016   CESAREAN SECTION  2012   HERNIA REPAIR     ? Inguinal, but patient unsure.  Age 71   MASS EXCISION Left 11/24/2018   Procedure: EXCISION NODULE LEFT CHEST WALL ERAS PATHWAY;  Surgeon: Claud Kelp, MD;  Location: [facility];  Service: General;  Laterality: Left;   SENTINEL NODE BIOPSY Left 2017 APRIL     Social History:   reports that she has never smoked. She has never used smokeless tobacco. She reports that she does not drink alcohol and does not use drugs.   Family History:  Her family history includes Aortic aneurysm in her mother; Asthma in her brother and sister; Breast cancer in her maternal aunt and sister; Dementia in her father; Diabetes in her maternal grandmother; Eczema in her daughter; Hypertension in her mother; Leukemia in her maternal  grandmother; Prostate cancer in her brother and maternal grandfather.   Allergies Allergies  Allergen Reactions   Aspirin Swelling    Angioedema    Sulfa Antibiotics Rash     Home Medications  Prior to Admission medications   Medication Sig Start Date End Date Taking? Authorizing Provider  Ascorbic Acid (VITAMIN C PO) Take 1 tablet by mouth daily at 6 (six) AM.   Yes [provider]  Buprenorphine HCl 75 MCG FILM Place 1 Film inside cheek 2 (two) times daily as needed (For PAin). 12/17/23  Yes [provider]  Cholecalciferol (VITAMIN D) 125 MCG (5000 UT) CAPS Take by mouth daily.   Yes [provider]  dexamethasone (DECADRON) 4 MG tablet Take 4 mg by mouth as directed.  Take 1 tablet (4 mg total) by mouth 2 times a day with breakfast & dinner for three days starting the  day after outpatient clinic chemotherapy infusion 01/14/24  Yes [provider]  ibuprofen (ADVIL) 200 MG tablet Take 200 mg by mouth every 6 (six) hours as needed for mild pain (pain score 1-3) or moderate pain (pain score 4-6).   Yes [provider]  LORazepam (ATIVAN) 0.5 MG tablet Take 0.5 mg by mouth at bedtime as needed for anxiety or sleep.   Yes [provider]  metroNIDAZOLE (METROGEL) 1 % gel Apply 1 Application topically daily. 01/13/24  Yes [provider]  NON FORMULARY 2 (two) times daily. Pancreatic enzymes 3 caps AC and 2 caps with meals.   Yes [provider]  OLANZapine (ZYPREXA) 2.5 MG tablet Take 2.5 mg by mouth daily as needed. 01/14/24  Yes [provider]  prochlorperazine (COMPAZINE) 10 MG tablet Take 10 mg by mouth every 6 (six) hours as needed for nausea or vomiting. 01/14/24  Yes [provider]  senna (SENOKOT) 8.6 MG tablet Take 1 tablet by mouth 2 (two) times daily. 01/13/24  Yes [provider]  TURMERIC CURCUMIN PO Take 1 tablet by mouth daily at 6 (six) AM.   Yes [provider]   acetaminophen (TYLENOL) 500 MG tablet Take 500 mg by mouth every 6 (six) hours as needed for mild pain (pain score 1-3). Patient not taking: Reported on 01/27/2024 01/13/24   [provider]  MELATONIN PO Take 150 mg by mouth at bedtime. Patient not taking: Reported on 01/26/2024    [provider]  Menaquinone-7 (VITAMIN K2 PO) Take 1 tablet by mouth daily at 6 (six) AM. Patient not taking: Reported on 01/26/2024    [provider]  Multiple Vitamins-Minerals (ZINC PO) Take 1 tablet by mouth daily at 6 (six) AM. Patient not taking: Reported on 01/26/2024    [provider]  NALTREXONE HCL PO Take 4.5 mg by mouth 3 (three) times a week. Patient not taking: Reported on 01/26/2024    [provider]  OVER THE COUNTER MEDICATION Vitamin A drops-2 drops daily. Patient not taking: Reported on 01/26/2024    [provider]  PRESCRIPTION MEDICATION Inject 1 mL into the skin every Monday, Wednesday, and Friday. mistletoe Patient not taking: Reported on 01/26/2024 12/16/19   [provider]     Critical care time: n/a       Darcella Gasman Ratasha Fabre, PA-C [facility] See Amion for pager If no response to pager , please call [phone] until 7pm After 7:00 pm call Elink  [phone]

## 2024-01-27 NOTE — ED Notes (Signed)
 Meds not given, waiting or PT to return to room

## 2024-01-28 ENCOUNTER — Inpatient Hospital Stay (HOSPITAL_COMMUNITY)

## 2024-01-28 ENCOUNTER — Encounter: Payer: Self-pay | Admitting: Hematology & Oncology

## 2024-01-28 ENCOUNTER — Encounter: Payer: Self-pay | Admitting: Family

## 2024-01-28 ENCOUNTER — Encounter (HOSPITAL_COMMUNITY): Payer: Self-pay | Admitting: Internal Medicine

## 2024-01-28 DIAGNOSIS — C7931 Secondary malignant neoplasm of brain: Secondary | ICD-10-CM

## 2024-01-28 DIAGNOSIS — Z515 Encounter for palliative care: Secondary | ICD-10-CM | POA: Diagnosis not present

## 2024-01-28 DIAGNOSIS — Z79899 Other long term (current) drug therapy: Secondary | ICD-10-CM

## 2024-01-28 DIAGNOSIS — C50912 Malignant neoplasm of unspecified site of left female breast: Secondary | ICD-10-CM | POA: Diagnosis not present

## 2024-01-28 DIAGNOSIS — J90 Pleural effusion, not elsewhere classified: Secondary | ICD-10-CM | POA: Diagnosis not present

## 2024-01-28 DIAGNOSIS — R180 Malignant ascites: Secondary | ICD-10-CM

## 2024-01-28 DIAGNOSIS — R188 Other ascites: Secondary | ICD-10-CM

## 2024-01-28 DIAGNOSIS — R4589 Other symptoms and signs involving emotional state: Secondary | ICD-10-CM

## 2024-01-28 DIAGNOSIS — C787 Secondary malignant neoplasm of liver and intrahepatic bile duct: Secondary | ICD-10-CM

## 2024-01-28 DIAGNOSIS — J9601 Acute respiratory failure with hypoxia: Secondary | ICD-10-CM | POA: Diagnosis not present

## 2024-01-28 DIAGNOSIS — J9621 Acute and chronic respiratory failure with hypoxia: Secondary | ICD-10-CM

## 2024-01-28 DIAGNOSIS — L8990 Pressure ulcer of unspecified site, unspecified stage: Secondary | ICD-10-CM

## 2024-01-28 DIAGNOSIS — Z938 Other artificial opening status: Secondary | ICD-10-CM | POA: Diagnosis not present

## 2024-01-28 DIAGNOSIS — J910 Malignant pleural effusion: Secondary | ICD-10-CM

## 2024-01-28 DIAGNOSIS — C799 Secondary malignant neoplasm of unspecified site: Secondary | ICD-10-CM | POA: Diagnosis not present

## 2024-01-28 DIAGNOSIS — C50919 Malignant neoplasm of unspecified site of unspecified female breast: Secondary | ICD-10-CM | POA: Diagnosis not present

## 2024-01-28 DIAGNOSIS — Z7189 Other specified counseling: Secondary | ICD-10-CM

## 2024-01-28 DIAGNOSIS — R52 Pain, unspecified: Secondary | ICD-10-CM

## 2024-01-28 LAB — CBC
HCT: 23.2 % — ABNORMAL LOW (ref 36.0–46.0)
Hemoglobin: 7.3 g/dL — ABNORMAL LOW (ref 12.0–15.0)
MCH: 25 pg — ABNORMAL LOW (ref 26.0–34.0)
MCHC: 31.5 g/dL (ref 30.0–36.0)
MCV: 79.5 fL — ABNORMAL LOW (ref 80.0–100.0)
Platelets: 770 10*3/uL — ABNORMAL HIGH (ref 150–400)
RBC: 2.92 MIL/uL — ABNORMAL LOW (ref 3.87–5.11)
RDW: 17.2 % — ABNORMAL HIGH (ref 11.5–15.5)
WBC: 4.7 10*3/uL (ref 4.0–10.5)
nRBC: 0 % (ref 0.0–0.2)

## 2024-01-28 LAB — COMPREHENSIVE METABOLIC PANEL
ALT: 67 U/L — ABNORMAL HIGH (ref 0–44)
AST: 135 U/L — ABNORMAL HIGH (ref 15–41)
Albumin: 2 g/dL — ABNORMAL LOW (ref 3.5–5.0)
Alkaline Phosphatase: 507 U/L — ABNORMAL HIGH (ref 38–126)
Anion gap: 7 (ref 5–15)
BUN: 20 mg/dL (ref 6–20)
CO2: 25 mmol/L (ref 22–32)
Calcium: 7.9 mg/dL — ABNORMAL LOW (ref 8.9–10.3)
Chloride: 103 mmol/L (ref 98–111)
Creatinine, Ser: 0.89 mg/dL (ref 0.44–1.00)
GFR, Estimated: >60 mL/min (ref >60)
Glucose, Bld: 80 mg/dL (ref 70–99)
Potassium: 3.6 mmol/L (ref 3.5–5.1)
Sodium: 135 mmol/L (ref 135–145)
Total Bilirubin: 0.5 mg/dL (ref 0.0–1.2)
Total Protein: 4.7 g/dL — ABNORMAL LOW (ref 6.5–8.1)

## 2024-01-28 LAB — PREALBUMIN: Prealbumin: 8 mg/dL — ABNORMAL LOW (ref 18–38)

## 2024-01-28 MED ORDER — MELATONIN 5 MG PO TABS: 10.0000 mg | ORAL_TABLET | Freq: Every evening | ORAL | Status: DC | PRN

## 2024-01-28 MED ORDER — FENTANYL CITRATE PF 50 MCG/ML IJ SOSY
50.0000 ug | PREFILLED_SYRINGE | Freq: Once | INTRAMUSCULAR | Status: AC | PRN
  Administered 2024-01-28: 50 ug via INTRAVENOUS
  Filled 2024-01-28: qty 1

## 2024-01-28 MED ORDER — TRAMADOL HCL 50 MG PO TABS
50.0000 mg | ORAL_TABLET | Freq: Four times a day (QID) | ORAL | Status: DC | PRN
  Administered 2024-01-28: 50 mg via ORAL
  Filled 2024-01-28: qty 1

## 2024-01-28 MED ORDER — LIDOCAINE HCL 1 % IJ SOLN
INTRAMUSCULAR | Status: AC
  Filled 2024-01-28: qty 20

## 2024-01-28 MED ORDER — LACTATED RINGERS IV SOLN
INTRAVENOUS | Status: DC
Start: 2024-01-28 — End: 2024-01-28

## 2024-01-28 MED ORDER — FENTANYL CITRATE PF 50 MCG/ML IJ SOSY
25.0000 ug | PREFILLED_SYRINGE | Freq: Once | INTRAMUSCULAR | Status: AC | PRN
  Administered 2024-01-28: 50 ug via INTRAVENOUS
  Filled 2024-01-28: qty 1

## 2024-01-28 MED ORDER — OXYCODONE HCL 5 MG PO TABS
2.5000 mg | ORAL_TABLET | ORAL | Status: DC | PRN
  Administered 2024-01-28 (×2): 5 mg via ORAL
  Administered 2024-01-29: 2.5 mg via ORAL
  Administered 2024-01-29 (×2): 5 mg via ORAL
  Filled 2024-01-28 (×6): qty 1

## 2024-01-28 MED ORDER — MIDAZOLAM HCL 2 MG/2ML IJ SOLN
1.0000 mg | Freq: Once | INTRAMUSCULAR | Status: DC | PRN
  Filled 2024-01-28: qty 2

## 2024-01-28 MED ORDER — MORPHINE SULFATE (PF) 2 MG/ML IV SOLN
1.0000 mg | INTRAVENOUS | Status: DC | PRN
  Administered 2024-01-29: 1 mg via INTRAVENOUS
  Filled 2024-01-28 (×2): qty 1

## 2024-01-28 MED ORDER — GADOBUTROL 1 MMOL/ML IV SOLN
6.0000 mL | Freq: Once | INTRAVENOUS | Status: AC | PRN
Start: 2024-01-28 — End: 2024-01-28
  Administered 2024-01-28: 6 mL via INTRAVENOUS

## 2024-01-28 NOTE — Progress Notes (Signed)
   01/28/24 1205  Spiritual Encounters  Type of Visit Initial  Care provided to: Pt and family  Referral source Clinical staff  Reason for visit Advance directives  Spiritual Framework  Presenting Themes Meaning/purpose/sources of inspiration;Goals in life/care   Per spiritual care consult request, I visited with Dr. Virgina Norfolk to discuss all her presnt needs and to offer an opportunity to amend her HCPOA.  Initially, While patient in MRI, I met with her mother. She debriefed with me feelings around her daughter's illness over past decade and the many places they have travelled.  Dezire and her mother both processed with me numerous aspects of coping with illness, making meaning, and family dynamics. I provided active and reflective listening while offering compassionate presence and relationship.  I will aim to provide follow up care and support as needed. Jayse Hodkinson L. Sophronia Simas, M.Div [phone]

## 2024-01-28 NOTE — Progress Notes (Signed)
 Daily Progress Note   Patient Name: Taylor Flynn       Date: 01/28/2024 DOB: November 13, 1975  Age: 48 y.o. MRN#: [id number] Attending Physician: Carollee Herter, DO Primary Care Physician: Josph Macho, MD Admit Date: 01/26/2024 Length of Stay: 1 day  Reason for Consultation/Follow-up: Establishing goals of care  Subjective:   CC: Patient noting pain after Pleurx placement today.  Following up regarding complex medical decision making.  Subjective:  Reviewed EMR prior to presenting to bedside.  Patient seen by oncologist, Dr. Myna Hidalgo, today.  Reviewed recommendations.  Initially presented to bedside to see patient though able to discuss care with PCCM provider Fannie Knee about to go in for Pleurx placement.  Informed patient would be by later in the afternoon after completion of this.  Presented to bedside later in the day to see patient.  Patient laying in bed grimacing.  Patient noted worsening of pain after bilateral Pleurx placement.  Patient's mother present at bedside and patient gave permission to continue with conversation at that time.  Reviewed symptom management at that time.  Patient received tramadol though noted this is not assisting with pain management.  Discussed multiple pain medications as patient has received these previously and knows what does not does not work for her.  Patient is opioid nave and very sensitive to opioids.  Patient agreeing with discontinuing tramadol, starting low-dose oxycodone as needed, and having low-dose IV morphine available for breakthrough pain.  Patient and mother noted that chaplain did come by today to visit.  They found this incredibly helpful in aiding in conversation together.  Chaplain did drop off ACP documentation for patient's review.  Patient continued to confirm that she would want her sister to be her healthcare power of attorney.  Had plan on reviewing documentation and planning with her further though during this discussion, became "heated" when  patient and mother had disagreement about care planning moving forward.  Patient feeling frustrated that her family is not involving her in all discussions and acting that she "does not have a mind still" when she herself wants to be included in planning regarded to her end-of-life care "whenever that may be".  Acknowledges this.  Spent time providing emotional support be active listening.  After allowing time for processing, discussed with patient and noted would return at a later time to continue discussions about advance care planning as by more important items that needed to be discussed.  Patient agreed with this plan.  All questions answered at that time.  Noted palliative medicine team continue to follow along with patient's medical journey.  After visit, patient's mother found this provider outside of room and spent time describing patient's long medical journey of enduring therapies for breast cancer for the past 9 years.  This has taken a great emotional toll on patient and her family.  Again spent time providing emotional support reactive listening.  Normalized feelings of grief and anger during difficult times.  Again noted palliative medicine team to continue to follow along to support and would asked chaplain to continue following along.  Updated IDT regarding care plan.  Objective:   Vital Signs:  BP 123/72 (BP Location: Left Arm)   Pulse 62   Temp 98.7 F (37.1 C) (Oral)   Resp 18   Ht 5\' 4"  (1.626 m)   Wt 65.9 kg   LMP 12/07/2018   SpO2 100%   BMI 24.94 kg/m   Physical Exam: General: NAD, alert, cachectic, frail, chronically ill-appearing Cardiovascular: Tachycardia noted Respiratory:  no increased work of breathing noted, not in respiratory distress Abdomen: distended Neuro: A&Ox4, following commands easily Psych: appropriately answers all questions  Imaging: I personally reviewed recent imaging.   Assessment & Plan:   Assessment: Patient is a 48 year old female  with a past medical history of metastatic breast cancer who presented to ED on 01/26/2024 for management of worsening shortness of breath with pleuritic chest pain.  Patient had recently moved from [place], was receiving care there, to [place] for more family support.  Patient has been receiving chemotherapy and plans to follow-up with Dr. Myna Hidalgo in the outpatient setting.  Upon admission, patient noted to have acute respiratory failure with hypoxia secondary to bilateral pleural infusions seen on imaging.  Imaging also revealed metastatic disease to lung and liver, ascites noted, and left hydronephrosis..  Palliative medicine team consulted to assist with complex medical decision making.   Recommendations/Plan: # Complex medical decision making/goals of care:    -Discussed care with patient while mother at bedside as detailed above in HPI.  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  Patient has elected to continue with concurrent care through the veteran which means patient can receive hospice while receiving palliative cancer therapies.  Patient's VA representative at [facility] is [place] # [phone].  [facility] has agreed to support concurrent care for patient.  Dr. Myna Hidalgo following along with patient regarding recommendations/if cancer directed therapies appropriate.                -Patient wanting to update ACP documentation.  Attempted to discuss again with patient today.  This was interrupted due to heated discussion between patient and mother.  Chaplain following to assist with completion.  Will attempt to discuss further as able.                -  Code Status: Full Code    # Symptom management Patient is receiving these palliative interventions for symptom management with an intent to improve quality of life.                 -Pain, in setting of metastatic breast cancer                               -Discontinue buprenorphine 1 film twice daily as  needed   -Start oxycodone 2.5-5 mg every 4 hours as needed   -Start IV morphine 1 mg every 3 hours as needed breakthrough pain                 -Shortness of breath                               -Seen by PCCM and received bilateral Pleurx today.  Notes shortness of breath improved.                 -Anxiety                               -Continue Ativan tab 0.5 mg twice daily as needed                               -Discussed with patient consideration of olanzapine to assist with mood, sleep, and appetite.  Patient considering.   #  Psycho-social/Spiritual Support:  - Support System: Siblings, children, mother                -Have involved chaplain to assist with completion of ACP documentation   # Discharge Planning: Home with hospice to continue concurrent care (as a veteran) as per oncology's recommendations for determining if cancer therapy is appropriate  Discussed with: Patient, patient's mother, hospitalist, oncologist, RN, [facility].  Thank you for allowing the palliative care team to participate in the care Virgina Norfolk.  Alvester Morin, DO Palliative Care Provider PMT # [phone]  If patient remains symptomatic despite maximum doses, please call PMT at [phone] between 0700 and 1900. Outside of these hours, please call attending, as PMT does not have night coverage.

## 2024-01-28 NOTE — Plan of Care (Signed)

## 2024-01-28 NOTE — Procedures (Signed)
 PleurX Insertion Procedure Note  Gissel Keilman  [id number]  [birth year]  Date:01/28/24  Time:3:25 PM   Provider Performing:Perri Lamagna Celine Mans  Procedure: PleurX Tunneled Pleural Catheter Placement (32550)  Indication(s) Relief of dyspnea from recurrent effusion  Consent Risks of the procedure as well as the alternatives and risks of each were explained to the patient and/or caregiver.  Consent for the procedure was obtained.   Anesthesia Topical only with 1% lidocaine    Time Out Verified patient identification, verified procedure, site/side was marked, verified correct patient position, special equipment/implants available, medications/allergies/relevant history reviewed, required imaging and test results available.   Sterile Technique Maximal sterile technique including sterile barrier drape, hand hygiene, sterile gown, sterile gloves, mask, hair covering.    Procedure Description Ultrasound used to identify appropriate pleural anatomy for placement and overlying skin marked.  Area of drainage cleaned and draped in sterile fashion.   Lidocaine was used to anesthetize the skin and subcutaneous tissue.   1.5 cm incision made overlying fluid and another about 5 cm anterior to this along chest wall.  PleurX catheter inserted in usual sterile fashion using modified seldinger technique.  Interrupted silk sutures placed at catheter insertion and tunneling points which will be removed at later date.  PleurX catheter then hooked to suction.  After fluid aspirated, pleurX capped and sterile dressing applied.   Complications/Tolerance None; patient tolerated the procedure well. Chest X-ray is ordered to confirm no post-procedural complication.   EBL Minimal   Specimen(s) none   Left sided PleurX placed after Dr. Vassie Loll had placed a right sided one.   Rutherford Guys, PA - C [facility] For pager details, please see AMION or use Epic chat  After 1900, please  call [facility] for cross coverage needs 01/28/2024, 3:26 PM

## 2024-01-28 NOTE — Hospital Course (Addendum)
 HPI: Taylor Flynn is a 48 y.o. female with history of metastatic breast cancer recently moved from [place] was admitted 3 weeks ago at [facility] for pleural effusion requiring chest tube placement and also was admitted again for abdominal discomfort with fluid but eventually patient decided to come to [place] to live with her family presents to the ER because of increasing shortness of breath with pleuritic chest pain since last night.  Denies any productive cough fever or chills.  Has noticed increased swelling in the right upper extremity and abdomen.  Patient is presently on trastuzumab and chemotherapy.  Patient previously used to follow with Dr. Arlan Organ oncologist and has appointment with Dr. Myna Hidalgo in the coming weeks.   ED Course: In the ER patient was requiring 3 L oxygen to maintain sats and has bilateral pleural effusion.  Given the pleuritic chest pain CT angiogram of the chest has been ordered.  Results are pending.  Labs show BNP of 47 lactic acid 0.9 hemoglobin 8 which is at baseline when compared to recent past.  LFTs elevated likely from metastasis.  Significant Events: Admitted 01/26/2024 for acute respiratory failure with hypoxia with bilateral pleural effusions   Significant Labs: Na 133, K 3.6, CO2 of 22, BUN 20, Scr 0.84, glu 60 WBC 6.1, HgB 8, plt 838  Significant Imaging Studies: CXR Diffuse airspace disease in the lungs bilaterally, possible edema or infiltrate. 2. Moderate right pleural effusion and small left pleural effusion. CTPA Negative for pulmonary embolism. 2. Widespread pulmonary and hepatic metastatic disease. Patchy bilateral pulmonary opacification could be confluent metastases or pneumonia. 3. Ascites and large bilateral pleural effusion. 4. Left hydronephrosis. Abd U/S Cirrhotic liver configuration. No discrete focal liver lesion seen on this limited exam. Mild to moderate ascites. No splenomegaly. 2. Mild fullness in the right renal  collecting system. Mild-to-moderate left hydronephrosis. 3. Partially contracted gallbladder with sludge. No sonographic evidence of acute cholecystitis.  Antibiotic Therapy: Anti-infectives (From admission, onward)    Start     Dose/Rate Route Frequency Ordered Stop   01/27/24 2200  vancomycin (VANCOREADY) IVPB 750 mg/150 mL  Status:  Discontinued        750 mg 150 mL/hr over 60 Minutes Intravenous Every 12 hours 01/27/24 0951 01/27/24 1402   01/27/24 1700  ceFEPIme (MAXIPIME) 2 g in sodium chloride 0.9 % 100 mL IVPB        2 g 200 mL/hr over 30 Minutes Intravenous Every 8 hours 01/27/24 0953     01/27/24 0800  vancomycin (VANCOREADY) IVPB 1500 mg/300 mL        1,500 mg 150 mL/hr over 120 Minutes Intravenous  Once 01/27/24 0749 01/27/24 1254   01/27/24 0715  vancomycin (VANCOCIN) IVPB 1000 mg/200 mL premix  Status:  Discontinued        1,000 mg 200 mL/hr over 60 Minutes Intravenous  Once 01/27/24 0700 01/27/24 0749   01/27/24 0715  ceFEPIme (MAXIPIME) 2 g in sodium chloride 0.9 % 100 mL IVPB        2 g 200 mL/hr over 30 Minutes Intravenous  Once 01/27/24 0700 01/27/24 1610       Procedures: Bilateral PleurX catheter placement by PCCM  Consultants: Palliative care PCCM oncology

## 2024-01-28 NOTE — Assessment & Plan Note (Addendum)
 01-28-2024 due to malignant pleural effusions. Will order home O2.  01-29-2024 pt weaned to RA while at rest but feels she need supplemental O2 when she is moving around.  01-30-2024 home O2 arranged

## 2024-01-28 NOTE — Assessment & Plan Note (Addendum)
 01-28-2024 pt will need drainage supplies for home use. Prn IV morphine.  01-29-2024 DC to home with prn 5 mg oxycodone. Pt advised to take laxative if using oxycodone on a regular basis. Advised her not to use [place] if she is going to use oxycodone to avoid precipitated withdrawal.

## 2024-01-28 NOTE — Assessment & Plan Note (Signed)
 01-28-2024 presumed to be malignant.

## 2024-01-28 NOTE — Assessment & Plan Note (Signed)
 01-28-2024 due to bilateral pleural effusions.  01-29-2024 improved after drainage of her bilateral effusions.

## 2024-01-28 NOTE — Progress Notes (Addendum)
 PROGRESS NOTE    Taylor Flynn  VFI:[id number] DOB: [birth year] DOA: 01/26/2024 PCP: Josph Macho, MD  Subjective: Pt seen and examined. Met with pt's family at bedside. PCCM placed bilateral pleurX catheters today. Pt ready to go home tomorrow. Bedside abd U/S shows hepatomegaly at the level of her umbilicus. Minimal abd ascites.   Hospital Course: HPI: Taylor Flynn is a 48 y.o. female with history of metastatic breast cancer recently moved from [place] was admitted 3 weeks ago at [facility] for pleural effusion requiring chest tube placement and also was admitted again for abdominal discomfort with fluid but eventually patient decided to come to [place] to live with her family presents to the ER because of increasing shortness of breath with pleuritic chest pain since last night.  Denies any productive cough fever or chills.  Has noticed increased swelling in the right upper extremity and abdomen.  Patient is presently on trastuzumab and chemotherapy.  Patient previously used to follow with Dr. Arlan Organ oncologist and has appointment with Dr. Myna Hidalgo in the coming weeks.   ED Course: In the ER patient was requiring 3 L oxygen to maintain sats and has bilateral pleural effusion.  Given the pleuritic chest pain CT angiogram of the chest has been ordered.  Results are pending.  Labs show BNP of 47 lactic acid 0.9 hemoglobin 8 which is at baseline when compared to recent past.  LFTs elevated likely from metastasis.  Significant Events: Admitted 01/26/2024 for acute respiratory failure with hypoxia with bilateral pleural effusions   Significant Labs: Na 133, K 3.6, CO2 of 22, BUN 20, Scr 0.84, glu 60 WBC 6.1, HgB 8, plt 838  Significant Imaging Studies: CXR Diffuse airspace disease in the lungs bilaterally, possible edema or infiltrate. 2. Moderate right pleural effusion and small left pleural effusion. CTPA Negative for pulmonary embolism. 2. Widespread pulmonary and  hepatic metastatic disease. Patchy bilateral pulmonary opacification could be confluent metastases or pneumonia. 3. Ascites and large bilateral pleural effusion. 4. Left hydronephrosis. Abd U/S Cirrhotic liver configuration. No discrete focal liver lesion seen on this limited exam. Mild to moderate ascites. No splenomegaly. 2. Mild fullness in the right renal collecting system. Mild-to-moderate left hydronephrosis. 3. Partially contracted gallbladder with sludge. No sonographic evidence of acute cholecystitis.  Antibiotic Therapy: Anti-infectives (From admission, onward)    Start     Dose/Rate Route Frequency Ordered Stop   01/27/24 2200  vancomycin (VANCOREADY) IVPB 750 mg/150 mL  Status:  Discontinued        750 mg 150 mL/hr over 60 Minutes Intravenous Every 12 hours 01/27/24 0951 01/27/24 1402   01/27/24 1700  ceFEPIme (MAXIPIME) 2 g in sodium chloride 0.9 % 100 mL IVPB        2 g 200 mL/hr over 30 Minutes Intravenous Every 8 hours 01/27/24 0953     01/27/24 0800  vancomycin (VANCOREADY) IVPB 1500 mg/300 mL        1,500 mg 150 mL/hr over 120 Minutes Intravenous  Once 01/27/24 0749 01/27/24 1254   01/27/24 0715  vancomycin (VANCOCIN) IVPB 1000 mg/200 mL premix  Status:  Discontinued        1,000 mg 200 mL/hr over 60 Minutes Intravenous  Once 01/27/24 0700 01/27/24 0749   01/27/24 0715  ceFEPIme (MAXIPIME) 2 g in sodium chloride 0.9 % 100 mL IVPB        2 g 200 mL/hr over 30 Minutes Intravenous  Once 01/27/24 0700 01/27/24 4166  Procedures: Bilateral PleurX catheter placement by PCCM  Consultants: Palliative care PCCM oncology    Assessment and Plan: * Acute hypoxic respiratory failure (HCC) 01-28-2024 due to malignant pleural effusions. Will order home O2.  Abdominal ascites 01-28-2024 bedside abd U/S shows only minimal abd ascites. She has profound hepatomegaly with her liver edge at the level of her umbilicus. Liver appearance on U/S consistent with liver mets. Amount  of ascites is not very large. Risks of paracentesis including bowel perforation, etc, are GREATER than any potential benefits of paracentesis. Pt agrees. No indication for paracentesis at this time.  Pressure injury of skin Present on admission.  Pressure Injury 01/27/24 Buttocks Right;Upper Stage 2 -  Partial thickness loss of dermis presenting as a shallow open injury with a red, pink wound bed without slough. (Active)  01/27/24   Location: Buttocks  Location Orientation: Right;Upper  Staging: Stage 2 -  Partial thickness loss of dermis presenting as a shallow open injury with a red, pink wound bed without slough.  Wound Description (Comments):   Present on Admission: Yes        S/P chest tube placement [facility]) - bilateral PleurX catheter placed 01-28-2024 by PCCM. 01-28-2024 pt will need drainage supplies for home use. Prn IV morphine.  Metastasis from breast cancer (HCC) 01-28-2024 multiple mets. Followed by oncology.  Bilateral pleural effusion 01-28-2024 presumed to be malignant.  Shortness of breath 01-28-2024 due to bilateral pleural effusions.  Breast cancer metastasized to liver, unspecified laterality (HCC) 01-28-2024 pt gets insurance through [place]. [facility] and [facility]. CM attempting to find hospice agency that will accept pt as a client with concurrent care(hospice + palliative chemo).  Elevated LFTs 01-28-2024 likely due to liver mets  Edema of right upper extremity 01-28-2024 no DVT on UE U/S.  Iron deficiency anemia 01-28-2024 may need outpatient IV iron. No role for PRBC transfusion at this time. Pt is not hypotensive.  DVT prophylaxis: heparin injection 5,000 Units Start: 01/28/24 2200    Code Status: Full Code Family Communication: discussed with pt and her family at bedside Disposition Plan: return home Reason for continuing need for hospitalization: monitor labs. Plan for DC in AM.  Objective: Vitals:   01/28/24  1445 01/28/24 1450 01/28/24 1455 01/28/24 1500  BP: 110/72 122/76 122/87 (!) 122/93  Pulse: 100     Resp:  20 20 19   Temp:      TempSrc:      SpO2:  100% 100% 100%  Weight:      Height:        Intake/Output Summary (Last 24 hours) at 01/28/2024 1728 Last data filed at 01/28/2024 1521 Gross per 24 hour  Intake 370.2 ml  Output 1350 ml  Net -979.8 ml   Filed Weights   01/27/24 0706  Weight: 65.9 kg    Examination:  Physical Exam Vitals and nursing note reviewed.  Constitutional:      Comments: Chronically ill appearing  HENT:     Head: Normocephalic and atraumatic.  Pulmonary:     Effort: Pulmonary effort is normal.  Abdominal:     General: There is distension.     Comments: Abd U/S shows minimal abd ascites. Enlarged liver to level on umbilicus seen on abd U/S. Liver appearance of metastatic disease.  Musculoskeletal:     Comments: Right UE edema  Skin:    General: Skin is warm and dry.     Capillary Refill: Capillary refill takes less than 2  seconds.  Neurological:     Mental Status: She is alert and oriented to person, place, and time.     Data Reviewed: I have personally reviewed following labs and imaging studies  CBC: Recent Labs  Lab 01/26/24 2239 01/26/24 2300 01/27/24 0654 01/28/24 0500  WBC 6.1  --  4.9 4.7  NEUTROABS 4.3  --  3.2  --   HGB 8.0* 8.8* 7.2* 7.3*  HCT 26.7* 26.0* 24.0* 23.2*  MCV 80.4  --  81.1 79.5*  PLT 838*  --  763* 770*   Basic Metabolic Panel: Recent Labs  Lab 01/26/24 2239 01/26/24 2300 01/27/24 0654 01/28/24 0500  NA 133* 133* 133* 135  K 3.6 3.6 3.7 3.6  CL 100 100 100 103  CO2 22  --  24 25  GLUCOSE 60* 57* 62* 80  BUN 20 17 17 20   CREATININE 0.84 0.90 0.71 0.89  CALCIUM 8.1*  --  7.8* 7.9*   GFR: Estimated Creatinine Clearance: 72.2 mL/min (by C-G formula based on SCr of 0.89 mg/dL). Liver Function Tests: Recent Labs  Lab 01/26/24 2239 01/27/24 0654 01/28/24 0500  AST 152* 140* 135*  ALT 72* 66* 67*   ALKPHOS 575* 545* 507*  BILITOT 0.9 0.7 0.5  PROT 4.9* 4.6* 4.7*  ALBUMIN 2.0* 2.0* 2.0*   Coagulation Profile: Recent Labs  Lab 01/26/24 2239  INR 1.0   BNP (last 3 results) Recent Labs    01/26/24 2239  BNP 47.0   Anemia Panel: Recent Labs    01/27/24 0654  VITAMINB12 1,756*  FOLATE 16.1  FERRITIN 623*  TIBC 245*  IRON 15*  RETICCTPCT 4.1*   Sepsis Labs: Recent Labs  Lab 01/26/24 2255 01/27/24 0024  LATICACIDVEN 0.9 0.7    Recent Results (from the past 240 hours)  Resp panel by RT-PCR (RSV, Flu A&B, Covid) Anterior Nasal Swab     Status: None   Collection Time: 01/26/24 10:39 PM   Specimen: Anterior Nasal Swab  Result Value Ref Range Status   SARS Coronavirus 2 by RT PCR NEGATIVE NEGATIVE Final    Comment: (NOTE) SARS-CoV-2 target nucleic acids are NOT DETECTED.  The SARS-CoV-2 RNA is generally detectable in upper respiratory specimens during the acute phase of infection. The lowest concentration of SARS-CoV-2 viral copies this assay can detect is 138 copies/mL. A negative result does not preclude SARS-Cov-2 infection and should not be used as the sole basis for treatment or other patient management decisions. A negative result may occur with  improper specimen collection/handling, submission of specimen other than nasopharyngeal swab, presence of viral mutation(s) within the areas targeted by this assay, and inadequate number of viral copies(<138 copies/mL). A negative result must be combined with clinical observations, patient history, and epidemiological information. The expected result is Negative.  Fact Sheet for Patients:  BloggerCourse.com  Fact Sheet for Healthcare Providers:  SeriousBroker.it  This test is no t yet approved or cleared by the [place] FDA and  has been authorized for detection and/or diagnosis of SARS-CoV-2 by FDA under an Emergency Use Authorization (EUA). This EUA  will remain  in effect (meaning this test can be used) for the duration of the COVID-19 declaration under Section 564(b)(1) of the Act, 21 U.S.C.section 360bbb-3(b)(1), unless the authorization is terminated  or revoked sooner.       Influenza A by PCR NEGATIVE NEGATIVE Final   Influenza B by PCR NEGATIVE NEGATIVE Final    Comment: (NOTE) The Xpert Xpress SARS-CoV-2/FLU/RSV plus assay is intended  as an aid in the diagnosis of influenza from Nasopharyngeal swab specimens and should not be used as a sole basis for treatment. Nasal washings and aspirates are unacceptable for Xpert Xpress SARS-CoV-2/FLU/RSV testing.  Fact Sheet for Patients: BloggerCourse.com  Fact Sheet for Healthcare Providers: SeriousBroker.it  This test is not yet approved or cleared by the [place] FDA and has been authorized for detection and/or diagnosis of SARS-CoV-2 by FDA under an Emergency Use Authorization (EUA). This EUA will remain in effect (meaning this test can be used) for the duration of the COVID-19 declaration under Section 564(b)(1) of the Act, 21 U.S.C. section 360bbb-3(b)(1), unless the authorization is terminated or revoked.     Resp Syncytial Virus by PCR NEGATIVE NEGATIVE Final    Comment: (NOTE) Fact Sheet for Patients: BloggerCourse.com  Fact Sheet for Healthcare Providers: SeriousBroker.it  This test is not yet approved or cleared by the [place] FDA and has been authorized for detection and/or diagnosis of SARS-CoV-2 by FDA under an Emergency Use Authorization (EUA). This EUA will remain in effect (meaning this test can be used) for the duration of the COVID-19 declaration under Section 564(b)(1) of the Act, 21 U.S.C. section 360bbb-3(b)(1), unless the authorization is terminated or revoked.  Performed at [facility], [address], [place], [place] [address]   Blood culture (routine x 2)     Status: None (Preliminary result)   Collection Time: 01/26/24 10:39 PM   Specimen: BLOOD  Result Value Ref Range Status   Specimen Description   Final    BLOOD PORTA CATH Performed at [facility], [address] [place], [place] [address]    Special Requests   Final    BOTTLES DRAWN AEROBIC AND ANAEROBIC Blood Culture results may not be optimal due to an inadequate volume of blood received in culture bottles Performed at [facility], [address], [place], [place] [address]    Culture   Final    NO GROWTH 2 DAYS Performed at [facility], [address], [place], [place] [address]    Report Status PENDING  Incomplete  MRSA Next Gen by PCR, Nasal     Status: None   Collection Time: 01/27/24  9:27 AM   Specimen: Nasal Mucosa; Nasal Swab  Result Value Ref Range Status   MRSA by PCR Next Gen NOT DETECTED NOT DETECTED Final    Comment: (NOTE) The GeneXpert MRSA Assay (FDA approved for NASAL specimens only), is one component of a comprehensive MRSA colonization surveillance program. It is not intended to diagnose MRSA infection nor to guide or monitor treatment for MRSA infections. Test performance is not FDA approved in patients less than 70 years old. Performed at [facility], [address], [place], [place] [address]      Radiology Studies: MR BRAIN W WO CONTRAST Result Date: 01/28/2024 CLINICAL DATA:  Breast cancer, invasive, stage IV. EXAM: MRI HEAD WITHOUT AND WITH CONTRAST TECHNIQUE: Multiplanar, multiecho pulse sequences of the brain and surrounding structures were obtained without and with intravenous contrast. CONTRAST:  6mL GADAVIST GADOBUTROL 1 MMOL/ML IV SOLN COMPARISON:  11/20/2021.  05/18/2021. FINDINGS: Brain: No evidence residual or new metastatic disease. The only imaging residua of the previously treated disease are small foci of  encephalomalacia at the posterior right cerebellum and the lateral left cerebellum. No evidence of residual enhancing tissue. No evidence of stroke, hemorrhage, hydrocephalus or extra-axial collection. No other abnormal brain or leptomeningeal enhancement occurs.  Vascular: Major vessels at the base of the brain show flow. Skull and upper cervical spine: Negative Sinuses/Orbits: Clear/normal Other: None IMPRESSION: No evidence of residual or new metastatic disease. Small foci of encephalomalacia at the posterior right cerebellum and the lateral left cerebellum, sequela of previously treated lesions. Electronically Signed   By: Paulina Fusi M.D.   On: 01/28/2024 10:54   VAS [place] UPPER EXTREMITY VENOUS DUPLEX Result Date: 01/27/2024 UPPER VENOUS STUDY  Patient Name:  YAMARIS CUMMINGS  Date of Exam:   01/27/2024 Medical Rec #: [id number]     Accession #:    [id number] Date of Birth: 21-Nov-1975      Patient Gender: F Patient Age:   61 years Exam Location:  [facility] Procedure:      VAS [place] UPPER EXTREMITY VENOUS DUPLEX Referring Phys: Midge Minium --------------------------------------------------------------------------------  Indications: Edema Risk Factors: Cancer. Limitations: Line and Right subclavian port. Comparison Study: No prior studies. Performing Technologist: Chanda Busing RVT  Examination Guidelines: A complete evaluation includes B-mode imaging, spectral Doppler, color Doppler, and power Doppler as needed of all accessible portions of each vessel. Bilateral testing is considered an integral part of a complete examination. Limited examinations for reoccurring indications may be performed as noted.  Right Findings: +----------+------------+---------+-----------+----------+--------------+ RIGHT     CompressiblePhasicitySpontaneousProperties   Summary     +----------+------------+---------+-----------+----------+--------------+ IJV           Full       Yes       Yes                              +----------+------------+---------+-----------+----------+--------------+ Subclavian                                          Not visualized +----------+------------+---------+-----------+----------+--------------+ Axillary      Full       Yes       Yes                             +----------+------------+---------+-----------+----------+--------------+ Brachial      Full                                                 +----------+------------+---------+-----------+----------+--------------+ Radial        Full                                                 +----------+------------+---------+-----------+----------+--------------+ Ulnar         Full                                                 +----------+------------+---------+-----------+----------+--------------+ Cephalic      Full                                                 +----------+------------+---------+-----------+----------+--------------+  Basilic       Full                                                 +----------+------------+---------+-----------+----------+--------------+  Left Findings: +----------+------------+---------+-----------+----------+-------+ LEFT      CompressiblePhasicitySpontaneousPropertiesSummary +----------+------------+---------+-----------+----------+-------+ Subclavian    Full       Yes       Yes                      +----------+------------+---------+-----------+----------+-------+  Summary:  Right: No evidence of deep vein thrombosis in the upper extremity. No evidence of superficial vein thrombosis in the upper extremity.  Left: No evidence of thrombosis in the subclavian.  *See table(s) above for measurements and observations.  Diagnosing physician: Carolynn Sayers Electronically signed by Carolynn Sayers on 01/27/2024 at 12:19:24 PM.    Final    US Abdomen Complete Result Date: 01/27/2024 CLINICAL DATA:  [id number] Abdominal pain [id number] EXAM: ABDOMEN  ULTRASOUND COMPLETE COMPARISON:  None Available. FINDINGS: Gallbladder: The gallbladder is only partially visualized and appears partially contracted. Disease sludge within the gallbladder. Mild wall thickening in the visualized portion. The technologist noted a negative Murphy sign. Common bile duct: Diameter: Up to 3 mm. Intrahepatic bile duct dilation. Liver: There is diffuse liver surface nodularity and heterogeneous echotexture/echogenicity, compatible with cirrhotic configuration. No discrete focal mass seen on this limited exam. Portal vein is patent on color Doppler imaging with normal direction of blood flow towards the liver. IVC: No abnormality in the visualized portion. Pancreas: No abnormality in the visualized portion. Spleen: Size and appearance within normal limits. Right Kidney: Length: 8.5 cm. Echogenicity within normal limits. There is mild fullness in the collecting system. No mass visualized. Left Kidney: Length: 9.6 cm. Echogenicity within normal limits. There is mild moderate hydronephrosis. No mass visualized. Abdominal aorta: No aneurysm visualized. Other findings: Note is made of mild to moderate ascites. IMPRESSION: 1. Cirrhotic liver configuration. No discrete focal liver lesion seen on this limited exam. Mild to moderate ascites. No splenomegaly. 2. Mild fullness in the right renal collecting system. Mild-to-moderate left hydronephrosis. 3. Partially contracted gallbladder with sludge. No sonographic evidence of acute cholecystitis. Electronically Signed   By: Jules Schick M.D.   On: 01/27/2024 10:32   CT Angio Chest Pulmonary Embolism (PE) W or WO Contrast Result Date: 01/27/2024 CLINICAL DATA:  Pulmonary embolism suspected, high probability. Recent thoracentesis. History of metastatic breast cancer. EXAM: CT ANGIOGRAPHY CHEST WITH CONTRAST TECHNIQUE: Multidetector CT imaging of the chest was performed using the standard protocol during bolus administration of intravenous contrast.  Multiplanar CT image reconstructions and MIPs were obtained to evaluate the vascular anatomy. RADIATION DOSE REDUCTION: This exam was performed according to the departmental dose-optimization program which includes automated exposure control, adjustment of the mA and/or kV according to patient size and/or use of iterative reconstruction technique. CONTRAST:  75mL OMNIPAQUE IOHEXOL 350 MG/ML SOLN COMPARISON:  03/21/2022 PET-CT. FINDINGS: Cardiovascular: Satisfactory opacification of the pulmonary arteries to the segmental level. No evidence of pulmonary embolism. Normal heart size. No pericardial effusion. Unremarkable positioning of right-sided porta catheter. Mediastinum/Nodes: Negative for mediastinal mass. Bilateral breast skin thickening and subcutaneous reticulation. Indeterminate reticulation at the bilateral axilla without discrete enlarged lymph node. Lungs/Pleura: Irregular nodules throughout the lungs which are numerous. Areas of more broad opacification in the lower lungs which could be confluent metastases  or areas of pneumonia. Large bilateral pleural effusion. It is difficult to detect discrete pleural nodularity or thickening on this arterial study. Upper Abdomen: Widespread hepatic masses compatible with metastatic disease. The partially covered left kidney is hydronephrotic. Abdominal ascites which is moderate. Musculoskeletal: No acute finding Review of the MIP images confirms the above findings. IMPRESSION: 1. Negative for pulmonary embolism. 2. Widespread pulmonary and hepatic metastatic disease. Patchy bilateral pulmonary opacification could be confluent metastases or pneumonia. 3. Ascites and large bilateral pleural effusion. 4. Left hydronephrosis. Electronically Signed   By: Tiburcio Pea M.D.   On: 01/27/2024 05:55   DG Chest 2 View Result Date: 01/26/2024 CLINICAL DATA:  Shortness of breath, abdominal distention, and jaundice. Recent thoracentesis. EXAM: CHEST - 2 VIEW COMPARISON:   03/21/2022. FINDINGS: The heart size and mediastinal contours are within normal limits. A right chest port terminates over the superior vena cava. Diffuse patchy airspace disease is noted in the lungs bilaterally with a basilar predominance there is a moderate right pleural effusion and a small left pleural effusion. No pneumothorax is seen. No acute osseous abnormality. IMPRESSION: 1. Diffuse airspace disease in the lungs bilaterally, possible edema or infiltrate. 2. Moderate right pleural effusion and small left pleural effusion. Electronically Signed   By: Thornell Sartorius M.D.   On: 01/26/2024 21:39    Scheduled Meds:  heparin  5,000 Units Subcutaneous Q8H   lipase/protease/amylase  24,000 Units Oral TID AC   senna  1 tablet Oral BID   Continuous Infusions:     LOS: 1 day   Time spent: 45 minutes  Carollee Herter, DO  [facility]  01/28/2024, 5:28 PM

## 2024-01-28 NOTE — Assessment & Plan Note (Signed)
 01-28-2024 pt gets insurance through [place]. [facility] and palliative chemo. CM attempting to find hospice agency that will accept pt as a client with concurrent care(hospice + palliative chemo).  01-29-2024 DC to home today with po oxycodone prn and po ativan prn. De-access port-a-cath. Recommend pt consume at least 1-1.5 g protein per kilo of body weight.  01-30-2024 has been made DNR by oncology today. No plans for palliative chemo due to poor nutritional status and poor performance score. Pt going home with hospice today.

## 2024-01-28 NOTE — Assessment & Plan Note (Signed)
 01-28-2024 likely due to liver mets

## 2024-01-28 NOTE — Progress Notes (Signed)
 Assisted with placement of bilateral Pleur-X drains 700 serous fluid drained R drain, 650 serous fluid from L. Patient tolerated procedure well; this writer monitored patient for 15 minutes following placement of second drain. Patient tolerated procedure well; total of of fentanyl administered for pain related to the procedure. Patient remained awake and alert throughout procedure.  VSS throughout, see flowsheet documentation.

## 2024-01-28 NOTE — Assessment & Plan Note (Signed)
 01-28-2024 may need outpatient IV iron. No role for PRBC transfusion at this time. Pt is not hypotensive.  01-29-2024 f/u with oncology in office in 02-02-2024. Oncology can decide if pt needs IV iron.

## 2024-01-28 NOTE — Assessment & Plan Note (Signed)
 01-28-2024 no DVT on UE U/S.

## 2024-01-28 NOTE — Progress Notes (Signed)
 NAME:  Taylor Flynn, MRN:  [id number], DOB:  May 08, 1976, LOS: 1 ADMISSION DATE:  01/26/2024, CONSULTATION DATE:  01/28/24 REFERRING MD:  Sunnie Nielsen, CHIEF COMPLAINT:  pleural effusion    History of Present Illness:  Taylor Flynn is a 48 y.o. F with PMH of metastatic breast Ca most recently treated with cycle 1 of Enhertu on January 13, 2024 and previously treated by Dr. Myna Hidalgo before moving to [place] and receiving treatment there.  She was hospitalized there three weeks ago with bilateral pleural effusions that required bilateral chest tube placement.  She has decided to move back to [place] to be closer to family.  Over the last few days she noted worsening shortness of breath and pleuritic CP so presented to the ED.  She was found to have re-accumulation of bilateral effusions therefore PCCM was consulted.  She has a scheduled follow up appt with Dr. Myna Hidalgo.  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   Significant Hospital Events: Including procedures, antibiotic start and stop dates in addition to other pertinent events   3/25 admit to [facility] with re-accumulation of bilateral malignant effusions, IR consult for pleurx  Interim History / Subjective:  As above   Objective   Blood pressure 118/87, pulse (!) 105, temperature 98 F (36.7 C), temperature source Oral, resp. rate 18, height 5\' 4"  (1.626 m), weight 65.9 kg, last menstrual period 12/07/2018, SpO2 100%.        Intake/Output Summary (Last 24 hours) at 01/28/2024 1058 Last data filed at 01/28/2024 0500 Gross per 24 hour  Intake  340 ml  Output --  Net 340 ml   Filed Weights   01/27/24 0706  Weight: 65.9 kg   General:  thin, poorly nourished F resting in bed in NAD HEENT: MM pink/moist Neuro: alert and oriented, non-focal  CV: s1s2 rrr, no m/r/g PULM:  diminished air entry bilateral bases, shallow inspiration, no severe distress on [place] GI: soft, diffuse upper abdominal TTP, mildly distended  Extremities: warm/dry, no edema  Skin: no rashes or lesions   Resolved Hospital Problem list     Assessment & Plan:    Acute on chronic hypoxic respiratory failure secondary to bilateral malignant pleural effusions Pleurx risks and benefits were discussed with patient along with home care, pt has support at home and would like to proceed -plan for bilateral pleurx today -PCCM will follow for pleurx care -palliative care and oncology are following -rest of plan per primary team -continue supplemental O2 to maintain sats >92%    Best Practice (right click and "Reselect all SmartList Selections" daily)  Per primary team    Labs   CBC: Recent Labs  Lab 01/26/24 2239 01/26/24 2300 01/27/24 0654 01/28/24 0500  WBC 6.1  --  4.9 4.7  NEUTROABS 4.3  --  3.2  --   HGB 8.0* 8.8* 7.2* 7.3*  HCT 26.7* 26.0* 24.0* 23.2*  MCV 80.4  --  81.1 79.5*  PLT 838*  --  763* 770*    Basic Metabolic Panel: Recent Labs  Lab 01/26/24 2239 01/26/24 2300 01/27/24 0654 01/28/24 0500  NA 133* 133* 133* 135  K 3.6 3.6 3.7 3.6  CL  100 100 100 103  CO2 22  --  24 25  GLUCOSE 60* 57* 62* 80  BUN 20 17 17 20   CREATININE 0.84 0.90 0.71 0.89  CALCIUM 8.1*  --  7.8* 7.9*   GFR: Estimated Creatinine Clearance: 72.2 mL/min (by C-G formula based on SCr of 0.89 mg/dL). Recent Labs  Lab 01/26/24 2239 01/26/24 2255 01/27/24 0024 01/27/24 0654 01/28/24 0500  WBC 6.1  --   --  4.9 4.7  LATICACIDVEN  --  0.9 0.7  --   --     Liver Function Tests: Recent Labs  Lab 01/26/24 2239 01/27/24 0654 01/28/24 0500  AST 152*  140* 135*  ALT 72* 66* 67*  ALKPHOS 575* 545* 507*  BILITOT 0.9 0.7 0.5  PROT 4.9* 4.6* 4.7*  ALBUMIN 2.0* 2.0* 2.0*   No results for input(s): "LIPASE", "AMYLASE" in the last 168 hours. No results for input(s): "AMMONIA" in the last 168 hours.  ABG    Component Value Date/Time   HCO3 26.0 01/26/2024 2239   TCO2 22 01/26/2024 2300   O2SAT 66 01/26/2024 2239     Coagulation Profile: Recent Labs  Lab 01/26/24 2239  INR 1.0    Cardiac Enzymes: No results for input(s): "CKTOTAL", "CKMB", "CKMBINDEX", "TROPONINI" in the last 168 hours.  HbA1C: No results found for: "HGBA1C"  CBG: No results for input(s): "GLUCAP" in the last 168 hours.  Review of Systems:   Please see the history of present illness. 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   Surgical History:   Past Surgical History:  Procedure Laterality Date   BREAST BIOPSY Left 01/2016   CESAREAN SECTION  2012   HERNIA REPAIR     ? Inguinal, but patient unsure.  Age 18   MASS EXCISION Left 11/24/2018   Procedure: EXCISION NODULE LEFT CHEST WALL ERAS PATHWAY;  Surgeon: Claud Kelp, MD;  Location: [facility];  Service: General;  Laterality: Left;   SENTINEL NODE BIOPSY Left 2017 APRIL     Social History:   reports that she has never smoked. She has never used smokeless tobacco. She reports that she does not drink alcohol and does not use drugs.   Family History:  Her family history includes Aortic aneurysm in her mother; Asthma in her brother and sister; Breast cancer in her maternal aunt and sister; Dementia in her father; Diabetes  in her maternal grandmother; Eczema in her daughter; Hypertension in her mother; Leukemia in her maternal grandmother; Prostate cancer in her brother and maternal grandfather.   Allergies Allergies  Allergen Reactions   Aspirin Swelling    Angioedema    Sulfa Antibiotics Rash     Home Medications  Prior to Admission medications   Medication Sig Start Date End Date Taking? Authorizing Provider  Ascorbic Acid (VITAMIN C PO) Take 1 tablet by mouth daily at 6 (six) AM.   Yes [provider]  Buprenorphine HCl 75 MCG FILM Place 1 Film inside cheek 2 (two) times daily as needed (For PAin). 12/17/23  Yes [provider]  Cholecalciferol (VITAMIN D) 125 MCG (5000 UT) CAPS Take by mouth daily.   Yes [provider]  dexamethasone (DECADRON) 4 MG tablet  Take 4 mg by mouth as directed.  Take 1 tablet (4 mg total) by mouth 2 times a day with breakfast & dinner for three days starting the day after outpatient clinic chemotherapy infusion 01/14/24  Yes [provider]  ibuprofen (ADVIL) 200 MG tablet Take 200 mg by mouth every 6 (six) hours as needed for mild pain (pain score 1-3) or moderate pain (pain score 4-6).   Yes [provider]  LORazepam (ATIVAN) 0.5 MG tablet Take 0.5 mg by mouth at bedtime as needed for anxiety or sleep.   Yes [provider]  metroNIDAZOLE (METROGEL) 1 % gel Apply 1 Application topically daily. 01/13/24  Yes [provider]  NON FORMULARY 2 (two) times daily. Pancreatic enzymes 3 caps AC and 2 caps with meals.   Yes [provider]  OLANZapine (ZYPREXA) 2.5 MG tablet Take 2.5 mg by mouth daily as needed. 01/14/24  Yes [provider]  prochlorperazine (COMPAZINE) 10 MG tablet Take 10 mg by mouth every 6 (six) hours as needed for nausea or vomiting. 01/14/24  Yes [provider]  senna (SENOKOT) 8.6 MG tablet Take 1 tablet by mouth 2 (two) times daily. 01/13/24  Yes [provider]   TURMERIC CURCUMIN PO Take 1 tablet by mouth daily at 6 (six) AM.   Yes [provider]  acetaminophen (TYLENOL) 500 MG tablet Take 500 mg by mouth every 6 (six) hours as needed for mild pain (pain score 1-3). Patient not taking: Reported on 01/27/2024 01/13/24   [provider]  MELATONIN PO Take 150 mg by mouth at bedtime. Patient not taking: Reported on 01/26/2024    [provider]  Menaquinone-7 (VITAMIN K2 PO) Take 1 tablet by mouth daily at 6 (six) AM. Patient not taking: Reported on 01/26/2024    [provider]  Multiple Vitamins-Minerals (ZINC PO) Take 1 tablet by mouth daily at 6 (six) AM. Patient not taking: Reported on 01/26/2024    [provider]  NALTREXONE HCL PO Take 4.5 mg by mouth 3 (three) times a week. Patient not taking: Reported on 01/26/2024    [provider]  OVER THE COUNTER MEDICATION Vitamin A drops-2 drops daily. Patient not taking: Reported on 01/26/2024    [provider]  PRESCRIPTION MEDICATION Inject 1 mL into the skin every Monday, Wednesday, and Friday. mistletoe Patient not taking: Reported on 01/26/2024 12/16/19   [provider]     Critical care time: n/a       Darcella Gasman Mickie Kozikowski, PA-C [facility] See Amion for pager If no response to pager , please call [phone] until 7pm After 7:00 pm call Elink  [phone]

## 2024-01-28 NOTE — Assessment & Plan Note (Addendum)
 01-28-2024 bedside abd U/S shows only minimal abd ascites. She has profound hepatomegaly with her liver edge at the level of her umbilicus. Liver appearance on U/S consistent with liver mets. Amount of ascites is not very large. Risks of paracentesis including bowel perforation, etc, are GREATER than any potential benefits of paracentesis. Pt agrees. No indication for paracentesis at this time.

## 2024-01-28 NOTE — Procedures (Addendum)
 PleurX Insertion Procedure Note  Taylor Flynn  [id number]  1976/03/01  Date:01/28/24  Time:2:53 PM   Provider Performing:Indiyah Paone V. Jerah Esty  Procedure: PleurX Tunneled Pleural Catheter Placement (32550)  Indication(s) Relief of dyspnea from recurrent effusion  Consent Risks of the procedure as well as the alternatives and risks of each were explained to the patient and/or caregiver.  Consent for the procedure was obtained.   Anesthesia Topical only with 1% lidocaine  Fentanyl 50 mcg   Time Out Verified patient identification, verified procedure, site/side was marked, verified correct patient position, special equipment/implants available, medications/allergies/relevant history reviewed, required imaging and test results available.   Sterile Technique Maximal sterile technique including sterile barrier drape, hand hygiene, sterile gown, sterile gloves, mask, hair covering.    Procedure Description Ultrasound used to identify appropriate pleural anatomy for placement and overlying skin marked.  Area of drainage cleaned and draped in sterile fashion.   Lidocaine was used to anesthetize the skin and subcutaneous tissue.   1.5 cm incision made overlying fluid and another about 5 cm anterior to this along chest wall.  PleurX catheter inserted in usual sterile fashion using modified seldinger technique.  Interrupted silk sutures placed at catheter insertion and tunneling points which will be removed at later date.  PleurX catheter then hooked to suction.  After fluid aspirated, pleurX capped and sterile dressing applied. 800 cc amberyellow fluid drained   Complications/Tolerance None; patient tolerated the procedure well. Chest X-ray is ordered to confirm no post-procedural complication.   EBL Minimal   Specimen(s) none   Procedure supervised by dr Dominga Ferry V. Vassie Loll MD

## 2024-01-28 NOTE — Assessment & Plan Note (Signed)
 01-28-2024 multiple mets. Followed by oncology.  01-30-2024 has been made DNR by oncology today. No plans for palliative chemo due to poor nutritional status and poor performance score. Pt going home with hospice today.

## 2024-01-28 NOTE — Progress Notes (Signed)
 NAME:  Taylor Flynn, MRN:  [id number], DOB:  May 16, 1976, LOS: 1 ADMISSION DATE:  01/26/2024, CONSULTATION DATE:  01/28/24 REFERRING MD:  Sunnie Nielsen, CHIEF COMPLAINT:  pleural effusion    History of Present Illness:  Taylor Flynn is a 48 y.o. F with PMH of metastatic breast Ca most recently treated with cycle 1 of Enhertu on January 13, 2024 and previously treated by Dr. Myna Hidalgo before moving to [place] and receiving treatment there.  She was hospitalized there three weeks ago with bilateral pleural effusions that required bilateral chest tube placement.  She has decided to move back to [place] to be closer to family.  Over the last few days she noted worsening shortness of breath and pleuritic CP so presented to the ED.  She was found to have re-accumulation of bilateral effusions therefore PCCM was consulted.  She has a scheduled follow up appt with Dr. Myna Hidalgo.  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   Significant Hospital Events: Including procedures, antibiotic start and stop dates in addition to other pertinent events   3/25 admit to [facility] with re-accumulation of bilateral malignant effusions, IR consult for pleurx  Interim History / Subjective:  Complains of shortness of breath No chest pain Afebrile On nasal cannula Objective   Blood pressure (!) 122/93, pulse 100, temperature 98 F (36.7 C), temperature source Oral, resp. rate 19, height 5\' 4"  (1.626 m), weight 65.9 kg, last menstrual period 12/07/2018, SpO2 100%.        Intake/Output Summary (Last 24 hours) at 01/28/2024  1553 Last data filed at 01/28/2024 1521 Gross per 24 hour  Intake 370.2 ml  Output 1350 ml  Net -979.8 ml   Filed Weights   01/27/24 0706  Weight: 65.9 kg   General:  thin, poorly nourished F resting in bed in NAD HEENT: MM pink/moist Neuro: alert and oriented, non-focal  CV: s1s2 rrr, no m/r/g PULM: Decreased breath bilateral, no accessory muscle use GI: soft, diffuse upper abdominal TTP, mildly distended  Extremities: warm/dry, no edema  Skin: Tegaderm on skin metastatic lesions   Resolved Hospital Problem list     Assessment & Plan:    Acute on chronic hypoxic respiratory failure secondary to bilateral malignant pleural effusions -Bilateral Pleurx catheter placed, follow-up chest x-ray shows good position, 800 cc drained both sides -Placed social work consult for home health needs, Pleurx catheter can be drained once per week -palliative care and oncology are following -rest of plan per primary team -continue supplemental O2 to maintain sats >92% -Tramadol as needed for severe pain    Best Practice (right click and "Reselect all SmartList Selections" daily)  Per primary team    Labs   CBC: Recent Labs  Lab 01/26/24 2239 01/26/24 2300 01/27/24 0654 01/28/24 0500  WBC 6.1  --  4.9 4.7  NEUTROABS 4.3  --  3.2  --   HGB 8.0* 8.8* 7.2* 7.3*  HCT 26.7* 26.0* 24.0* 23.2*  MCV 80.4  --  81.1 79.5*  PLT 838*  --  763* 770*    Basic Metabolic Panel: Recent Labs  Lab 01/26/24 2239 01/26/24 2300 01/27/24 0654 01/28/24 0500  NA 133* 133* 133* 135  K 3.6 3.6 3.7 3.6  CL 100 100 100 103  CO2 22  --  24 25  GLUCOSE 60* 57* 62* 80  BUN 20 17 17 20   CREATININE 0.84 0.90 0.71 0.89  CALCIUM 8.1*  --  7.8* 7.9*   GFR: Estimated Creatinine Clearance: 72.2 mL/min (by C-G formula based on SCr of 0.89 mg/dL). Recent Labs  Lab 01/26/24 2239 01/26/24 2255 01/27/24 0024 01/27/24 0654 01/28/24 0500  WBC 6.1  --   --  4.9 4.7  LATICACIDVEN  --  0.9 0.7  --   --      Liver Function Tests: Recent Labs  Lab 01/26/24 2239 01/27/24 0654 01/28/24 0500  AST 152* 140* 135*  ALT 72* 66* 67*  ALKPHOS 575* 545* 507*  BILITOT 0.9 0.7 0.5  PROT 4.9* 4.6* 4.7*  ALBUMIN 2.0* 2.0* 2.0*   No results for input(s): "LIPASE", "AMYLASE" in the last 168 hours. No results for input(s): "AMMONIA" in the last 168 hours.  ABG    Component Value Date/Time   HCO3 26.0 01/26/2024 2239   TCO2 22 01/26/2024 2300   O2SAT 66 01/26/2024 2239     Coagulation Profile: Recent Labs  Lab 01/26/24 2239  INR 1.0    Cardiac Enzymes: No results for input(s): "CKTOTAL", "CKMB", "CKMBINDEX", "TROPONINI" in the last 168 hours.  HbA1C: No results found for: "HGBA1C"  CBG: No results for input(s): "GLUCAP" in the last 168 hours.   Cyril Mourning MD. Tonny Bollman. [facility] Pager : [phone]  If no response to pager , please call [phone] until 7 pm After 7:00 pm call Elink  [phone]   01/28/2024

## 2024-01-28 NOTE — Subjective & Objective (Signed)
 Pt seen and examined. Oxycodone 2.5 does not help with pain but 5 mg dose. Pt worried about feeling sleepy with oxycodone. Discussed that there is no way around the sedative effects of pain medications. Discussed that pain meds only cover up the pain but does not eliminate the source. The source of her pain is the recently placed PleurX catheter. If pt wants to remove source of pain, then PleurX catheter needs to be removed. But that was negate the whole reason it was placed which is to help manage her malignant effusion.  Pt ready for DC to home. Discussed addiction potential of oxycodone and ativan. Pt is aware of the risks.

## 2024-01-28 NOTE — Assessment & Plan Note (Signed)
 Present on admission.  Pressure Injury 01/27/24 Buttocks Right;Upper Stage 2 -  Partial thickness loss of dermis presenting as a shallow open injury with a red, pink wound bed without slough. (Active)  01/27/24   Location: Buttocks  Location Orientation: Right;Upper  Staging: Stage 2 -  Partial thickness loss of dermis presenting as a shallow open injury with a red, pink wound bed without slough.  Wound Description (Comments):   Present on Admission: Yes

## 2024-01-29 ENCOUNTER — Encounter: Payer: Self-pay | Admitting: Hematology & Oncology

## 2024-01-29 ENCOUNTER — Other Ambulatory Visit (HOSPITAL_COMMUNITY): Payer: Self-pay

## 2024-01-29 ENCOUNTER — Encounter: Payer: Self-pay | Admitting: Family

## 2024-01-29 ENCOUNTER — Telehealth: Payer: Self-pay | Admitting: Pulmonary Disease

## 2024-01-29 ENCOUNTER — Encounter: Payer: Self-pay | Admitting: *Deleted

## 2024-01-29 DIAGNOSIS — J90 Pleural effusion, not elsewhere classified: Secondary | ICD-10-CM | POA: Diagnosis not present

## 2024-01-29 DIAGNOSIS — C787 Secondary malignant neoplasm of liver and intrahepatic bile duct: Secondary | ICD-10-CM | POA: Diagnosis not present

## 2024-01-29 DIAGNOSIS — E8809 Other disorders of plasma-protein metabolism, not elsewhere classified: Secondary | ICD-10-CM

## 2024-01-29 DIAGNOSIS — J9601 Acute respiratory failure with hypoxia: Secondary | ICD-10-CM | POA: Diagnosis not present

## 2024-01-29 DIAGNOSIS — R52 Pain, unspecified: Secondary | ICD-10-CM

## 2024-01-29 DIAGNOSIS — Z7189 Other specified counseling: Secondary | ICD-10-CM

## 2024-01-29 DIAGNOSIS — C7931 Secondary malignant neoplasm of brain: Secondary | ICD-10-CM | POA: Diagnosis not present

## 2024-01-29 DIAGNOSIS — R0602 Shortness of breath: Secondary | ICD-10-CM

## 2024-01-29 DIAGNOSIS — C50919 Malignant neoplasm of unspecified site of unspecified female breast: Secondary | ICD-10-CM | POA: Diagnosis not present

## 2024-01-29 DIAGNOSIS — R180 Malignant ascites: Secondary | ICD-10-CM | POA: Diagnosis not present

## 2024-01-29 DIAGNOSIS — C50912 Malignant neoplasm of unspecified site of left female breast: Secondary | ICD-10-CM | POA: Diagnosis not present

## 2024-01-29 LAB — CBC WITH DIFFERENTIAL/PLATELET
Abs Immature Granulocytes: 0.01 10*3/uL (ref 0.00–0.07)
Basophils Absolute: 0.1 10*3/uL (ref 0.0–0.1)
Basophils Relative: 2 %
Eosinophils Absolute: 0.3 10*3/uL (ref 0.0–0.5)
Eosinophils Relative: 8 %
HCT: 24.4 % — ABNORMAL LOW (ref 36.0–46.0)
Hemoglobin: 7 g/dL — ABNORMAL LOW (ref 12.0–15.0)
Immature Granulocytes: 0 %
Lymphocytes Relative: 19 %
Lymphs Abs: 0.7 10*3/uL (ref 0.7–4.0)
MCH: 24.1 pg — ABNORMAL LOW (ref 26.0–34.0)
MCHC: 28.7 g/dL — ABNORMAL LOW (ref 30.0–36.0)
MCV: 84.1 fL (ref 80.0–100.0)
Monocytes Absolute: 0.4 10*3/uL (ref 0.1–1.0)
Monocytes Relative: 11 %
Neutro Abs: 2.2 10*3/uL (ref 1.7–7.7)
Neutrophils Relative %: 60 %
Platelets: 833 10*3/uL — ABNORMAL HIGH (ref 150–400)
RBC: 2.9 MIL/uL — ABNORMAL LOW (ref 3.87–5.11)
RDW: 17.5 % — ABNORMAL HIGH (ref 11.5–15.5)
WBC: 3.7 10*3/uL — ABNORMAL LOW (ref 4.0–10.5)
nRBC: 0 % (ref 0.0–0.2)

## 2024-01-29 LAB — COMPREHENSIVE METABOLIC PANEL WITH GFR
ALT: 62 U/L — ABNORMAL HIGH (ref 0–44)
AST: 126 U/L — ABNORMAL HIGH (ref 15–41)
Albumin: 1.8 g/dL — ABNORMAL LOW (ref 3.5–5.0)
Alkaline Phosphatase: 463 U/L — ABNORMAL HIGH (ref 38–126)
Anion gap: 7 (ref 5–15)
BUN: 23 mg/dL — ABNORMAL HIGH (ref 6–20)
CO2: 24 mmol/L (ref 22–32)
Calcium: 8 mg/dL — ABNORMAL LOW (ref 8.9–10.3)
Chloride: 103 mmol/L (ref 98–111)
Creatinine, Ser: 0.79 mg/dL (ref 0.44–1.00)
GFR, Estimated: >60 mL/min (ref >60)
Glucose, Bld: 59 mg/dL — ABNORMAL LOW (ref 70–99)
Potassium: 3.8 mmol/L (ref 3.5–5.1)
Sodium: 134 mmol/L — ABNORMAL LOW (ref 135–145)
Total Bilirubin: 0.5 mg/dL (ref 0.0–1.2)
Total Protein: 4.5 g/dL — ABNORMAL LOW (ref 6.5–8.1)

## 2024-01-29 LAB — CANCER ANTIGEN 27.29: CA 27.29: 145.1 U/mL — ABNORMAL HIGH (ref 0.0–38.6)

## 2024-01-29 MED ORDER — OXYCODONE HCL 5 MG PO TABS
5.0000 mg | ORAL_TABLET | Freq: Once | ORAL | Status: AC
  Administered 2024-01-29: 5 mg via ORAL
  Filled 2024-01-29: qty 1

## 2024-01-29 MED ORDER — ONDANSETRON 4 MG PO TBDP: 8.0000 mg | ORAL_TABLET | Freq: Three times a day (TID) | ORAL | Status: DC | PRN

## 2024-01-29 MED ORDER — OXYCODONE HCL 5 MG PO TABS
5.0000 mg | ORAL_TABLET | ORAL | 0 refills | Status: AC | PRN
  Filled 2024-01-29: qty 30, 5d supply, fill #0

## 2024-01-29 MED ORDER — HEPARIN SOD (PORK) LOCK FLUSH 100 UNIT/ML IV SOLN
500.0000 [IU] | Freq: Once | INTRAVENOUS | Status: AC
Start: 2024-01-29 — End: 2024-01-29
  Administered 2024-01-29: 500 [IU] via INTRAVENOUS
  Filled 2024-01-29: qty 5

## 2024-01-29 MED ORDER — PROCHLORPERAZINE MALEATE 10 MG PO TABS
10.0000 mg | ORAL_TABLET | Freq: Four times a day (QID) | ORAL | Status: DC | PRN
  Administered 2024-01-29: 10 mg via ORAL
  Filled 2024-01-29: qty 1

## 2024-01-29 MED ORDER — LORAZEPAM 0.5 MG PO TABS
0.5000 mg | ORAL_TABLET | Freq: Every evening | ORAL | 0 refills | Status: AC | PRN
  Filled 2024-01-29: qty 30, 30d supply, fill #0

## 2024-01-29 NOTE — Progress Notes (Signed)
 Overall, Taylor Flynn is having quite a bit of discomfort.  This is probably from her having the Pleurx catheters placed yesterday.  She says the Pleurx catheter on the right side is quite painful.  She is not sure should be able to go home today.  She just is worried about the pain.  I am unsure when she is really eating.  She has had no nausea or vomiting.  I do not think she really has had much in the way of bowel movements.  I think that the issue with expected further treatment for her is the fact that her previous albumin is only 8.  This is truly an ominous factor for our patients who have metastatic cancer.  Her labs show a sodium 134.  Potassium 3.8.  BUN 23 creatinine 0.79.  Calcium is 8 with an albumin of 1.8.  Her SGPT is 62 SGOT 126.  Her white cell count is 3.7.  Hemoglobin is 7.  Platelet count 833.  She still does not want to have a blood transfusion.  She still does not wish to have any IV iron.  Again, this is her personal decision and I have to respect this.  I know that she is 1 of these rare patients that can be on hospice and get treated for her breast cancer.  Again the problem that we have is her prealbumin is still low.  I have not yet seen the patient with a prealbumin this low do well with treatment.  I know this is going be incredibly difficult for her.  I just cannot think of any way that we could treat her given her overall performance status (ECOG 3.  Her vital signs show temperature of 97.3.  Pulse 91.  Blood pressure 121/82.  Her lungs sound relatively clear bilaterally.  She may have some decrease at the bases.  Cardiac exam tachycardic but regular.  Abdomen is slightly distended.  Bowel sounds are decreased.  There is some hepatomegaly just below the right costal margin.  Extremities shows muscle atrophy in upper lower extremities.  Neurological exam is nonfocal.   I will have to talk to her tomorrow about treatment.  Again, we we will try to readdress her  end-of-life issues.  I know that she is doing her best.  However, I does feel that given the overall picture, we really need to focus on her comfort.  Christin Bach, MD  Molli Hazard 5:16

## 2024-01-29 NOTE — Progress Notes (Signed)
 NAME:  Taylor Flynn, MRN:  [id number], DOB:  April 28, 1976, LOS: 2 ADMISSION DATE:  01/26/2024, CONSULTATION DATE:  01/29/24 REFERRING MD:  [facility], CHIEF COMPLAINT:  pleural effusion    History of Present Illness:  Taylor Flynn is a 48 y.o. F with PMH of metastatic breast Ca most recently treated with cycle 1 of Enhertu on January 13, 2024 and previously treated by [facility] before moving to [facility] and receiving treatment there.  She was hospitalized there three weeks ago with bilateral pleural effusions that required bilateral chest tube placement.  She has decided to move back to [place] to be closer to family.  Over the last few days she noted worsening shortness of breath and pleuritic CP so presented to the ED.  She was found to have re-accumulation of bilateral effusions therefore PCCM was consulted.  She has a scheduled follow up appt with [facility].  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   Significant Hospital Events: Including procedures, antibiotic start and stop dates in addition to other pertinent events   3/25 admit to [facility] with re-accumulation of bilateral malignant effusions, 3/26 bilateral Pleurx catheters placed  Interim History / Subjective:   Complains of right-sided pain Pain medication helps Objective   Blood pressure 121/82, pulse 91, temperature (!) 97.3 F (36.3 C), temperature source Oral, resp. rate 16, height 5\' 4"  (1.626 m), weight 65.9 kg, last menstrual period 12/07/2018, SpO2 99%.        Intake/Output Summary (Last 24  hours) at 01/29/2024 1424 Last data filed at 01/29/2024 7829 Gross per 24 hour  Intake 270.2 ml  Output 650 ml  Net -379.8 ml   Filed Weights   01/27/24 0706  Weight: 65.9 kg   General:  thin, poorly nourished F resting in bed in NAD HEENT: MM pink/moist Neuro: alert and oriented, non-focal  CV: s1s2 rrr, no m/r/g PULM: No accessory muscle use, decreased breath sounds bilateral GI: soft, diffuse upper abdominal TTP, mildly distended  Extremities: warm/dry, no edema  Skin: Tegaderm on skin metastatic lesions   Resolved Hospital Problem list     Assessment & Plan:    Acute on chronic hypoxic respiratory failure secondary to bilateral malignant pleural effusions -3/26 Bilateral Pleurx catheter placed, follow-up chest x-ray shows good position, 800 cc drained both sides  -Drain right side today and redress prior to discharge -Placed social work consult for home health needs, Pleurx catheter can be drained once per week -palliative care and oncology are following -Tramadol as needed for severe pain  We will provide follow-up in the office for suture removal in 1 to 2 weeks  Best Practice (right click and "Reselect all SmartList Selections" daily)  Per primary team    Labs   CBC: Recent Labs  Lab 01/26/24 2239 01/26/24 2300 01/27/24 0654 01/28/24 0500 01/29/24 0617  WBC 6.1  --  4.9 4.7 3.7*  NEUTROABS 4.3  --  3.2  --  2.2  HGB 8.0* 8.8* 7.2* 7.3* 7.0*  HCT 26.7* 26.0* 24.0* 23.2* 24.4*  MCV 80.4  --  81.1 79.5* 84.1  PLT 838*  --  763* 770* 833*    Basic  Metabolic Panel: Recent Labs  Lab 01/26/24 2239 01/26/24 2300 01/27/24 0654 01/28/24 0500 01/29/24 0617  NA 133* 133* 133* 135 134*  K 3.6 3.6 3.7 3.6 3.8  CL 100 100 100 103 103  CO2 22  --  24 25 24   GLUCOSE 60* 57* 62* 80 59*  BUN 20 17 17 20  23*  CREATININE 0.84 0.90 0.71 0.89 0.79  CALCIUM 8.1*  --  7.8* 7.9* 8.0*   GFR: Estimated Creatinine Clearance: 80.4 mL/min (by C-G formula based on SCr  of 0.79 mg/dL). Recent Labs  Lab 01/26/24 2239 01/26/24 2255 01/27/24 0024 01/27/24 0654 01/28/24 0500 01/29/24 0617  WBC 6.1  --   --  4.9 4.7 3.7*  LATICACIDVEN  --  0.9 0.7  --   --   --     Liver Function Tests: Recent Labs  Lab 01/26/24 2239 01/27/24 0654 01/28/24 0500 01/29/24 0617  AST 152* 140* 135* 126*  ALT 72* 66* 67* 62*  ALKPHOS 575* 545* 507* 463*  BILITOT 0.9 0.7 0.5 0.5  PROT 4.9* 4.6* 4.7* 4.5*  ALBUMIN 2.0* 2.0* 2.0* 1.8*   No results for input(s): "LIPASE", "AMYLASE" in the last 168 hours. No results for input(s): "AMMONIA" in the last 168 hours.  ABG    Component Value Date/Time   HCO3 26.0 01/26/2024 2239   TCO2 22 01/26/2024 2300   O2SAT 66 01/26/2024 2239     Coagulation Profile: Recent Labs  Lab 01/26/24 2239  INR 1.0    Cardiac Enzymes: No results for input(s): "CKTOTAL", "CKMB", "CKMBINDEX", "TROPONINI" in the last 168 hours.  HbA1C: No results found for: "HGBA1C"  CBG: No results for input(s): "GLUCAP" in the last 168 hours.   Cyril Mourning MD. Tonny Bollman. [facility] Pager : [phone]  If no response to pager , please call [phone] until 7 pm After 7:00 pm call Elink  [phone]   01/29/2024

## 2024-01-29 NOTE — Discharge Summary (Signed)
 [facility] Phys[facility] Discharge Summary   Patient name: Taylor Flynn  Admit date:     01/26/2024  Discharge date: 01/29/2024  Attending Physician: Alba Cory [[id number]]  Discharge Physician: Carollee Herter   PCP: Josph Macho, MD  Admitted From: Home Disposition:  Home  Recommendations for Outpatient Follow-up:  Follow up with PCP in 1-2 weeks Follow up with oncology on 02-02-2024  Home Health:No Equipment/Devices: Bilateral PleurX catheters  Discharge Condition:Hospice CODE STATUS:FULL Diet recommendation: Regular Fluid Restriction: None  Hospital Summary: HPI: Taylor Flynn is a 48 y.o. female with history of metastatic breast cancer recently moved from [place] was admitted 3 weeks ago at [facility] for pleural effusion requiring chest tube placement and also was admitted again for abdominal discomfort with fluid but eventually patient decided to come to [place] to live with her family presents to the ER because of increasing shortness of breath with pleuritic chest pain since last night.  Denies any productive cough fever or chills.  Has noticed increased swelling in the right upper extremity and abdomen.  Patient is presently on trastuzumab and chemotherapy.  Patient previously used to follow with Dr. Arlan Organ oncologist and has appointment with Dr. Myna Hidalgo in the coming weeks.   ED Course: In the ER patient was requiring 3 L oxygen to maintain sats and has bilateral pleural effusion.  Given the pleuritic chest pain CT angiogram of the chest has been ordered.  Results are pending.  Labs show BNP of 47 lactic acid 0.9 hemoglobin 8 which is at baseline when compared to recent past.  LFTs elevated likely from metastasis.  Significant Events: Admitted 01/26/2024 for acute respiratory failure with hypoxia with bilateral pleural effusions   Significant Labs: Na 133, K 3.6, CO2 of 22, BUN 20, Scr 0.84, glu 60 WBC 6.1, HgB 8, plt 838  Significant Imaging  Studies: CXR Diffuse airspace disease in the lungs bilaterally, possible edema or infiltrate. 2. Moderate right pleural effusion and small left pleural effusion. CTPA Negative for pulmonary embolism. 2. Widespread pulmonary and hepatic metastatic disease. Patchy bilateral pulmonary opacification could be confluent metastases or pneumonia. 3. Ascites and large bilateral pleural effusion. 4. Left hydronephrosis. Abd U/S Cirrhotic liver configuration. No discrete focal liver lesion seen on this limited exam. Mild to moderate ascites. No splenomegaly. 2. Mild fullness in the right renal collecting system. Mild-to-moderate left hydronephrosis. 3. Partially contracted gallbladder with sludge. No sonographic evidence of acute cholecystitis.  Antibiotic Therapy: Anti-infectives (From admission, onward)    Start     Dose/Rate Route Frequency Ordered Stop   01/27/24 2200  vancomycin (VANCOREADY) IVPB 750 mg/150 mL  Status:  Discontinued        750 mg 150 mL/hr over 60 Minutes Intravenous Every 12 hours 01/27/24 0951 01/27/24 1402   01/27/24 1700  ceFEPIme (MAXIPIME) 2 g in sodium chloride 0.9 % 100 mL IVPB        2 g 200 mL/hr over 30 Minutes Intravenous Every 8 hours 01/27/24 0953     01/27/24 0800  vancomycin (VANCOREADY) IVPB 1500 mg/300 mL        1,500 mg 150 mL/hr over 120 Minutes Intravenous  Once 01/27/24 0749 01/27/24 1254   01/27/24 0715  vancomycin (VANCOCIN) IVPB 1000 mg/200 mL premix  Status:  Discontinued        1,000 mg 200 mL/hr over 60 Minutes Intravenous  Once 01/27/24 0700 01/27/24 0749   01/27/24 0715  ceFEPIme (MAXIPIME) 2 g in sodium chloride 0.9 % 100  mL IVPB        2 g 200 mL/hr over 30 Minutes Intravenous  Once 01/27/24 0700 01/27/24 4854       Procedures: Bilateral PleurX catheter placement by PCCM  Consultants: Palliative care [facility] Course by Problem: * Acute hypoxic respiratory failure (HCC) 01-28-2024 due to malignant pleural effusions. Will  order home O2.  01-29-2024 pt weaned to RA while at rest but feels she need supplemental O2 when she is moving around.  Abdominal ascites 01-28-2024 bedside abd U/S shows only minimal abd ascites. She has profound hepatomegaly with her liver edge at the level of her umbilicus. Liver appearance on U/S consistent with liver mets. Amount of ascites is not very large. Risks of paracentesis including bowel perforation, etc, are GREATER than any potential benefits of paracentesis. Pt agrees. No indication for paracentesis at this time.  Pressure injury of skin Present on admission.  Pressure Injury 01/27/24 Buttocks Right;Upper Stage 2 -  Partial thickness loss of dermis presenting as a shallow open injury with a red, pink wound bed without slough. (Active)  01/27/24   Location: Buttocks  Location Orientation: Right;Upper  Staging: Stage 2 -  Partial thickness loss of dermis presenting as a shallow open injury with a red, pink wound bed without slough.  Wound Description (Comments):   Present on Admission: Yes        S/P chest tube placement [facility]) - bilateral PleurX catheter placed 01-28-2024 by PCCM. 01-28-2024 pt will need drainage supplies for home use. Prn IV morphine.  01-29-2024 DC to home with prn 5 mg oxycodone. Pt advised to take laxative if using oxycodone on a regular basis. Advised her not to use [place] if she is going to use oxycodone to avoid precipitated withdrawal.  Metastasis from breast cancer (HCC) 01-28-2024 multiple mets. Followed by oncology.  Bilateral pleural effusion 01-28-2024 presumed to be malignant.  Breast cancer metastasized to liver, unspecified laterality (HCC) 01-28-2024 pt gets insurance through [place]. [facility] and [facility]. CM attempting to find hospice agency that will accept pt as a client with concurrent care(hospice + palliative chemo).  01-29-2024 DC to home today with po oxycodone prn and po ativan prn.  De-access port-a-cath. Recommend pt consume at least 1-1.5 g protein per kilo of body weight.  Shortness of breath-resolved as of 01/29/2024 01-28-2024 due to bilateral pleural effusions.  01-29-2024 improved after drainage of her bilateral effusions.  Elevated LFTs 01-28-2024 likely due to liver mets  Edema of right upper extremity 01-28-2024 no DVT on UE U/S.  Iron deficiency anemia 01-28-2024 may need outpatient IV iron. No role for PRBC transfusion at this time. Pt is not hypotensive.  01-29-2024 f/u with oncology in office in 02-02-2024. Oncology can decide if pt needs IV iron.    Discharge Diagnoses:  Principal Problem:   Acute hypoxic respiratory failure (HCC) Active Problems:   Breast cancer metastasized to liver, unspecified laterality (HCC)   Bilateral pleural effusion   Metastasis from breast cancer (HCC)   S/P chest tube placement [facility]) - bilateral PleurX catheter placed 01-28-2024 by PCCM.   Pressure injury of skin   Abdominal ascites   Iron deficiency anemia   Edema of right upper extremity   Elevated LFTs   Need for emotional support   Counseling and coordination of care   Palliative care encounter   Discharge Instructions  Discharge Instructions     Call MD for:  extreme fatigue   Complete  by: As directed    Call MD for:  persistant dizziness or light-headedness   Complete by: As directed    Call MD for:  persistant nausea and vomiting   Complete by: As directed    Call MD for:  severe uncontrolled pain   Complete by: As directed    Call MD for:  temperature >100.4   Complete by: As directed    Diet - low sodium heart healthy   Complete by: As directed    Discharge instructions   Complete by: As directed    1. Follow up with your primary care provider in 1-2 weeks following discharge from hospital. 2. Follow up with outpatient hospice care. 3. Follow up with oncology. 4. Drain your PleurX catheters(right or left side) only if you feel short of  breath. Do not routinely drain each catheter if not needed.   Discharge wound care:   Complete by: As directed    Pressure Injury 01/27/24 Buttocks Right;Upper Stage 2 -  Partial thickness loss of dermis presenting as a shallow open injury with a red, pink wound bed without slough.  Foam dressing to sacrum/buttocks, change Q 3 days or PRN soiling.  If frequently stooling, then discontinue use of sacral foam dressing and use the square foam.   Increase activity slowly   Complete by: As directed       Allergies as of 01/29/2024       Reactions   Aspirin Swelling   Angioedema    Sulfa Antibiotics Rash        Medication List     PAUSE taking these medications    Buprenorphine HCl 75 MCG Film Wait to take this until your doctor or other care provider tells you to start again. Place 1 Film inside cheek 2 (two) times daily as needed (For PAin).       STOP taking these medications    MELATONIN PO   NALTREXONE HCL PO   OLANZapine 2.5 MG tablet Commonly known as: ZYPREXA   OVER THE COUNTER MEDICATION   VITAMIN C PO   Vitamin D 125 MCG (5000 UT) Caps   VITAMIN K2 PO       TAKE these medications    acetaminophen 500 MG tablet Commonly known as: TYLENOL Take 500 mg by mouth every 6 (six) hours as needed for mild pain (pain score 1-3).   Advil 200 MG tablet Generic drug: ibuprofen Take 200 mg by mouth every 6 (six) hours as needed for mild pain (pain score 1-3) or moderate pain (pain score 4-6).   dexamethasone 4 MG tablet Commonly known as: DECADRON Take 4 mg by mouth as directed.  Take 1 tablet (4 mg total) by mouth 2 times a day with breakfast & dinner for three days starting the day after outpatient clinic chemotherapy infusion   LORazepam 0.5 MG tablet Commonly known as: ATIVAN Take 1 tablet (0.5 mg total) by mouth at bedtime as needed for anxiety or sleep.   metroNIDAZOLE 1 % gel Commonly known as: METROGEL Apply 1 Application topically daily.   NON  FORMULARY 2 (two) times daily. Pancreatic enzymes 3 caps AC and 2 caps with meals.   oxyCODONE 5 MG immediate release tablet Commonly known as: Oxy IR/ROXICODONE Take 1 tablet (5 mg total) by mouth every 4 (four) hours as needed for up to 7 days for moderate pain (pain score 4-6) or severe pain (pain score 7-10).   PRESCRIPTION MEDICATION Inject 1 mL into the skin every Monday, Wednesday,  and Friday. mistletoe   prochlorperazine 10 MG tablet Commonly known as: COMPAZINE Take 10 mg by mouth every 6 (six) hours as needed for nausea or vomiting.   senna 8.6 MG tablet Commonly known as: SENOKOT Take 1 tablet by mouth 2 (two) times daily.   TURMERIC CURCUMIN PO Take 1 tablet by mouth daily at 6 (six) AM.   ZINC PO Take 1 tablet by mouth daily at 6 (six) AM.               Durable Medical Equipment  (From admission, onward)           Start     Ordered   01/28/24 1703  For home use only DME oxygen  Once       Question Answer Comment  Length of Need Lifetime   Mode or (Route) Nasal cannula   Liters per Minute 2   Frequency Continuous (stationary and portable oxygen unit needed)   Oxygen conserving device Yes   Oxygen delivery system Gas      01/28/24 1702              Discharge Care Instructions  (From admission, onward)           Start     Ordered   01/29/24 0000  Discharge wound care:       Comments: Pressure Injury 01/27/24 Buttocks Right;Upper Stage 2 -  Partial thickness loss of dermis presenting as a shallow open injury with a red, pink wound bed without slough.  Foam dressing to sacrum/buttocks, change Q 3 days or PRN soiling.  If frequently stooling, then discontinue use of sacral foam dressing and use the square foam.   01/29/24 1359            Allergies  Allergen Reactions   Aspirin Swelling    Angioedema    Sulfa Antibiotics Rash    Discharge Exam: Vitals:   01/29/24 0555 01/29/24 1324  BP: 110/74 121/82  Pulse: 90 91  Resp:  18 16  Temp: 97.6 F (36.4 C) (!) 97.3 F (36.3 C)  SpO2: 97% 99%    Physical Exam  The results of significant diagnostics from this hospitalization (including imaging, microbiology, ancillary and laboratory) are listed below for reference.    Microbiology: Recent Results (from the past 240 hours)  Resp panel by RT-PCR (RSV, Flu A&B, Covid) Anterior Nasal Swab     Status: None   Collection Time: 01/26/24 10:39 PM   Specimen: Anterior Nasal Swab  Result Value Ref Range Status   SARS Coronavirus 2 by RT PCR NEGATIVE NEGATIVE Final    Comment: (NOTE) SARS-CoV-2 target nucleic acids are NOT DETECTED.  The SARS-CoV-2 RNA is generally detectable in upper respiratory specimens during the acute phase of infection. The lowest concentration of SARS-CoV-2 viral copies this assay can detect is 138 copies/mL. A negative result does not preclude SARS-Cov-2 infection and should not be used as the sole basis for treatment or other patient management decisions. A negative result may occur with  improper specimen collection/handling, submission of specimen other than nasopharyngeal swab, presence of viral mutation(s) within the areas targeted by this assay, and inadequate number of viral copies(<138 copies/mL). A negative result must be combined with clinical observations, patient history, and epidemiological information. The expected result is Negative.  Fact Sheet for Patients:  BloggerCourse.com  Fact Sheet for Healthcare Providers:  SeriousBroker.it  This test is no t yet approved or cleared by the [place] FDA and  has  been authorized for detection and/or diagnosis of SARS-CoV-2 by FDA under an Emergency Use Authorization (EUA). This EUA will remain  in effect (meaning this test can be used) for the duration of the COVID-19 declaration under Section 564(b)(1) of the Act, 21 U.S.C.section 360bbb-3(b)(1), unless the authorization is  terminated  or revoked sooner.       Influenza A by PCR NEGATIVE NEGATIVE Final   Influenza B by PCR NEGATIVE NEGATIVE Final    Comment: (NOTE) The Xpert Xpress SARS-CoV-2/FLU/RSV plus assay is intended as an aid in the diagnosis of influenza from Nasopharyngeal swab specimens and should not be used as a sole basis for treatment. Nasal washings and aspirates are unacceptable for Xpert Xpress SARS-CoV-2/FLU/RSV testing.  Fact Sheet for Patients: BloggerCourse.com  Fact Sheet for Healthcare Providers: SeriousBroker.it  This test is not yet approved or cleared by the [place] FDA and has been authorized for detection and/or diagnosis of SARS-CoV-2 by FDA under an Emergency Use Authorization (EUA). This EUA will remain in effect (meaning this test can be used) for the duration of the COVID-19 declaration under Section 564(b)(1) of the Act, 21 U.S.C. section 360bbb-3(b)(1), unless the authorization is terminated or revoked.     Resp Syncytial Virus by PCR NEGATIVE NEGATIVE Final    Comment: (NOTE) Fact Sheet for Patients: BloggerCourse.com  Fact Sheet for Healthcare Providers: SeriousBroker.it  This test is not yet approved or cleared by the [place] FDA and has been authorized for detection and/or diagnosis of SARS-CoV-2 by FDA under an Emergency Use Authorization (EUA). This EUA will remain in effect (meaning this test can be used) for the duration of the COVID-19 declaration under Section 564(b)(1) of the Act, 21 U.S.C. section 360bbb-3(b)(1), unless the authorization is terminated or revoked.  Performed at [facility], [address], [place], [place] [address]   Blood culture (routine x 2)     Status: None (Preliminary result)   Collection Time: 01/26/24 10:39 PM   Specimen: BLOOD  Result Value Ref Range Status   Specimen Description    Final    BLOOD PORTA CATH Performed at [facility], [address] [place], [place] [address]    Special Requests   Final    BOTTLES DRAWN AEROBIC AND ANAEROBIC Blood Culture results may not be optimal due to an inadequate volume of blood received in culture bottles Performed at [facility], [address] [place], [place] [address]    Culture   Final    NO GROWTH 3 DAYS Performed at [facility], [address], [place], [place] [address]    Report Status PENDING  Incomplete  MRSA Next Gen by PCR, Nasal     Status: None   Collection Time: 01/27/24  9:27 AM   Specimen: Nasal Mucosa; Nasal Swab  Result Value Ref Range Status   MRSA by PCR Next Gen NOT DETECTED NOT DETECTED Final    Comment: (NOTE) The GeneXpert MRSA Assay (FDA approved for NASAL specimens only), is one component of a comprehensive MRSA colonization surveillance program. It is not intended to diagnose MRSA infection nor to guide or monitor treatment for MRSA infections. Test performance is not FDA approved in patients less than 19 years old. Performed at [facility], [address] [place], [place] [address]      Labs: BNP (last 3 results) Recent Labs    01/26/24 2239  BNP 47.0   Basic Metabolic Panel: Recent Labs  Lab  01/26/24 2239 01/26/24 2300 01/27/24 0654 01/28/24 0500 01/29/24 0617  NA 133* 133* 133* 135 134*  K 3.6 3.6 3.7 3.6 3.8  CL 100 100 100 103 103  CO2 22  --  24 25 24   GLUCOSE 60* 57* 62* 80 59*  BUN 20 17 17 20  23*  CREATININE 0.84 0.90 0.71 0.89 0.79  CALCIUM 8.1*  --  7.8* 7.9* 8.0*   Lab Results  Component Value Date/Time   PREALBUMIN 8 (L) 01/28/2024 09:06 AM   Lab Results  Component Value Date/Time   CA2729 145.1 (H) 01/28/2024 09:07 AM   Liver Function Tests: Recent Labs  Lab 01/26/24 2239 01/27/24 0654 01/28/24 0500 01/29/24 0617  AST 152* 140* 135* 126*  ALT 72* 66* 67* 62*  ALKPHOS 575*  545* 507* 463*  BILITOT 0.9 0.7 0.5 0.5  PROT 4.9* 4.6* 4.7* 4.5*  ALBUMIN 2.0* 2.0* 2.0* 1.8*   CBC: Recent Labs  Lab 01/26/24 2239 01/26/24 2300 01/27/24 0654 01/28/24 0500 01/29/24 0617  WBC 6.1  --  4.9 4.7 3.7*  NEUTROABS 4.3  --  3.2  --  2.2  HGB 8.0* 8.8* 7.2* 7.3* 7.0*  HCT 26.7* 26.0* 24.0* 23.2* 24.4*  MCV 80.4  --  81.1 79.5* 84.1  PLT 838*  --  763* 770* 833*    BNP: Recent Labs  Lab 01/26/24 2239  BNP 47.0   Anemia work up Recent Labs    01/27/24 0654  VITAMINB12 1,756*  FOLATE 16.1  FERRITIN 623*  TIBC 245*  IRON 15*  RETICCTPCT 4.1*   Sepsis Labs Recent Labs  Lab 01/26/24 2239 01/27/24 0654 01/28/24 0500 01/29/24 0617  WBC 6.1 4.9 4.7 3.7*    Procedures/Studies: DG CHEST PORT 1 VIEW Result Date: 01/28/2024 CLINICAL DATA:  Status post left-sided chest tube placement. EXAM: PORTABLE CHEST 1 VIEW COMPARISON:  January 26, 2024. FINDINGS: The heart size and mediastinal contours are within normal limits. Right internal jugular Port-A-Cath is unchanged. Stable bilateral lung opacities are noted concerning for pneumonia or edema. There is been interval placement bilateral chest tubes. Right pleural effusion is slightly decreased compared to prior exam. Small left pleural effusion remains. Small bilateral apical pneumothoraces are noted. The visualized skeletal structures are unremarkable. IMPRESSION: Interval placement of bilateral chest tubes with small bilateral apical pneumothoraces noted. Right pleural effusion is slightly decreased compared to prior exam. Grossly stable small left pleural effusion is noted. Stable bilateral lung opacities as noted above. Electronically Signed   By: Lupita Raider M.D.   On: 01/28/2024 18:53   MR BRAIN W WO CONTRAST Result Date: 01/28/2024 CLINICAL DATA:  Breast cancer, invasive, stage IV. EXAM: MRI HEAD WITHOUT AND WITH CONTRAST TECHNIQUE: Multiplanar, multiecho pulse sequences of the brain and surrounding structures  were obtained without and with intravenous contrast. CONTRAST:  6mL GADAVIST GADOBUTROL 1 MMOL/ML IV SOLN COMPARISON:  11/20/2021.  05/18/2021. FINDINGS: Brain: No evidence residual or new metastatic disease. The only imaging residua of the previously treated disease are small foci of encephalomalacia at the posterior right cerebellum and the lateral left cerebellum. No evidence of residual enhancing tissue. No evidence of stroke, hemorrhage, hydrocephalus or extra-axial collection. No other abnormal brain or leptomeningeal enhancement occurs. Vascular: Major vessels at the base of the brain show flow. Skull and upper cervical spine: Negative Sinuses/Orbits: Clear/normal Other: None IMPRESSION: No evidence of residual or new metastatic disease. Small foci of encephalomalacia at the posterior right cerebellum and the lateral left cerebellum, sequela of previously treated lesions.  Electronically Signed   By: Paulina Fusi M.D.   On: 01/28/2024 10:54   VAS [place] UPPER EXTREMITY VENOUS DUPLEX Result Date: 01/27/2024 UPPER VENOUS STUDY  Patient Name:  BONI MACLELLAN  Date of Exam:   01/27/2024 Medical Rec #: [id number]     Accession #:    [id number] Date of Birth: 1975/12/03      Patient Gender: F Patient Age:   76 years Exam Location:  [facility] Procedure:      VAS [place] UPPER EXTREMITY VENOUS DUPLEX Referring Phys: Midge Minium --------------------------------------------------------------------------------  Indications: Edema Risk Factors: Cancer. Limitations: Line and Right subclavian port. Comparison Study: No prior studies. Performing Technologist: Chanda Busing RVT  Examination Guidelines: A complete evaluation includes B-mode imaging, spectral Doppler, color Doppler, and power Doppler as needed of all accessible portions of each vessel. Bilateral testing is considered an integral part of a complete examination. Limited examinations for reoccurring indications may be performed as noted.  Right Findings:  +----------+------------+---------+-----------+----------+--------------+ RIGHT     CompressiblePhasicitySpontaneousProperties   Summary     +----------+------------+---------+-----------+----------+--------------+ IJV           Full       Yes       Yes                             +----------+------------+---------+-----------+----------+--------------+ Subclavian                                          Not visualized +----------+------------+---------+-----------+----------+--------------+ Axillary      Full       Yes       Yes                             +----------+------------+---------+-----------+----------+--------------+ Brachial      Full                                                 +----------+------------+---------+-----------+----------+--------------+ Radial        Full                                                 +----------+------------+---------+-----------+----------+--------------+ Ulnar         Full                                                 +----------+------------+---------+-----------+----------+--------------+ Cephalic      Full                                                 +----------+------------+---------+-----------+----------+--------------+ Basilic       Full                                                 +----------+------------+---------+-----------+----------+--------------+  Left Findings: +----------+------------+---------+-----------+----------+-------+ LEFT      CompressiblePhasicitySpontaneousPropertiesSummary +----------+------------+---------+-----------+----------+-------+ Subclavian    Full       Yes       Yes                      +----------+------------+---------+-----------+----------+-------+  Summary:  Right: No evidence of deep vein thrombosis in the upper extremity. No evidence of superficial vein thrombosis in the upper extremity.  Left: No evidence of thrombosis in the  subclavian.  *See table(s) above for measurements and observations.  Diagnosing physician: Carolynn Sayers Electronically signed by Carolynn Sayers on 01/27/2024 at 12:19:24 PM.    Final    US Abdomen Complete Result Date: 01/27/2024 CLINICAL DATA:  [id number] Abdominal pain [id number] EXAM: ABDOMEN ULTRASOUND COMPLETE COMPARISON:  None Available. FINDINGS: Gallbladder: The gallbladder is only partially visualized and appears partially contracted. Disease sludge within the gallbladder. Mild wall thickening in the visualized portion. The technologist noted a negative Murphy sign. Common bile duct: Diameter: Up to 3 mm. Intrahepatic bile duct dilation. Liver: There is diffuse liver surface nodularity and heterogeneous echotexture/echogenicity, compatible with cirrhotic configuration. No discrete focal mass seen on this limited exam. Portal vein is patent on color Doppler imaging with normal direction of blood flow towards the liver. IVC: No abnormality in the visualized portion. Pancreas: No abnormality in the visualized portion. Spleen: Size and appearance within normal limits. Right Kidney: Length: 8.5 cm. Echogenicity within normal limits. There is mild fullness in the collecting system. No mass visualized. Left Kidney: Length: 9.6 cm. Echogenicity within normal limits. There is mild moderate hydronephrosis. No mass visualized. Abdominal aorta: No aneurysm visualized. Other findings: Note is made of mild to moderate ascites. IMPRESSION: 1. Cirrhotic liver configuration. No discrete focal liver lesion seen on this limited exam. Mild to moderate ascites. No splenomegaly. 2. Mild fullness in the right renal collecting system. Mild-to-moderate left hydronephrosis. 3. Partially contracted gallbladder with sludge. No sonographic evidence of acute cholecystitis. Electronically Signed   By: Jules Schick M.D.   On: 01/27/2024 10:32   CT Angio Chest Pulmonary Embolism (PE) W or WO Contrast Result Date: 01/27/2024 CLINICAL DATA:   Pulmonary embolism suspected, high probability. Recent thoracentesis. History of metastatic breast cancer. EXAM: CT ANGIOGRAPHY CHEST WITH CONTRAST TECHNIQUE: Multidetector CT imaging of the chest was performed using the standard protocol during bolus administration of intravenous contrast. Multiplanar CT image reconstructions and MIPs were obtained to evaluate the vascular anatomy. RADIATION DOSE REDUCTION: This exam was performed according to the departmental dose-optimization program which includes automated exposure control, adjustment of the mA and/or kV according to patient size and/or use of iterative reconstruction technique. CONTRAST:  75mL OMNIPAQUE IOHEXOL 350 MG/ML SOLN COMPARISON:  03/21/2022 PET-CT. FINDINGS: Cardiovascular: Satisfactory opacification of the pulmonary arteries to the segmental level. No evidence of pulmonary embolism. Normal heart size. No pericardial effusion. Unremarkable positioning of right-sided porta catheter. Mediastinum/Nodes: Negative for mediastinal mass. Bilateral breast skin thickening and subcutaneous reticulation. Indeterminate reticulation at the bilateral axilla without discrete enlarged lymph node. Lungs/Pleura: Irregular nodules throughout the lungs which are numerous. Areas of more broad opacification in the lower lungs which could be confluent metastases or areas of pneumonia. Large bilateral pleural effusion. It is difficult to detect discrete pleural nodularity or thickening on this arterial study. Upper Abdomen: Widespread hepatic masses compatible with metastatic disease. The partially covered left kidney is hydronephrotic. Abdominal ascites which is moderate. Musculoskeletal: No acute finding Review of the MIP images confirms the above findings. IMPRESSION: 1.  Negative for pulmonary embolism. 2. Widespread pulmonary and hepatic metastatic disease. Patchy bilateral pulmonary opacification could be confluent metastases or pneumonia. 3. Ascites and large bilateral  pleural effusion. 4. Left hydronephrosis. Electronically Signed   By: Tiburcio Pea M.D.   On: 01/27/2024 05:55   DG Chest 2 View Result Date: 01/26/2024 CLINICAL DATA:  Shortness of breath, abdominal distention, and jaundice. Recent thoracentesis. EXAM: CHEST - 2 VIEW COMPARISON:  03/21/2022. FINDINGS: The heart size and mediastinal contours are within normal limits. A right chest port terminates over the superior vena cava. Diffuse patchy airspace disease is noted in the lungs bilaterally with a basilar predominance there is a moderate right pleural effusion and a small left pleural effusion. No pneumothorax is seen. No acute osseous abnormality. IMPRESSION: 1. Diffuse airspace disease in the lungs bilaterally, possible edema or infiltrate. 2. Moderate right pleural effusion and small left pleural effusion. Electronically Signed   By: Thornell Sartorius M.D.   On: 01/26/2024 21:39    Time coordinating discharge: 45 mins  SIGNED:  Carollee Herter, DO [facility] 01/29/24, 2:18 PM

## 2024-01-29 NOTE — Telephone Encounter (Signed)
 Needs hospital follow-up for bilateral Pleurx catheter for suture removal with APP in 1 to 2 weeks please Plan for drainage is once every week

## 2024-01-29 NOTE — Progress Notes (Signed)
 Patient feel like her pain is not under control stating that when she take something for pain it make her sleepy but still wakes up in a lot of pain. She don't think she can go home in this condition.

## 2024-01-29 NOTE — Telephone Encounter (Signed)
 There was a hospital opening at [facility] on 4/3 which is 1 wk.  I scheduled visit as this is the only time that was open.    I attempted to reach patient x1.  No answer.  VM full.  Sent mychart message to make her aware.

## 2024-01-29 NOTE — Progress Notes (Signed)
   This pt was referred to hospice services for hospice services at home concurrent with VA benefit. We have submitted the requested records for VA to review and approve concurrent care. The VA does agree that the pt can have both hospice care and continue her therapies if appropriate under her oncologist.    The pt has been been approved for hospice services under [facility] home care services. The pt's sister [name] reports that the pt has a  hospital bed at the home already that was suppose to be picked up.  This is through [facility]. We have cancel that pick up and added additional equipment of oxygen and alternating pressure mattress pad. The pt also owned a [facility] shower chair and had an over bed table already at her sisters home.   We will follow and assist with coordination of hospice care at home when stable for d/c.   Norm Parcel RN [phone]

## 2024-01-29 NOTE — Progress Notes (Signed)
 PROGRESS NOTE    Taylor Flynn  ZOX:[id number] DOB: Sep 14, 1976 DOA: 01/26/2024 PCP: Josph Macho, MD  Subjective: Pt seen and examined. Oxycodone 2.5 does not help with pain but 5 mg dose. Pt worried about feeling sleepy with oxycodone. Discussed that there is no way around the sedative effects of pain medications. Discussed that pain meds only cover up the pain but does not eliminate the source. The source of her pain is the recently placed PleurX catheter. If pt wants to remove source of pain, then PleurX catheter needs to be removed. But that was negate the whole reason it was placed which is to help manage her malignant effusion.  Pt ready for DC to home. Discussed addiction potential of oxycodone and ativan. Pt is aware of the risks.   Hospital Course: HPI: Taylor Flynn is a 48 y.o. female with history of metastatic breast cancer recently moved from [place] was admitted 3 weeks ago at [facility] for pleural effusion requiring chest tube placement and also was admitted again for abdominal discomfort with fluid but eventually patient decided to come to [place] to live with her family presents to the ER because of increasing shortness of breath with pleuritic chest pain since last night.  Denies any productive cough fever or chills.  Has noticed increased swelling in the right upper extremity and abdomen.  Patient is presently on trastuzumab and chemotherapy.  Patient previously used to follow with Dr. Arlan Organ oncologist and has appointment with Dr. Myna Hidalgo in the coming weeks.   ED Course: In the ER patient was requiring 3 L oxygen to maintain sats and has bilateral pleural effusion.  Given the pleuritic chest pain CT angiogram of the chest has been ordered.  Results are pending.  Labs show BNP of 47 lactic acid 0.9 hemoglobin 8 which is at baseline when compared to recent past.  LFTs elevated likely from metastasis.  Significant Events: Admitted 01/26/2024 for acute  respiratory failure with hypoxia with bilateral pleural effusions   Significant Labs: Na 133, K 3.6, CO2 of 22, BUN 20, Scr 0.84, glu 60 WBC 6.1, HgB 8, plt 838  Significant Imaging Studies: CXR Diffuse airspace disease in the lungs bilaterally, possible edema or infiltrate. 2. Moderate right pleural effusion and small left pleural effusion. CTPA Negative for pulmonary embolism. 2. Widespread pulmonary and hepatic metastatic disease. Patchy bilateral pulmonary opacification could be confluent metastases or pneumonia. 3. Ascites and large bilateral pleural effusion. 4. Left hydronephrosis. Abd U/S Cirrhotic liver configuration. No discrete focal liver lesion seen on this limited exam. Mild to moderate ascites. No splenomegaly. 2. Mild fullness in the right renal collecting system. Mild-to-moderate left hydronephrosis. 3. Partially contracted gallbladder with sludge. No sonographic evidence of acute cholecystitis.  Antibiotic Therapy: Anti-infectives (From admission, onward)    Start     Dose/Rate Route Frequency Ordered Stop   01/27/24 2200  vancomycin (VANCOREADY) IVPB 750 mg/150 mL  Status:  Discontinued        750 mg 150 mL/hr over 60 Minutes Intravenous Every 12 hours 01/27/24 0951 01/27/24 1402   01/27/24 1700  ceFEPIme (MAXIPIME) 2 g in sodium chloride 0.9 % 100 mL IVPB        2 g 200 mL/hr over 30 Minutes Intravenous Every 8 hours 01/27/24 0953     01/27/24 0800  vancomycin (VANCOREADY) IVPB 1500 mg/300 mL        1,500 mg 150 mL/hr over 120 Minutes Intravenous  Once 01/27/24 0749 01/27/24 1254   01/27/24  0715  vancomycin (VANCOCIN) IVPB 1000 mg/200 mL premix  Status:  Discontinued        1,000 mg 200 mL/hr over 60 Minutes Intravenous  Once 01/27/24 0700 01/27/24 0749   01/27/24 0715  ceFEPIme (MAXIPIME) 2 g in sodium chloride 0.9 % 100 mL IVPB        2 g 200 mL/hr over 30 Minutes Intravenous  Once 01/27/24 0700 01/27/24 9604       Procedures: Bilateral PleurX catheter  placement by PCCM  Consultants: Palliative care PCCM oncology    Assessment and Plan: * Acute hypoxic respiratory failure (HCC) 01-28-2024 due to malignant pleural effusions. Will order home O2.  01-29-2024 pt weaned to RA while at rest but feels she need supplemental O2 when she is moving around.  Abdominal ascites 01-28-2024 bedside abd U/S shows only minimal abd ascites. She has profound hepatomegaly with her liver edge at the level of her umbilicus. Liver appearance on U/S consistent with liver mets. Amount of ascites is not very large. Risks of paracentesis including bowel perforation, etc, are GREATER than any potential benefits of paracentesis. Pt agrees. No indication for paracentesis at this time.  Pressure injury of skin Present on admission.  Pressure Injury 01/27/24 Buttocks Right;Upper Stage 2 -  Partial thickness loss of dermis presenting as a shallow open injury with a red, pink wound bed without slough. (Active)  01/27/24   Location: Buttocks  Location Orientation: Right;Upper  Staging: Stage 2 -  Partial thickness loss of dermis presenting as a shallow open injury with a red, pink wound bed without slough.  Wound Description (Comments):   Present on Admission: Yes        S/P chest tube placement [facility]) - bilateral PleurX catheter placed 01-28-2024 by PCCM. 01-28-2024 pt will need drainage supplies for home use. Prn IV morphine.  01-29-2024 DC to home with prn 5 mg oxycodone. Pt advised to take laxative if using oxycodone on a regular basis. Advised her not to use [place] if she is going to use oxycodone to avoid precipitated withdrawal.  Metastasis from breast cancer (HCC) 01-28-2024 multiple mets. Followed by oncology.  Bilateral pleural effusion 01-28-2024 presumed to be malignant.  Breast cancer metastasized to liver, unspecified laterality (HCC) 01-28-2024 pt gets insurance through [place]. [facility] has agreed to pay for hospice care and palliative chemo. CM  attempting to find hospice agency that will accept pt as a client with concurrent care(hospice + palliative chemo).  01-29-2024 DC to home today with po oxycodone prn and po ativan prn. De-access port-a-cath.  Shortness of breath-resolved as of 01/29/2024 01-28-2024 due to bilateral pleural effusions.  01-29-2024 improved after drainage of her bilateral effusions.  Elevated LFTs 01-28-2024 likely due to liver mets  Edema of right upper extremity 01-28-2024 no DVT on UE U/S.  Iron deficiency anemia 01-28-2024 may need outpatient IV iron. No role for PRBC transfusion at this time. Pt is not hypotensive.  01-29-2024 f/u with oncology in office in 02-02-2024. Oncology can decide if pt needs IV iron.  DVT prophylaxis: heparin injection 5,000 Units Start: 01/28/24 2200    Code Status: Full Code Family Communication: no family at bedside. She is decisional. Pt lives with her sister in [place].  Disposition Plan: return home Reason for continuing need for hospitalization: medically stable for DC.  Objective: Vitals:   01/28/24 1500 01/28/24 2057 01/29/24 0555 01/29/24 1324  BP: (!) 122/93 121/83 110/74 121/82  Pulse:  (!) 110 90 91  Resp: 19 18 18  16  Temp:  (!) 97.4 F (36.3 C) 97.6 F (36.4 C) (!) 97.3 F (36.3 C)  TempSrc:  Oral Oral Oral  SpO2: 100% 100% 97% 99%  Weight:      Height:        Intake/Output Summary (Last 24 hours) at 01/29/2024 1414 Last data filed at 01/29/2024 0939 Gross per 24 hour  Intake 270.2 ml  Output 1350 ml  Net -1079.8 ml   Filed Weights   01/27/24 0706  Weight: 65.9 kg    Examination:  Physical Exam Vitals and nursing note reviewed.  Constitutional:      General: She is not in acute distress.    Appearance: Normal appearance. She is not toxic-appearing.     Comments: Chronically ill appearing  HENT:     Head: Normocephalic and atraumatic.  Eyes:     General: No scleral icterus. Cardiovascular:     Rate and Rhythm: Normal rate and  regular rhythm.  Pulmonary:     Effort: Pulmonary effort is normal.     Breath sounds: Normal breath sounds.  Abdominal:     General: Bowel sounds are normal. There is distension.     Palpations: Abdomen is soft.     Tenderness: There is no abdominal tenderness.  Musculoskeletal:     Comments: Less edema of right UE.  Skin:    General: Skin is warm and dry.     Capillary Refill: Capillary refill takes less than 2 seconds.  Neurological:     General: No focal deficit present.     Mental Status: She is alert and oriented to person, place, and time.     Data Reviewed: I have personally reviewed following labs and imaging studies  CBC: Recent Labs  Lab 01/26/24 2239 01/26/24 2300 01/27/24 0654 01/28/24 0500 01/29/24 0617  WBC 6.1  --  4.9 4.7 3.7*  NEUTROABS 4.3  --  3.2  --  2.2  HGB 8.0* 8.8* 7.2* 7.3* 7.0*  HCT 26.7* 26.0* 24.0* 23.2* 24.4*  MCV 80.4  --  81.1 79.5* 84.1  PLT 838*  --  763* 770* 833*   Basic Metabolic Panel: Recent Labs  Lab 01/26/24 2239 01/26/24 2300 01/27/24 0654 01/28/24 0500 01/29/24 0617  NA 133* 133* 133* 135 134*  K 3.6 3.6 3.7 3.6 3.8  CL 100 100 100 103 103  CO2 22  --  24 25 24   GLUCOSE 60* 57* 62* 80 59*  BUN 20 17 17 20  23*  CREATININE 0.84 0.90 0.71 0.89 0.79  CALCIUM 8.1*  --  7.8* 7.9* 8.0*   GFR: Estimated Creatinine Clearance: 80.4 mL/min (by C-G formula based on SCr of 0.79 mg/dL). Liver Function Tests: Recent Labs  Lab 01/26/24 2239 01/27/24 0654 01/28/24 0500 01/29/24 0617  AST 152* 140* 135* 126*  ALT 72* 66* 67* 62*  ALKPHOS 575* 545* 507* 463*  BILITOT 0.9 0.7 0.5 0.5  PROT 4.9* 4.6* 4.7* 4.5*  ALBUMIN 2.0* 2.0* 2.0* 1.8*   Coagulation Profile: Recent Labs  Lab 01/26/24 2239  INR 1.0   BNP (last 3 results) Recent Labs    01/26/24 2239  BNP 47.0   Anemia Panel: Recent Labs    01/27/24 0654  VITAMINB12 1,756*  FOLATE 16.1  FERRITIN 623*  TIBC 245*  IRON 15*  RETICCTPCT 4.1*   Sepsis  Labs: Recent Labs  Lab 01/26/24 2255 01/27/24 0024  LATICACIDVEN 0.9 0.7    Recent Results (from the past 240 hours)  Resp panel by RT-PCR (RSV, Flu  A&B, Covid) Anterior Nasal Swab     Status: None   Collection Time: 01/26/24 10:39 PM   Specimen: Anterior Nasal Swab  Result Value Ref Range Status   SARS Coronavirus 2 by RT PCR NEGATIVE NEGATIVE Final    Comment: (NOTE) SARS-CoV-2 target nucleic acids are NOT DETECTED.  The SARS-CoV-2 RNA is generally detectable in upper respiratory specimens during the acute phase of infection. The lowest concentration of SARS-CoV-2 viral copies this assay can detect is 138 copies/mL. A negative result does not preclude SARS-Cov-2 infection and should not be used as the sole basis for treatment or other patient management decisions. A negative result may occur with  improper specimen collection/handling, submission of specimen other than nasopharyngeal swab, presence of viral mutation(s) within the areas targeted by this assay, and inadequate number of viral copies(<138 copies/mL). A negative result must be combined with clinical observations, patient history, and epidemiological information. The expected result is Negative.  Fact Sheet for Patients:  BloggerCourse.com  Fact Sheet for Healthcare Providers:  SeriousBroker.it  This test is no t yet approved or cleared by the [place] FDA and  has been authorized for detection and/or diagnosis of SARS-CoV-2 by FDA under an Emergency Use Authorization (EUA). This EUA will remain  in effect (meaning this test can be used) for the duration of the COVID-19 declaration under Section 564(b)(1) of the Act, 21 U.S.C.section 360bbb-3(b)(1), unless the authorization is terminated  or revoked sooner.       Influenza A by PCR NEGATIVE NEGATIVE Final   Influenza B by PCR NEGATIVE NEGATIVE Final    Comment: (NOTE) The Xpert Xpress  SARS-CoV-2/FLU/RSV plus assay is intended as an aid in the diagnosis of influenza from Nasopharyngeal swab specimens and should not be used as a sole basis for treatment. Nasal washings and aspirates are unacceptable for Xpert Xpress SARS-CoV-2/FLU/RSV testing.  Fact Sheet for Patients: BloggerCourse.com  Fact Sheet for Healthcare Providers: SeriousBroker.it  This test is not yet approved or cleared by the [place] FDA and has been authorized for detection and/or diagnosis of SARS-CoV-2 by FDA under an Emergency Use Authorization (EUA). This EUA will remain in effect (meaning this test can be used) for the duration of the COVID-19 declaration under Section 564(b)(1) of the Act, 21 U.S.C. section 360bbb-3(b)(1), unless the authorization is terminated or revoked.     Resp Syncytial Virus by PCR NEGATIVE NEGATIVE Final    Comment: (NOTE) Fact Sheet for Patients: BloggerCourse.com  Fact Sheet for Healthcare Providers: SeriousBroker.it  This test is not yet approved or cleared by the [place] FDA and has been authorized for detection and/or diagnosis of SARS-CoV-2 by FDA under an Emergency Use Authorization (EUA). This EUA will remain in effect (meaning this test can be used) for the duration of the COVID-19 declaration under Section 564(b)(1) of the Act, 21 U.S.C. section 360bbb-3(b)(1), unless the authorization is terminated or revoked.  Performed at [facility], [address] [place], [place] [address]   Blood culture (routine x 2)     Status: None (Preliminary result)   Collection Time: 01/26/24 10:39 PM   Specimen: BLOOD  Result Value Ref Range Status   Specimen Description   Final    BLOOD PORTA CATH Performed at [facility], [address] [place], [place] [address]    Special Requests   Final    BOTTLES DRAWN AEROBIC  AND ANAEROBIC Blood Culture results may not be optimal due to an inadequate volume of blood  received in culture bottles Performed at [facility], [address] [place], [place] [address]    Culture   Final    NO GROWTH 3 DAYS Performed at [facility], [address] [place], [place] [address]    Report Status PENDING  Incomplete  MRSA Next Gen by PCR, Nasal     Status: None   Collection Time: 01/27/24  9:27 AM   Specimen: Nasal Mucosa; Nasal Swab  Result Value Ref Range Status   MRSA by PCR Next Gen NOT DETECTED NOT DETECTED Final    Comment: (NOTE) The GeneXpert MRSA Assay (FDA approved for NASAL specimens only), is one component of a comprehensive MRSA colonization surveillance program. It is not intended to diagnose MRSA infection nor to guide or monitor treatment for MRSA infections. Test performance is not FDA approved in patients less than 88 years old. Performed at [facility], [address], [place], [place] [address]      Radiology Studies: DG CHEST PORT 1 VIEW Result Date: 01/28/2024 CLINICAL DATA:  Status post left-sided chest tube placement. EXAM: PORTABLE CHEST 1 VIEW COMPARISON:  January 26, 2024. FINDINGS: The heart size and mediastinal contours are within normal limits. Right internal jugular Port-A-Cath is unchanged. Stable bilateral lung opacities are noted concerning for pneumonia or edema. There is been interval placement bilateral chest tubes. Right pleural effusion is slightly decreased compared to prior exam. Small left pleural effusion remains. Small bilateral apical pneumothoraces are noted. The visualized skeletal structures are unremarkable. IMPRESSION: Interval placement of bilateral chest tubes with small bilateral apical pneumothoraces noted. Right pleural effusion is slightly decreased compared to prior exam. Grossly stable small left pleural effusion is noted. Stable bilateral lung opacities as noted above.  Electronically Signed   By: Lupita Raider M.D.   On: 01/28/2024 18:53   MR BRAIN W WO CONTRAST Result Date: 01/28/2024 CLINICAL DATA:  Breast cancer, invasive, stage IV. EXAM: MRI HEAD WITHOUT AND WITH CONTRAST TECHNIQUE: Multiplanar, multiecho pulse sequences of the brain and surrounding structures were obtained without and with intravenous contrast. CONTRAST:  6mL GADAVIST GADOBUTROL 1 MMOL/ML IV SOLN COMPARISON:  11/20/2021.  05/18/2021. FINDINGS: Brain: No evidence residual or new metastatic disease. The only imaging residua of the previously treated disease are small foci of encephalomalacia at the posterior right cerebellum and the lateral left cerebellum. No evidence of residual enhancing tissue. No evidence of stroke, hemorrhage, hydrocephalus or extra-axial collection. No other abnormal brain or leptomeningeal enhancement occurs. Vascular: Major vessels at the base of the brain show flow. Skull and upper cervical spine: Negative Sinuses/Orbits: Clear/normal Other: None IMPRESSION: No evidence of residual or new metastatic disease. Small foci of encephalomalacia at the posterior right cerebellum and the lateral left cerebellum, sequela of previously treated lesions. Electronically Signed   By: Paulina Fusi M.D.   On: 01/28/2024 10:54    Scheduled Meds:  heparin  5,000 Units Subcutaneous Q8H   lipase/protease/amylase  24,000 Units Oral TID AC   senna  1 tablet Oral BID   Continuous Infusions:   LOS: 2 days   Time spent: 40 minutes  Carollee Herter, DO  [facility]  01/29/2024, 2:14 PM

## 2024-01-29 NOTE — Progress Notes (Signed)
 Daily Progress Note   Patient Name: Taylor Flynn       Date: 01/29/2024 DOB: [birth year]  Age: 48 y.o. MRN#: [id number] Attending Physician: Carollee Herter, DO Primary Care Physician: Taylor Macho, MD Admit Date: 01/26/2024 Length of Stay: 2 days  Reason for Consultation/Follow-up: Establishing goals of care  Subjective:   CC: Patient noting pain is helped by oxycodone though still having right-sided pain after Pleurx placement.  Following up regarding complex medical decision making.  Subjective:  Reviewed EMR prior to presenting to bedside.  At time of EMR review in past 24 hours patient has received as needed oxycodone 5 mg x 3 doses and as needed IV morphine 1 mg x 1 dose.  Reviewed EMR including oncology note.  Presented to bedside in the a.m. to visit with patient though chaplain currently visiting with patient's and noted will return later in the day.  When returning later in the day, patient laying in bed without family initially present at bedside.  Very shortly into conversation though patient's sister, Taylor Flynn, joined patient at bedside.  Specifically asked patient's permission to discuss care moving forward.  Patient agreeing with this at this time.  Discussed patient's protein, particularly prior albumin being low at 8.  Expressed this combined with patient's ECOG status would alert for concern about further cancer directed therapy is hurting her over helping her.  Patient became very emotional during conversation.  Patient continued to state "I need more time" and "I am not ready to die".  Spent time providing emotional support via active listening.  Normalized feelings of anxiety with these discussions.  After time for support, patient acknowledged hearing that cancer therapies would hurt her and not help her.  Patient has seen this happen in other family members. Discussed plan is for patient to go home with hospice support to support quality time at home with family.  Patient and  sister agreeing with this plan.  Discussed patient's symptom management at this time.  Patient feels the oxycodone 5 mg dose does help with her pain management.  This will continue with discharge.  Hospice could also assist with further adjustments and management.  Patient and sister agree with this plan.  Again with permission, asked if patient wanted to discuss ACP planning.  Patient agreeing that she does.  Reviewed documentation provided to chaplain.  Patient again states she wants her sister, Taylor Flynn, to be her healthcare power of attorney.  We discussed father's actions of ACP documentation as well.  Specifically related to her CODE STATUS, patient acknowledged that she heard that CPR and intubation would not benefit her.  Acknowledges expressed concern that should patient be sick enough that her heart were to stop or she were to stop breathing, interventions such as cardiac resuscitation and mechanical ventilation would not lead to quality of life in terms of outcomes and would not fix primary concern patient to be medically recommended DNR/DNI would also recommend patient to complete ACP documentation section discussing special instructions to specific time limit.  Spent time explaining that should patient be full code and ended up intubated, sister would enact HCPOA to be the one to decide to terminate life support.  Sister and patient acknowledged this would be difficult noting importance of discussion.  Sister asking if chaplain can come by to assist with completion today.  Noted would reach out to determine if this can be accomplished.  Patient's sister at bedside voicing concerns about patient discharging today.  Patient has equipment at home, sister  noted that she needs a little bit more time to get things set up for patient.  Patient supporting going home tomorrow morning.  Discussed with inform hospice liaison as well as would recommend patient be admitted for same-day discharge so that she  has hospice resources and has not an acute issues arise.  Noted would inquire with team about this.  Spent time answering questions as able.  Noted palliative medicine team continue to follow along with patient's medical journey.  Discussed care with IDT after visit including hospitalist, RN, PCCM, TOC, [facility], chaplain, and oncology.  Objective:   Vital Signs:  BP 110/74 (BP Location: Left Arm)   Pulse 90   Temp 97.6 F (36.4 C) (Oral)   Resp 18   Ht 5\' 4"  (1.626 m)   Wt 65.9 kg   LMP 12/07/2018   SpO2 97%   BMI 24.94 kg/m   Physical Exam: General: NAD, alert, cachectic, frail, chronically ill-appearing Cardiovascular: Tachycardia noted Respiratory: no increased work of breathing noted, not in respiratory distress Abdomen: distended Neuro: A&Ox4, following commands easily Psych: Anxious  Imaging: I personally reviewed recent imaging.   Assessment & Plan:   Assessment: Patient is a 48 year old female with a past medical history of metastatic breast cancer who presented to ED on 01/26/2024 for management of worsening shortness of breath with pleuritic chest pain.  Patient had recently moved from [place], was receiving care there, to [place] for more family support.  Patient has been receiving chemotherapy and plans to follow-up with Dr. Myna Hidalgo in the outpatient setting.  Upon admission, patient noted to have acute respiratory failure with hypoxia secondary to bilateral pleural infusions seen on imaging.  Imaging also revealed metastatic disease to lung and liver, ascites noted, and left hydronephrosis..  Palliative medicine team consulted to assist with complex medical decision making.   Recommendations/Plan: # Complex medical decision making/goals of care:    -Discussed care with patient while sister at bedside as detailed above in HPI.  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  Spent time discussing with patient that due to her  current nutritional and functional status, concern for further cancer directed therapies would hurt instead of help.  Patient acknowledges.  Patient is supposed to be going home with hospice.  Noted importance of creating memorable time at home so she is loves.                 -Discussed ACP documentation as detailed above in HPI with patient.  Patient wants to name her sister, Taylor Flynn, as her HCPOA.  Discussed importance of any special precautions patient may have regarding her health care.  Discussed concern that should patient be sick enough her heart were to stop or she were to stop breathing, interventions such as cardiac and respiratory resuscitation would not lead to good quality of life outcomes.  Medically recommend CODE STATUS of DNR/DNI.  Discussed that if patient is wishes to continue with full CODE STATUS, recommended special instructions about duration.  Short time since sister would ultimately have to be the one to decide to terminate these forms of life support as earlier prior to returning.  They asked to speak with chaplain for ACP completion and plan to discuss CODE STATUS further.                -  Code Status: Full Code    # Symptom management Patient is receiving these palliative interventions for symptom management with an  intent to improve quality of life.                 -Pain, in setting of metastatic breast cancer   -Continue oxycodone 2.5-5 mg every 4 hours as needed   -Continue IV morphine 1 mg every 3 hours as needed breakthrough pain                 -Shortness of breath                               -Seen by PCCM and received bilateral Pleurx today.  Notes shortness of breath improved.                 -Anxiety                               -Continue Ativan tab 0.5 mg twice daily as needed  # Psycho-social/Spiritual Support:  - Support System: Siblings, children, mother                -Have involved chaplain to assist with completion of ACP documentation spiritual  support   # Discharge Planning: Home with hospice via [facility] [place]  Discussed with: Patient, patient's sister, hospitalist, oncologist, RN, [facility] the [facility], [facility], chaplain  Thank you for allowing the palliative care team to participate in the care Virgina Norfolk.  Alvester Morin, DO Palliative Care Provider PMT # [phone]  If patient remains symptomatic despite maximum doses, please call PMT at [phone] between 0700 and 1900. Outside of these hours, please call attending, as PMT does not have night coverage.  Personally spent 55 minutes in patient care including extensive chart review (labs, imaging, progress/consult notes, vital signs), medically appropraite exam, discussed with treatment team, education to patient, family, and staff, documenting clinical information, medication review and management, coordination of care, and available advanced directive documents.

## 2024-01-29 NOTE — Plan of Care (Signed)

## 2024-01-29 NOTE — Progress Notes (Deleted)
   01/29/24 1520  TOC Brief Assessment  Insurance and Status Reviewed  Patient has primary care physician No (followed by Dr. Arlan Organ oncology)  Home environment has been reviewed Home with children  Prior level of function: Independent  Prior/Current Home Services No current home services ([facility] Hosp[facility] referral with [facility] the [place])  Social Drivers of Health Review SDOH reviewed no interventions necessary  Readmission risk has been reviewed Yes  Transition of care needs no transition of care needs at this time

## 2024-01-29 NOTE — Telephone Encounter (Signed)
 Dr. Vassie Loll- No HFU's avail at any practice until May. Dr. Larinda Buttery in [place] has a lot of "Lung Nod" spots open on 4/9 but I am not sure yet if the PT is willing to go to [place] and I can no overide these spots. . Please advise, sir.

## 2024-01-29 NOTE — Progress Notes (Signed)
   01/29/24 1520  TOC Brief Assessment  Insurance and Status Reviewed  Patient has primary care physician No (followed by Dr. Arlan Organ oncology)  Home environment has been reviewed Home with children  Prior level of function: Independent  Prior/Current Home Services No current home services (New Hospice referral with [facility] the [place])  Social Drivers of Health Review SDOH reviewed no interventions necessary  Readmission risk has been reviewed Yes  Transition of care needs no transition of care needs at this time

## 2024-01-29 NOTE — Progress Notes (Addendum)
   01/29/24 1108  Spiritual Encounters  Type of Visit Follow up  Care provided to: Patient  Referral source Chaplain assessment;Physician  Reason for visit Routine spiritual support  Spiritual Framework  Presenting Themes Goals in life/care;Meaning/purpose/sources of inspiration   Returned for follow up support for Virgina Norfolk. Able to see patient one-on-one but did meet sister, [name] who is HCPOA and whose home patient will reside at d/c.  Dr. Virgina Norfolk processed with me a conversation from yesterday with myself and her mother. She engaged in some life review and named her current needs and goals. She also expressed role of faith in her meaning making and sense ofd hope.  I provided compassionate presence and engaged in active and reflective listening. I affirmed Nusaybah's needs and goals, offering normalization of emotions and holding space for her to process. I explored her goals for care and living and understand that Fancy agrees with her current HCPOA to name [name] as Management consultant and plan to d/c home with her. I offered prayer with patient consent.  Ongoing support remains available as needed. Zamara Cozad L. Sophronia Simas, M.Div [phone]

## 2024-01-29 NOTE — Progress Notes (Signed)
   01/29/24 1517  Spiritual Encounters  Type of Visit Follow up  Care provided to: Pt and family  Referral source Physician  Reason for visit Advance directives   Per referral from Alvester Morin, DO, I returned to offer support. I met briefly with [name] who was quite groggy at this time. I spoke also with her sister, [name]. I brought an advanced directive form and got [name] to provide her current contact info. [name] is too groggy at this time to authorize. I will leave form with Chaplain Dyanne Carrel who understands goals and plan to d/c. I offered compassionate presence and support to [name] and encouraged her self-care.  Lakayla Barrington L. Sophronia Simas, M.Div [phone]

## 2024-01-29 NOTE — Progress Notes (Signed)
   Apparently pt's sister(caregiver) not comfortable taking patient home today and wants to take patient home tomorrow. Hospice to admit patient tomorrow. Medically stable for discharge today.  Carollee Herter, DO [facility]

## 2024-01-30 ENCOUNTER — Other Ambulatory Visit: Payer: Self-pay

## 2024-01-30 ENCOUNTER — Encounter: Payer: Self-pay | Admitting: Hematology & Oncology

## 2024-01-30 ENCOUNTER — Encounter: Payer: Self-pay | Admitting: Family

## 2024-01-30 ENCOUNTER — Other Ambulatory Visit (HOSPITAL_COMMUNITY): Payer: Self-pay

## 2024-01-30 DIAGNOSIS — J90 Pleural effusion, not elsewhere classified: Secondary | ICD-10-CM | POA: Diagnosis not present

## 2024-01-30 DIAGNOSIS — C787 Secondary malignant neoplasm of liver and intrahepatic bile duct: Secondary | ICD-10-CM | POA: Diagnosis not present

## 2024-01-30 DIAGNOSIS — R180 Malignant ascites: Secondary | ICD-10-CM | POA: Diagnosis not present

## 2024-01-30 DIAGNOSIS — J9601 Acute respiratory failure with hypoxia: Secondary | ICD-10-CM | POA: Diagnosis not present

## 2024-01-30 DIAGNOSIS — C7931 Secondary malignant neoplasm of brain: Secondary | ICD-10-CM | POA: Diagnosis not present

## 2024-01-30 DIAGNOSIS — Z66 Do not resuscitate: Secondary | ICD-10-CM

## 2024-01-30 DIAGNOSIS — C50912 Malignant neoplasm of unspecified site of left female breast: Secondary | ICD-10-CM | POA: Diagnosis not present

## 2024-01-30 DIAGNOSIS — C50919 Malignant neoplasm of unspecified site of unspecified female breast: Secondary | ICD-10-CM | POA: Diagnosis not present

## 2024-01-30 MED ORDER — PANCRELIPASE (LIP-PROT-AMYL) 24000-76000 UNITS PO CPEP
24000.0000 [IU] | ORAL_CAPSULE | Freq: Three times a day (TID) | ORAL | 0 refills | Status: AC
  Filled 2024-01-30: qty 100, 34d supply, fill #0

## 2024-01-30 NOTE — Progress Notes (Signed)
 Discharge meds delivered to bedside [facility]

## 2024-01-30 NOTE — Discharge Summary (Signed)
 [facility] Phys[facility] Discharge Summary   Patient name: Taylor Flynn  Admit date:     01/26/2024  Discharge date: 01/30/2024  Attending Physician: Alba Cory [[id number]]  Discharge Physician: Carollee Herter   PCP: Josph Macho, MD  Admitted From: Home Disposition:  Home with hospice  Recommendations for Outpatient Follow-up:  Follow up with PCP in 1-2 weeks Follow up with oncology on 02-02-2024  Home Health:No Equipment/Devices: Bilateral PleurX catheters  Discharge Condition:Hospice CODE STATUS:DNR/DNI Diet recommendation: Regular Fluid Restriction: None  Hospital Summary: HPI: Taylor Flynn is a 48 y.o. female with history of metastatic breast cancer recently moved from [place] was admitted 3 weeks ago at [facility] for pleural effusion requiring chest tube placement and also was admitted again for abdominal discomfort with fluid but eventually patient decided to come to [place] to live with her family presents to the ER because of increasing shortness of breath with pleuritic chest pain since last night.  Denies any productive cough fever or chills.  Has noticed increased swelling in the right upper extremity and abdomen.  Patient is presently on trastuzumab and chemotherapy.  Patient previously used to follow with Dr. Arlan Organ oncologist and has appointment with Dr. Myna Hidalgo in the coming weeks.   ED Course: In the ER patient was requiring 3 L oxygen to maintain sats and has bilateral pleural effusion.  Given the pleuritic chest pain CT angiogram of the chest has been ordered.  Results are pending.  Labs show BNP of 47 lactic acid 0.9 hemoglobin 8 which is at baseline when compared to recent past.  LFTs elevated likely from metastasis.  Significant Events: Admitted 01/26/2024 for acute respiratory failure with hypoxia with bilateral pleural effusions   Significant Labs: Na 133, K 3.6, CO2 of 22, BUN 20, Scr 0.84, glu 60 WBC 6.1, HgB 8, plt  838  Significant Imaging Studies: CXR Diffuse airspace disease in the lungs bilaterally, possible edema or infiltrate. 2. Moderate right pleural effusion and small left pleural effusion. CTPA Negative for pulmonary embolism. 2. Widespread pulmonary and hepatic metastatic disease. Patchy bilateral pulmonary opacification could be confluent metastases or pneumonia. 3. Ascites and large bilateral pleural effusion. 4. Left hydronephrosis. Abd U/S Cirrhotic liver configuration. No discrete focal liver lesion seen on this limited exam. Mild to moderate ascites. No splenomegaly. 2. Mild fullness in the right renal collecting system. Mild-to-moderate left hydronephrosis. 3. Partially contracted gallbladder with sludge. No sonographic evidence of acute cholecystitis.  Antibiotic Therapy: Anti-infectives (From admission, onward)    Start     Dose/Rate Route Frequency Ordered Stop   01/27/24 2200  vancomycin (VANCOREADY) IVPB 750 mg/150 mL  Status:  Discontinued        750 mg 150 mL/hr over 60 Minutes Intravenous Every 12 hours 01/27/24 0951 01/27/24 1402   01/27/24 1700  ceFEPIme (MAXIPIME) 2 g in sodium chloride 0.9 % 100 mL IVPB        2 g 200 mL/hr over 30 Minutes Intravenous Every 8 hours 01/27/24 0953     01/27/24 0800  vancomycin (VANCOREADY) IVPB 1500 mg/300 mL        1,500 mg 150 mL/hr over 120 Minutes Intravenous  Once 01/27/24 0749 01/27/24 1254   01/27/24 0715  vancomycin (VANCOCIN) IVPB 1000 mg/200 mL premix  Status:  Discontinued        1,000 mg 200 mL/hr over 60 Minutes Intravenous  Once 01/27/24 0700 01/27/24 0749   01/27/24 0715  ceFEPIme (MAXIPIME) 2 g in sodium chloride 0.9 %  100 mL IVPB        2 g 200 mL/hr over 30 Minutes Intravenous  Once 01/27/24 0700 01/27/24 0254       Procedures: Bilateral PleurX catheter placement by PCCM  Consultants: Palliative care [facility] Course by Problem: * Acute hypoxic respiratory failure (HCC) 01-28-2024 due to  malignant pleural effusions. Will order home O2.  01-29-2024 pt weaned to RA while at rest but feels she need supplemental O2 when she is moving around.  01-30-2024 home O2 arranged  Abdominal ascites 01-28-2024 bedside abd U/S shows only minimal abd ascites. She has profound hepatomegaly with her liver edge at the level of her umbilicus. Liver appearance on U/S consistent with liver mets. Amount of ascites is not very large. Risks of paracentesis including bowel perforation, etc, are GREATER than any potential benefits of paracentesis. Pt agrees. No indication for paracentesis at this time.  Pressure injury of skin Present on admission.  Pressure Injury 01/27/24 Buttocks Right;Upper Stage 2 -  Partial thickness loss of dermis presenting as a shallow open injury with a red, pink wound bed without slough. (Active)  01/27/24   Location: Buttocks  Location Orientation: Right;Upper  Staging: Stage 2 -  Partial thickness loss of dermis presenting as a shallow open injury with a red, pink wound bed without slough.  Wound Description (Comments):   Present on Admission: Yes        S/P chest tube placement [facility]) - bilateral PleurX catheter placed 01-28-2024 by PCCM. 01-28-2024 pt will need drainage supplies for home use. Prn IV morphine.  01-29-2024 DC to home with prn 5 mg oxycodone. Pt advised to take laxative if using oxycodone on a regular basis. Advised her not to use [place] if she is going to use oxycodone to avoid precipitated withdrawal.  Metastasis from breast cancer (HCC) 01-28-2024 multiple mets. Followed by oncology.  01-30-2024 has been made DNR by oncology today. No plans for palliative chemo due to poor nutritional status and poor performance score. Pt going home with hospice today.   Bilateral pleural effusion 01-28-2024 presumed to be malignant.  Breast cancer metastasized to liver, unspecified laterality (HCC) 01-28-2024 pt gets insurance through [place]. [facility] and palliative chemo. CM attempting to find hospice agency that will accept pt as a client with concurrent care(hospice + palliative chemo).  01-29-2024 DC to home today with po oxycodone prn and po ativan prn. De-access port-a-cath. Recommend pt consume at least 1-1.5 g protein per kilo of body weight.  01-30-2024 has been made DNR by oncology today. No plans for palliative chemo due to poor nutritional status and poor performance score. Pt going home with hospice today.  Shortness of breath-resolved as of 01/29/2024 01-28-2024 due to bilateral pleural effusions.  01-29-2024 improved after drainage of her bilateral effusions.  Elevated LFTs 01-28-2024 likely due to liver mets  Edema of right upper extremity 01-28-2024 no DVT on UE U/S.  Iron deficiency anemia 01-28-2024 may need outpatient IV iron. No role for PRBC transfusion at this time. Pt is not hypotensive.  01-29-2024 f/u with oncology in office in 02-02-2024. Oncology can decide if pt needs IV iron.  DNR (do not resuscitate) discussion 01-30-2024 made DNR by oncology today. I completed a DNR form for pt to take home.    Discharge Diagnoses:  Principal Problem:   Acute hypoxic respiratory failure (HCC) Active Problems:   Breast cancer metastasized to liver, unspecified laterality (HCC)   Bilateral pleural  effusion   Metastasis from breast cancer [facility])   S/P chest tube placement [facility]) - bilateral PleurX catheter placed 01-28-2024 by PCCM.   Pressure injury of skin   Abdominal ascites   Iron deficiency anemia   Edema of right upper extremity   Elevated LFTs   Need for emotional support   Counseling and coordination of care   Palliative care encounter   Pain   DNR (do not resuscitate) discussion   Discharge Instructions  Discharge Instructions     Call MD for:  extreme fatigue   Complete by: As directed    Call MD for:  persistant dizziness or light-headedness   Complete by: As directed     Call MD for:  persistant nausea and vomiting   Complete by: As directed    Call MD for:  severe uncontrolled pain   Complete by: As directed    Call MD for:  temperature >100.4   Complete by: As directed    Diet - low sodium heart healthy   Complete by: As directed    Discharge instructions   Complete by: As directed    1. Follow up with your primary care provider in 1-2 weeks following discharge from hospital. 2. Follow up with outpatient hospice care. 3. Follow up with oncology. 4. Drain your PleurX catheters(right or left side) only if you feel short of breath. Do not routinely drain each catheter if not needed.   Discharge wound care:   Complete by: As directed    Pressure Injury 01/27/24 Buttocks Right;Upper Stage 2 -  Partial thickness loss of dermis presenting as a shallow open injury with a red, pink wound bed without slough.  Foam dressing to sacrum/buttocks, change Q 3 days or PRN soiling.  If frequently stooling, then discontinue use of sacral foam dressing and use the square foam.   Increase activity slowly   Complete by: As directed       Allergies as of 01/30/2024       Reactions   Aspirin Swelling   Angioedema    Sulfa Antibiotics Rash        Medication List     PAUSE taking these medications    Buprenorphine HCl 75 MCG Film Wait to take this until your doctor or other care provider tells you to start again. Place 1 Film inside cheek 2 (two) times daily as needed (For PAin).       STOP taking these medications    MELATONIN PO   NALTREXONE HCL PO   NON FORMULARY   OLANZapine 2.5 MG tablet Commonly known as: ZYPREXA   OVER THE COUNTER MEDICATION   VITAMIN C PO   Vitamin D 125 MCG (5000 UT) Caps   VITAMIN K2 PO       TAKE these medications    acetaminophen 500 MG tablet Commonly known as: TYLENOL Take 500 mg by mouth every 6 (six) hours as needed for mild pain (pain score 1-3).   Advil 200 MG tablet Generic drug: ibuprofen Take  200 mg by mouth every 6 (six) hours as needed for mild pain (pain score 1-3) or moderate pain (pain score 4-6).   dexamethasone 4 MG tablet Commonly known as: DECADRON Take 4 mg by mouth as directed.  Take 1 tablet (4 mg total) by mouth 2 times a day with breakfast & dinner for three days starting the day after outpatient clinic chemotherapy infusion   LORazepam 0.5 MG tablet Commonly known as: ATIVAN Take 1 tablet (0.5 mg  total) by mouth at bedtime as needed for anxiety or sleep.   metroNIDAZOLE 1 % gel Commonly known as: METROGEL Apply 1 Application topically daily.   oxyCODONE 5 MG immediate release tablet Commonly known as: Oxy IR/ROXICODONE Take 1 tablet (5 mg total) by mouth every 4 (four) hours as needed for up to 7 days for moderate pain (pain score 4-6) or severe pain (pain score 7-10).   Pancrelipase (Lip-Prot-Amyl) 24000-76000 units Cpep Take 1 capsule (24,000 Units total) by mouth 3 (three) times daily before meals.   PRESCRIPTION MEDICATION Inject 1 mL into the skin every Monday, Wednesday, and Friday. mistletoe   prochlorperazine 10 MG tablet Commonly known as: COMPAZINE Take 10 mg by mouth every 6 (six) hours as needed for nausea or vomiting.   senna 8.6 MG tablet Commonly known as: SENOKOT Take 1 tablet by mouth 2 (two) times daily.   TURMERIC CURCUMIN PO Take 1 tablet by mouth daily at 6 (six) AM.   ZINC PO Take 1 tablet by mouth daily at 6 (six) AM.               Durable Medical Equipment  (From admission, onward)           Start     Ordered   01/28/24 1703  For home use only DME oxygen  Once       Question Answer Comment  Length of Need Lifetime   Mode or (Route) Nasal cannula   Liters per Minute 2   Frequency Continuous (stationary and portable oxygen unit needed)   Oxygen conserving device Yes   Oxygen delivery system Gas      01/28/24 1702              Discharge Care Instructions  (From admission, onward)            Start     Ordered   01/29/24 0000  Discharge wound care:       Comments: Pressure Injury 01/27/24 Buttocks Right;Upper Stage 2 -  Partial thickness loss of dermis presenting as a shallow open injury with a red, pink wound bed without slough.  Foam dressing to sacrum/buttocks, change Q 3 days or PRN soiling.  If frequently stooling, then discontinue use of sacral foam dressing and use the square foam.   01/29/24 1359            Allergies  Allergen Reactions   Aspirin Swelling    Angioedema    Sulfa Antibiotics Rash    Discharge Exam: Vitals:   01/29/24 2051 01/30/24 0637  BP: 119/83 113/82  Pulse: (!) 109 (!) 107  Resp: 16 16  Temp: 98.8 F (37.1 C) 98.6 F (37 C)  SpO2: 96% 98%    Physical Exam Vitals and nursing note reviewed.  Constitutional:      General: She is not in acute distress.    Appearance: She is not toxic-appearing or diaphoretic.     Comments: Appears chronically ill, frail and cachetic. Awake and alert.  HENT:     Head: Normocephalic and atraumatic.  Cardiovascular:     Rate and Rhythm: Regular rhythm. Tachycardia present.  Pulmonary:     Effort: Pulmonary effort is normal. No respiratory distress.  Abdominal:     General: There is distension.     Tenderness: There is no abdominal tenderness.  Skin:    Capillary Refill: Capillary refill takes less than 2 seconds.  Neurological:     Mental Status: She is alert and oriented to  person, place, and time.     The results of significant diagnostics from this hospitalization (including imaging, microbiology, ancillary and laboratory) are listed below for reference.    Microbiology: Recent Results (from the past 240 hours)  Resp panel by RT-PCR (RSV, Flu A&B, Covid) Anterior Nasal Swab     Status: None   Collection Time: 01/26/24 10:39 PM   Specimen: Anterior Nasal Swab  Result Value Ref Range Status   SARS Coronavirus 2 by RT PCR NEGATIVE NEGATIVE Final    Comment: (NOTE) SARS-CoV-2 target  nucleic acids are NOT DETECTED.  The SARS-CoV-2 RNA is generally detectable in upper respiratory specimens during the acute phase of infection. The lowest concentration of SARS-CoV-2 viral copies this assay can detect is 138 copies/mL. A negative result does not preclude SARS-Cov-2 infection and should not be used as the sole basis for treatment or other patient management decisions. A negative result may occur with  improper specimen collection/handling, submission of specimen other than nasopharyngeal swab, presence of viral mutation(s) within the areas targeted by this assay, and inadequate number of viral copies(<138 copies/mL). A negative result must be combined with clinical observations, patient history, and epidemiological information. The expected result is Negative.  Fact Sheet for Patients:  BloggerCourse.com  Fact Sheet for Healthcare Providers:  SeriousBroker.it  This test is no t yet approved or cleared by the [place] FDA and  has been authorized for detection and/or diagnosis of SARS-CoV-2 by FDA under an Emergency Use Authorization (EUA). This EUA will remain  in effect (meaning this test can be used) for the duration of the COVID-19 declaration under Section 564(b)(1) of the Act, 21 U.S.C.section 360bbb-3(b)(1), unless the authorization is terminated  or revoked sooner.       Influenza A by PCR NEGATIVE NEGATIVE Final   Influenza B by PCR NEGATIVE NEGATIVE Final    Comment: (NOTE) The Xpert Xpress SARS-CoV-2/FLU/RSV plus assay is intended as an aid in the diagnosis of influenza from Nasopharyngeal swab specimens and should not be used as a sole basis for treatment. Nasal washings and aspirates are unacceptable for Xpert Xpress SARS-CoV-2/FLU/RSV testing.  Fact Sheet for Patients: BloggerCourse.com  Fact Sheet for Healthcare  Providers: SeriousBroker.it  This test is not yet approved or cleared by the [place] FDA and has been authorized for detection and/or diagnosis of SARS-CoV-2 by FDA under an Emergency Use Authorization (EUA). This EUA will remain in effect (meaning this test can be used) for the duration of the COVID-19 declaration under Section 564(b)(1) of the Act, 21 U.S.C. section 360bbb-3(b)(1), unless the authorization is terminated or revoked.     Resp Syncytial Virus by PCR NEGATIVE NEGATIVE Final    Comment: (NOTE) Fact Sheet for Patients: BloggerCourse.com  Fact Sheet for Healthcare Providers: SeriousBroker.it  This test is not yet approved or cleared by the [place] FDA and has been authorized for detection and/or diagnosis of SARS-CoV-2 by FDA under an Emergency Use Authorization (EUA). This EUA will remain in effect (meaning this test can be used) for the duration of the COVID-19 declaration under Section 564(b)(1) of the Act, 21 U.S.C. section 360bbb-3(b)(1), unless the authorization is terminated or revoked.  Performed at [facility], [address], [place], [place] [address]   Blood culture (routine x 2)     Status: None (Preliminary result)   Collection Time: 01/26/24 10:39 PM   Specimen: BLOOD  Result Value Ref Range Status   Specimen Description   Final    BLOOD  PORTA CATH Performed at [facility], [address], [place], [place] [address]    Special Requests   Final    BOTTLES DRAWN AEROBIC AND ANAEROBIC Blood Culture results may not be optimal due to an inadequate volume of blood received in culture bottles Performed at [facility], [address], [place], [place] [address]    Culture   Final    NO GROWTH 4 DAYS Performed at [facility], [address] [place], [place] [address]    Report Status PENDING   Incomplete  MRSA Next Gen by PCR, Nasal     Status: None   Collection Time: 01/27/24  9:27 AM   Specimen: Nasal Mucosa; Nasal Swab  Result Value Ref Range Status   MRSA by PCR Next Gen NOT DETECTED NOT DETECTED Final    Comment: (NOTE) The GeneXpert MRSA Assay (FDA approved for NASAL specimens only), is one component of a comprehensive MRSA colonization surveillance program. It is not intended to diagnose MRSA infection nor to guide or monitor treatment for MRSA infections. Test performance is not FDA approved in patients less than 58 years old. Performed at [facility], [address], [place], [place] [address]      Labs: BNP (last 3 results) Recent Labs    01/26/24 2239  BNP 47.0   Basic Metabolic Panel: Recent Labs  Lab 01/26/24 2239 01/26/24 2300 01/27/24 0654 01/28/24 0500 01/29/24 0617  NA 133* 133* 133* 135 134*  K 3.6 3.6 3.7 3.6 3.8  CL 100 100 100 103 103  CO2 22  --  24 25 24   GLUCOSE 60* 57* 62* 80 59*  BUN 20 17 17 20  23*  CREATININE 0.84 0.90 0.71 0.89 0.79  CALCIUM 8.1*  --  7.8* 7.9* 8.0*   Lab Results  Component Value Date/Time   PREALBUMIN 8 (L) 01/28/2024 09:06 AM   Lab Results  Component Value Date/Time   CA2729 145.1 (H) 01/28/2024 09:07 AM   Liver Function Tests: Recent Labs  Lab 01/26/24 2239 01/27/24 0654 01/28/24 0500 01/29/24 0617  AST 152* 140* 135* 126*  ALT 72* 66* 67* 62*  ALKPHOS 575* 545* 507* 463*  BILITOT 0.9 0.7 0.5 0.5  PROT 4.9* 4.6* 4.7* 4.5*  ALBUMIN 2.0* 2.0* 2.0* 1.8*   CBC: Recent Labs  Lab 01/26/24 2239 01/26/24 2300 01/27/24 0654 01/28/24 0500 01/29/24 0617  WBC 6.1  --  4.9 4.7 3.7*  NEUTROABS 4.3  --  3.2  --  2.2  HGB 8.0* 8.8* 7.2* 7.3* 7.0*  HCT 26.7* 26.0* 24.0* 23.2* 24.4*  MCV 80.4  --  81.1 79.5* 84.1  PLT 838*  --  763* 770* 833*    BNP: Recent Labs  Lab 01/26/24 2239  BNP 47.0   Anemia work up No results for input(s): "VITAMINB12", "FOLATE", "FERRITIN",  "TIBC", "IRON", "RETICCTPCT" in the last 72 hours.  Sepsis Labs Recent Labs  Lab 01/26/24 2239 01/27/24 0654 01/28/24 0500 01/29/24 0617  WBC 6.1 4.9 4.7 3.7*    Procedures/Studies: DG CHEST PORT 1 VIEW Result Date: 01/28/2024 CLINICAL DATA:  Status post left-sided chest tube placement. EXAM: PORTABLE CHEST 1 VIEW COMPARISON:  January 26, 2024. FINDINGS: The heart size and mediastinal contours are within normal limits. Right internal jugular Port-A-Cath is unchanged. Stable bilateral lung opacities are noted concerning for pneumonia or edema. There is been interval placement bilateral chest tubes. Right pleural effusion is slightly decreased compared to prior exam. Small left pleural  effusion remains. Small bilateral apical pneumothoraces are noted. The visualized skeletal structures are unremarkable. IMPRESSION: Interval placement of bilateral chest tubes with small bilateral apical pneumothoraces noted. Right pleural effusion is slightly decreased compared to prior exam. Grossly stable small left pleural effusion is noted. Stable bilateral lung opacities as noted above. Electronically Signed   By: Lupita Raider M.D.   On: 01/28/2024 18:53   MR BRAIN W WO CONTRAST Result Date: 01/28/2024 CLINICAL DATA:  Breast cancer, invasive, stage IV. EXAM: MRI HEAD WITHOUT AND WITH CONTRAST TECHNIQUE: Multiplanar, multiecho pulse sequences of the brain and surrounding structures were obtained without and with intravenous contrast. CONTRAST:  6mL GADAVIST GADOBUTROL 1 MMOL/ML IV SOLN COMPARISON:  11/20/2021.  05/18/2021. FINDINGS: Brain: No evidence residual or new metastatic disease. The only imaging residua of the previously treated disease are small foci of encephalomalacia at the posterior right cerebellum and the lateral left cerebellum. No evidence of residual enhancing tissue. No evidence of stroke, hemorrhage, hydrocephalus or extra-axial collection. No other abnormal brain or leptomeningeal enhancement  occurs. Vascular: Major vessels at the base of the brain show flow. Skull and upper cervical spine: Negative Sinuses/Orbits: Clear/normal Other: None IMPRESSION: No evidence of residual or new metastatic disease. Small foci of encephalomalacia at the posterior right cerebellum and the lateral left cerebellum, sequela of previously treated lesions. Electronically Signed   By: Paulina Fusi M.D.   On: 01/28/2024 10:54   VAS [place] UPPER EXTREMITY VENOUS DUPLEX Result Date: 01/27/2024 UPPER VENOUS STUDY  Patient Name:  VIANNA VENEZIA  Date of Exam:   01/27/2024 Medical Rec #: [id number]     Accession #:    [id number] Date of Birth: 1976/10/30      Patient Gender: F Patient Age:   69 years [facility] Procedure:      VAS [place] UPPER EXTREMITY VENOUS DUPLEX Referring Phys: Midge Minium --------------------------------------------------------------------------------  Indications: Edema Risk Factors: Cancer. Limitations: Line and Right subclavian port. Comparison Study: No prior studies. Performing Technologist: Chanda Busing RVT  Examination Guidelines: A complete evaluation includes B-mode imaging, spectral Doppler, color Doppler, and power Doppler as needed of all accessible portions of each vessel. Bilateral testing is considered an integral part of a complete examination. Limited examinations for reoccurring indications may be performed as noted.  Right Findings: +----------+------------+---------+-----------+----------+--------------+ RIGHT     CompressiblePhasicitySpontaneousProperties   Summary     +----------+------------+---------+-----------+----------+--------------+ IJV           Full       Yes       Yes                             +----------+------------+---------+-----------+----------+--------------+ Subclavian                                          Not visualized +----------+------------+---------+-----------+----------+--------------+ Axillary      Full        Yes       Yes                             +----------+------------+---------+-----------+----------+--------------+ Brachial      Full                                                 +----------+------------+---------+-----------+----------+--------------+  Radial        Full                                                 +----------+------------+---------+-----------+----------+--------------+ Ulnar         Full                                                 +----------+------------+---------+-----------+----------+--------------+ Cephalic      Full                                                 +----------+------------+---------+-----------+----------+--------------+ Basilic       Full                                                 +----------+------------+---------+-----------+----------+--------------+  Left Findings: +----------+------------+---------+-----------+----------+-------+ LEFT      CompressiblePhasicitySpontaneousPropertiesSummary +----------+------------+---------+-----------+----------+-------+ Subclavian    Full       Yes       Yes                      +----------+------------+---------+-----------+----------+-------+  Summary:  Right: No evidence of deep vein thrombosis in the upper extremity. No evidence of superficial vein thrombosis in the upper extremity.  Left: No evidence of thrombosis in the subclavian.  *See table(s) above for measurements and observations.  Diagnosing physician: Carolynn Sayers Electronically signed by Carolynn Sayers on 01/27/2024 at 12:19:24 PM.    Final    US Abdomen Complete Result Date: 01/27/2024 CLINICAL DATA:  [id number] Abdominal pain [id number] EXAM: ABDOMEN ULTRASOUND COMPLETE COMPARISON:  None Available. FINDINGS: Gallbladder: The gallbladder is only partially visualized and appears partially contracted. Disease sludge within the gallbladder. Mild wall thickening in the visualized portion. The technologist noted  a negative Murphy sign. Common bile duct: Diameter: Up to 3 mm. Intrahepatic bile duct dilation. Liver: There is diffuse liver surface nodularity and heterogeneous echotexture/echogenicity, compatible with cirrhotic configuration. No discrete focal mass seen on this limited exam. Portal vein is patent on color Doppler imaging with normal direction of blood flow towards the liver. IVC: No abnormality in the visualized portion. Pancreas: No abnormality in the visualized portion. Spleen: Size and appearance within normal limits. Right Kidney: Length: 8.5 cm. Echogenicity within normal limits. There is mild fullness in the collecting system. No mass visualized. Left Kidney: Length: 9.6 cm. Echogenicity within normal limits. There is mild moderate hydronephrosis. No mass visualized. Abdominal aorta: No aneurysm visualized. Other findings: Note is made of mild to moderate ascites. IMPRESSION: 1. Cirrhotic liver configuration. No discrete focal liver lesion seen on this limited exam. Mild to moderate ascites. No splenomegaly. 2. Mild fullness in the right renal collecting system. Mild-to-moderate left hydronephrosis. 3. Partially contracted gallbladder with sludge. No sonographic evidence of acute cholecystitis. Electronically Signed   By: Jules Schick M.D.   On: 01/27/2024 10:32   CT Angio Chest Pulmonary Embolism (PE) W or WO Contrast Result Date: 01/27/2024 CLINICAL DATA:  Pulmonary embolism suspected, high probability. Recent thoracentesis. History of metastatic breast cancer. EXAM: CT ANGIOGRAPHY CHEST WITH CONTRAST TECHNIQUE: Multidetector CT imaging of the chest was performed using the standard protocol during bolus administration of intravenous contrast. Multiplanar CT image reconstructions and MIPs were obtained to evaluate the vascular anatomy. RADIATION DOSE REDUCTION: This exam was performed according to the departmental dose-optimization program which includes automated exposure control, adjustment of the  mA and/or kV according to patient size and/or use of iterative reconstruction technique. CONTRAST:  75mL OMNIPAQUE IOHEXOL 350 MG/ML SOLN COMPARISON:  03/21/2022 PET-CT. FINDINGS: Cardiovascular: Satisfactory opacification of the pulmonary arteries to the segmental level. No evidence of pulmonary embolism. Normal heart size. No pericardial effusion. Unremarkable positioning of right-sided porta catheter. Mediastinum/Nodes: Negative for mediastinal mass. Bilateral breast skin thickening and subcutaneous reticulation. Indeterminate reticulation at the bilateral axilla without discrete enlarged lymph node. Lungs/Pleura: Irregular nodules throughout the lungs which are numerous. Areas of more broad opacification in the lower lungs which could be confluent metastases or areas of pneumonia. Large bilateral pleural effusion. It is difficult to detect discrete pleural nodularity or thickening on this arterial study. Upper Abdomen: Widespread hepatic masses compatible with metastatic disease. The partially covered left kidney is hydronephrotic. Abdominal ascites which is moderate. Musculoskeletal: No acute finding Review of the MIP images confirms the above findings. IMPRESSION: 1. Negative for pulmonary embolism. 2. Widespread pulmonary and hepatic metastatic disease. Patchy bilateral pulmonary opacification could be confluent metastases or pneumonia. 3. Ascites and large bilateral pleural effusion. 4. Left hydronephrosis. Electronically Signed   By: Tiburcio Pea M.D.   On: 01/27/2024 05:55   DG Chest 2 View Result Date: 01/26/2024 CLINICAL DATA:  Shortness of breath, abdominal distention, and jaundice. Recent thoracentesis. EXAM: CHEST - 2 VIEW COMPARISON:  03/21/2022. FINDINGS: The heart size and mediastinal contours are within normal limits. A right chest port terminates over the superior vena cava. Diffuse patchy airspace disease is noted in the lungs bilaterally with a basilar predominance there is a moderate  right pleural effusion and a small left pleural effusion. No pneumothorax is seen. No acute osseous abnormality. IMPRESSION: 1. Diffuse airspace disease in the lungs bilaterally, possible edema or infiltrate. 2. Moderate right pleural effusion and small left pleural effusion. Electronically Signed   By: Thornell Sartorius M.D.   On: 01/26/2024 21:39    Time coordinating discharge: 45 mins  SIGNED:  Carollee Herter, DO [facility] 01/30/24, 9:26 AM

## 2024-01-30 NOTE — Telephone Encounter (Signed)
 Spoke with patient she will keep the 4/3 appointment

## 2024-01-30 NOTE — Progress Notes (Signed)
 PROGRESS NOTE    Taylor Flynn  WGN:[id number] DOB: 04-03-1976 DOA: 01/26/2024 PCP: Josph Macho, MD  Subjective: Pt seen and examined.  Oncology note reviewed. Pt has been made DNR by oncology. Ready for DC today.   Hospital Course: HPI: Taylor Flynn is a 48 y.o. female with history of metastatic breast cancer recently moved from [place] was admitted 3 weeks ago at [facility] for pleural effusion requiring chest tube placement and also was admitted again for abdominal discomfort with fluid but eventually patient decided to come to [place] to live with her family presents to the ER because of increasing shortness of breath with pleuritic chest pain since last night.  Denies any productive cough fever or chills.  Has noticed increased swelling in the right upper extremity and abdomen.  Patient is presently on trastuzumab and chemotherapy.  Patient previously used to follow with Dr. Arlan Organ oncologist and has appointment with Dr. Myna Hidalgo in the coming weeks.   ED Course: In the ER patient was requiring 3 L oxygen to maintain sats and has bilateral pleural effusion.  Given the pleuritic chest pain CT angiogram of the chest has been ordered.  Results are pending.  Labs show BNP of 47 lactic acid 0.9 hemoglobin 8 which is at baseline when compared to recent past.  LFTs elevated likely from metastasis.  Significant Events: Admitted 01/26/2024 for acute respiratory failure with hypoxia with bilateral pleural effusions   Significant Labs: Na 133, K 3.6, CO2 of 22, BUN 20, Scr 0.84, glu 60 WBC 6.1, HgB 8, plt 838  Significant Imaging Studies: CXR Diffuse airspace disease in the lungs bilaterally, possible edema or infiltrate. 2. Moderate right pleural effusion and small left pleural effusion. CTPA Negative for pulmonary embolism. 2. Widespread pulmonary and hepatic metastatic disease. Patchy bilateral pulmonary opacification could be confluent metastases or pneumonia. 3.  Ascites and large bilateral pleural effusion. 4. Left hydronephrosis. Abd U/S Cirrhotic liver configuration. No discrete focal liver lesion seen on this limited exam. Mild to moderate ascites. No splenomegaly. 2. Mild fullness in the right renal collecting system. Mild-to-moderate left hydronephrosis. 3. Partially contracted gallbladder with sludge. No sonographic evidence of acute cholecystitis.  Antibiotic Therapy: Anti-infectives (From admission, onward)    Start     Dose/Rate Route Frequency Ordered Stop   01/27/24 2200  vancomycin (VANCOREADY) IVPB 750 mg/150 mL  Status:  Discontinued        750 mg 150 mL/hr over 60 Minutes Intravenous Every 12 hours 01/27/24 0951 01/27/24 1402   01/27/24 1700  ceFEPIme (MAXIPIME) 2 g in sodium chloride 0.9 % 100 mL IVPB        2 g 200 mL/hr over 30 Minutes Intravenous Every 8 hours 01/27/24 0953     01/27/24 0800  vancomycin (VANCOREADY) IVPB 1500 mg/300 mL        1,500 mg 150 mL/hr over 120 Minutes Intravenous  Once 01/27/24 0749 01/27/24 1254   01/27/24 0715  vancomycin (VANCOCIN) IVPB 1000 mg/200 mL premix  Status:  Discontinued        1,000 mg 200 mL/hr over 60 Minutes Intravenous  Once 01/27/24 0700 01/27/24 0749   01/27/24 0715  ceFEPIme (MAXIPIME) 2 g in sodium chloride 0.9 % 100 mL IVPB        2 g 200 mL/hr over 30 Minutes Intravenous  Once 01/27/24 0700 01/27/24 7846       Procedures: Bilateral PleurX catheter placement by PCCM  Consultants: Palliative care PCCM oncology    Assessment  and Plan: * Acute hypoxic respiratory failure (HCC) 01-28-2024 due to malignant pleural effusions. Will order home O2.  01-29-2024 pt weaned to RA while at rest but feels she need supplemental O2 when she is moving around.  01-30-2024 home O2 arranged  Abdominal ascites 01-28-2024 bedside abd U/S shows only minimal abd ascites. She has profound hepatomegaly with her liver edge at the level of her umbilicus. Liver appearance on U/S consistent  with liver mets. Amount of ascites is not very large. Risks of paracentesis including bowel perforation, etc, are GREATER than any potential benefits of paracentesis. Pt agrees. No indication for paracentesis at this time.  Pressure injury of skin Present on admission.  Pressure Injury 01/27/24 Buttocks Right;Upper Stage 2 -  Partial thickness loss of dermis presenting as a shallow open injury with a red, pink wound bed without slough. (Active)  01/27/24   Location: Buttocks  Location Orientation: Right;Upper  Staging: Stage 2 -  Partial thickness loss of dermis presenting as a shallow open injury with a red, pink wound bed without slough.  Wound Description (Comments):   Present on Admission: Yes        S/P chest tube placement [facility]) - bilateral PleurX catheter placed 01-28-2024 by PCCM. 01-28-2024 pt will need drainage supplies for home use. Prn IV morphine.  01-29-2024 DC to home with prn 5 mg oxycodone. Pt advised to take laxative if using oxycodone on a regular basis. Advised her not to use [place] if she is going to use oxycodone to avoid precipitated withdrawal.  Metastasis from breast cancer (HCC) 01-28-2024 multiple mets. Followed by oncology.  01-30-2024 has been made DNR by oncology today. No plans for palliative chemo due to poor nutritional status and poor performance score. Pt going home with hospice today.   Bilateral pleural effusion 01-28-2024 presumed to be malignant.  Breast cancer metastasized to liver, unspecified laterality (HCC) 01-28-2024 pt gets insurance through [place]. [facility] and palliative chemo. CM attempting to find hospice agency that will accept pt as a client with concurrent care(hospice + palliative chemo).  01-29-2024 DC to home today with po oxycodone prn and po ativan prn. De-access port-a-cath. Recommend pt consume at least 1-1.5 g protein per kilo of body weight.  01-30-2024 has been made DNR by oncology today. No  plans for palliative chemo due to poor nutritional status and poor performance score. Pt going home with hospice today.  Shortness of breath-resolved as of 01/29/2024 01-28-2024 due to bilateral pleural effusions.  01-29-2024 improved after drainage of her bilateral effusions.  Elevated LFTs 01-28-2024 likely due to liver mets  Edema of right upper extremity 01-28-2024 no DVT on UE U/S.  Iron deficiency anemia 01-28-2024 may need outpatient IV iron. No role for PRBC transfusion at this time. Pt is not hypotensive.  01-29-2024 f/u with oncology in office in 02-02-2024. Oncology can decide if pt needs IV iron.  DNR (do not resuscitate) discussion 01-30-2024 made DNR by oncology today. I completed a DNR form for pt to take home.  DVT prophylaxis:     Code Status: Do not attempt resuscitation (DNR) - Comfort care Family Communication: no family at bedside. Pt is decisional. Disposition Plan: home with hospice Reason for continuing need for hospitalization: medically stable since yesterday for discharge.  Objective: Vitals:   01/29/24 0555 01/29/24 1324 01/29/24 2051 01/30/24 0637  BP: 110/74 121/82 119/83 113/82  Pulse: 90 91 (!) 109 (!) 107  Resp: 18 16 16 16   Temp:  97.6 F (36.4 C) (!) 97.3 F (36.3 C) 98.8 F (37.1 C) 98.6 F (37 C)  TempSrc: Oral Oral Oral Oral  SpO2: 97% 99% 96% 98%  Weight:      Height:        Intake/Output Summary (Last 24 hours) at 01/30/2024 0924 Last data filed at 01/30/2024 6578 Gross per 24 hour  Intake 240 ml  Output 351 ml  Net -111 ml   Filed Weights   01/27/24 0706  Weight: 65.9 kg    Examination:  Physical Exam Vitals and nursing note reviewed.  Constitutional:      General: She is not in acute distress.    Appearance: She is not toxic-appearing or diaphoretic.     Comments: Appears chronically ill, frail and cachetic. Awake and alert.  HENT:     Head: Normocephalic and atraumatic.  Cardiovascular:     Rate and Rhythm:  Regular rhythm. Tachycardia present.  Pulmonary:     Effort: Pulmonary effort is normal. No respiratory distress.  Abdominal:     General: There is distension.     Tenderness: There is no abdominal tenderness.  Skin:    Capillary Refill: Capillary refill takes less than 2 seconds.  Neurological:     Mental Status: She is alert and oriented to person, place, and time.     Data Reviewed: I have personally reviewed following labs and imaging studies  CBC: Recent Labs  Lab 01/26/24 2239 01/26/24 2300 01/27/24 0654 01/28/24 0500 01/29/24 0617  WBC 6.1  --  4.9 4.7 3.7*  NEUTROABS 4.3  --  3.2  --  2.2  HGB 8.0* 8.8* 7.2* 7.3* 7.0*  HCT 26.7* 26.0* 24.0* 23.2* 24.4*  MCV 80.4  --  81.1 79.5* 84.1  PLT 838*  --  763* 770* 833*   Basic Metabolic Panel: Recent Labs  Lab 01/26/24 2239 01/26/24 2300 01/27/24 0654 01/28/24 0500 01/29/24 0617  NA 133* 133* 133* 135 134*  K 3.6 3.6 3.7 3.6 3.8  CL 100 100 100 103 103  CO2 22  --  24 25 24   GLUCOSE 60* 57* 62* 80 59*  BUN 20 17 17 20  23*  CREATININE 0.84 0.90 0.71 0.89 0.79  CALCIUM 8.1*  --  7.8* 7.9* 8.0*   GFR: Estimated Creatinine Clearance: 80.4 mL/min (by C-G formula based on SCr of 0.79 mg/dL). Liver Function Tests: Recent Labs  Lab 01/26/24 2239 01/27/24 0654 01/28/24 0500 01/29/24 0617  AST 152* 140* 135* 126*  ALT 72* 66* 67* 62*  ALKPHOS 575* 545* 507* 463*  BILITOT 0.9 0.7 0.5 0.5  PROT 4.9* 4.6* 4.7* 4.5*  ALBUMIN 2.0* 2.0* 2.0* 1.8*   Coagulation Profile: Recent Labs  Lab 01/26/24 2239  INR 1.0   BNP (last 3 results) Recent Labs    01/26/24 2239  BNP 47.0   Sepsis Labs: Recent Labs  Lab 01/26/24 2255 01/27/24 0024  LATICACIDVEN 0.9 0.7    Recent Results (from the past 240 hours)  Resp panel by RT-PCR (RSV, Flu A&B, Covid) Anterior Nasal Swab     Status: None   Collection Time: 01/26/24 10:39 PM   Specimen: Anterior Nasal Swab  Result Value Ref Range Status   SARS Coronavirus 2 by  RT PCR NEGATIVE NEGATIVE Final    Comment: (NOTE) SARS-CoV-2 target nucleic acids are NOT DETECTED.  The SARS-CoV-2 RNA is generally detectable in upper respiratory specimens during the acute phase of infection. The lowest concentration of SARS-CoV-2 viral copies this assay can detect  is 138 copies/mL. A negative result does not preclude SARS-Cov-2 infection and should not be used as the sole basis for treatment or other patient management decisions. A negative result may occur with  improper specimen collection/handling, submission of specimen other than nasopharyngeal swab, presence of viral mutation(s) within the areas targeted by this assay, and inadequate number of viral copies(<138 copies/mL). A negative result must be combined with clinical observations, patient history, and epidemiological information. The expected result is Negative.  Fact Sheet for Patients:  BloggerCourse.com  Fact Sheet for Healthcare Providers:  SeriousBroker.it  This test is no t yet approved or cleared by the [place] FDA and  has been authorized for detection and/or diagnosis of SARS-CoV-2 by FDA under an Emergency Use Authorization (EUA). This EUA will remain  in effect (meaning this test can be used) for the duration of the COVID-19 declaration under Section 564(b)(1) of the Act, 21 U.S.C.section 360bbb-3(b)(1), unless the authorization is terminated  or revoked sooner.       Influenza A by PCR NEGATIVE NEGATIVE Final   Influenza B by PCR NEGATIVE NEGATIVE Final    Comment: (NOTE) The Xpert Xpress SARS-CoV-2/FLU/RSV plus assay is intended as an aid in the diagnosis of influenza from Nasopharyngeal swab specimens and should not be used as a sole basis for treatment. Nasal washings and aspirates are unacceptable for Xpert Xpress SARS-CoV-2/FLU/RSV testing.  Fact Sheet for Patients: BloggerCourse.com  Fact Sheet  for Healthcare Providers: SeriousBroker.it  This test is not yet approved or cleared by the [place] FDA and has been authorized for detection and/or diagnosis of SARS-CoV-2 by FDA under an Emergency Use Authorization (EUA). This EUA will remain in effect (meaning this test can be used) for the duration of the COVID-19 declaration under Section 564(b)(1) of the Act, 21 U.S.C. section 360bbb-3(b)(1), unless the authorization is terminated or revoked.     Resp Syncytial Virus by PCR NEGATIVE NEGATIVE Final    Comment: (NOTE) Fact Sheet for Patients: BloggerCourse.com  Fact Sheet for Healthcare Providers: SeriousBroker.it  This test is not yet approved or cleared by the [place] FDA and has been authorized for detection and/or diagnosis of SARS-CoV-2 by FDA under an Emergency Use Authorization (EUA). This EUA will remain in effect (meaning this test can be used) for the duration of the COVID-19 declaration under Section 564(b)(1) of the Act, 21 U.S.C. section 360bbb-3(b)(1), unless the authorization is terminated or revoked.  Performed at [facility], [address], [place], [place] [address]   Blood culture (routine x 2)     Status: None (Preliminary result)   Collection Time: 01/26/24 10:39 PM   Specimen: BLOOD  Result Value Ref Range Status   Specimen Description   Final    BLOOD PORTA CATH Performed at [facility], [address], [place], [place] [address]    Special Requests   Final    BOTTLES DRAWN AEROBIC AND ANAEROBIC Blood Culture results may not be optimal due to an inadequate volume of blood received in culture bottles Performed at [facility], [address], [place], [place] [address]    Culture   Final    NO GROWTH 4 DAYS Performed at [facility], [address] [place], [place] [address]    Report Status  PENDING  Incomplete  MRSA Next Gen by PCR, Nasal     Status: None   Collection Time: 01/27/24  9:27 AM   Specimen: Nasal Mucosa; Nasal Swab  Result Value Ref Range Status   MRSA by PCR Next Gen NOT DETECTED NOT DETECTED Final    Comment: (NOTE) The GeneXpert MRSA Assay (FDA approved for NASAL specimens only), is one component of a comprehensive MRSA colonization surveillance program. It is not intended to diagnose MRSA infection nor to guide or monitor treatment for MRSA infections. Test performance is not FDA approved in patients less than 11 years old. Performed at [facility], [address], [place], [place] [address]      Radiology Studies: DG CHEST PORT 1 VIEW Result Date: 01/28/2024 CLINICAL DATA:  Status post left-sided chest tube placement. EXAM: PORTABLE CHEST 1 VIEW COMPARISON:  January 26, 2024. FINDINGS: The heart size and mediastinal contours are within normal limits. Right internal jugular Port-A-Cath is unchanged. Stable bilateral lung opacities are noted concerning for pneumonia or edema. There is been interval placement bilateral chest tubes. Right pleural effusion is slightly decreased compared to prior exam. Small left pleural effusion remains. Small bilateral apical pneumothoraces are noted. The visualized skeletal structures are unremarkable. IMPRESSION: Interval placement of bilateral chest tubes with small bilateral apical pneumothoraces noted. Right pleural effusion is slightly decreased compared to prior exam. Grossly stable small left pleural effusion is noted. Stable bilateral lung opacities as noted above. Electronically Signed   By: Lupita Raider M.D.   On: 01/28/2024 18:53   MR BRAIN W WO CONTRAST Result Date: 01/28/2024 CLINICAL DATA:  Breast cancer, invasive, stage IV. EXAM: MRI HEAD WITHOUT AND WITH CONTRAST TECHNIQUE: Multiplanar, multiecho pulse sequences of the brain and surrounding structures were obtained without and with intravenous  contrast. CONTRAST:  6mL GADAVIST GADOBUTROL 1 MMOL/ML IV SOLN COMPARISON:  11/20/2021.  05/18/2021. FINDINGS: Brain: No evidence residual or new metastatic disease. The only imaging residua of the previously treated disease are small foci of encephalomalacia at the posterior right cerebellum and the lateral left cerebellum. No evidence of residual enhancing tissue. No evidence of stroke, hemorrhage, hydrocephalus or extra-axial collection. No other abnormal brain or leptomeningeal enhancement occurs. Vascular: Major vessels at the base of the brain show flow. Skull and upper cervical spine: Negative Sinuses/Orbits: Clear/normal Other: None IMPRESSION: No evidence of residual or new metastatic disease. Small foci of encephalomalacia at the posterior right cerebellum and the lateral left cerebellum, sequela of previously treated lesions. Electronically Signed   By: Paulina Fusi M.D.   On: 01/28/2024 10:54    Scheduled Meds:  lipase/protease/amylase  24,000 Units Oral TID AC   senna  1 tablet Oral BID   Continuous Infusions:   LOS: 3 days   Time spent: 40 minutes  Carollee Herter, DO  [facility]  01/30/2024, 9:24 AM

## 2024-01-30 NOTE — Plan of Care (Signed)

## 2024-01-30 NOTE — TOC Initial Note (Signed)
 Transition of Care [facility]) - Initial/Assessment Note    Patient Details  Name: Taylor Flynn MRN: [id number] Date of Birth: Dec 22, 1975  Transition of Care [facility]) CM/SW Contact:    Adrian Prows, RN Phone Number: 01/30/2024, 10:14 AM  Clinical Narrative:                 [facility] consult for d/c planning; services in place for [facility]; spoke w/ Norm Parcel at agency; she says pt has hospital bed, BSC, [place], BS table, alternating pressure mat, oxygen through agency [facility]; pt also has HHRN through agency for pleurix catheter drainage; Cheri says per pt's sister [name] all DME has been delivered; spoke w/ pt in room; she says her sister will provide transportation; no TOC needs.   Expected Discharge Plan: Home w Hospice Care Barriers to Discharge: No Barriers Identified   Patient Goals and CMS Choice Patient states their goals for this hospitalization and ongoing recovery are:: home          Expected Discharge Plan and Services   Discharge Planning Services: CM Consult   Living arrangements for the past 2 months: Single Family Home Expected Discharge Date: 01/29/24                                    Prior Living Arrangements/Services Living arrangements for the past 2 months: Single Family Home Lives with:: Relatives Patient language and need for interpreter reviewed:: Yes Do you feel safe going back to the place where you live?: Yes      Need for Family Participation in Patient Care: Yes (Comment) Care giver support system in place?: Yes (comment) Current home services: DME (hospital bed, BSC, [place], BS table, alternating pressure mat, oxygen, RN thru [facility]) Criminal Activity/Legal Involvement Pertinent to Current Situation/Hospitalization: No - Comment as needed  Activities of Daily Living   ADL Screening (condition at time of admission) Independently performs ADLs?: No Does the patient have a NEW difficulty with  bathing/dressing/toileting/self-feeding that is expected to last >3 days?: No Does the patient have a NEW difficulty with getting in/out of bed, walking, or climbing stairs that is expected to last >3 days?: No Does the patient have a NEW difficulty with communication that is expected to last >3 days?: No Is the patient deaf or have difficulty hearing?: No Does the patient have difficulty seeing, even when wearing glasses/contacts?: No Does the patient have difficulty concentrating, remembering, or making decisions?: No  Permission Sought/Granted Permission sought to share information with : Case Manager Permission granted to share information with : Yes, Verbal Permission Granted  Share Information with NAME: Case Manager     Permission granted to share info w Relationship: Jennylee Uehara (sister) [phone]     Emotional Assessment Appearance:: Appears stated age Attitude/Demeanor/Rapport: Gracious Affect (typically observed): Accepting Orientation: : Oriented to Self, Oriented to Place, Oriented to  Time, Oriented to Situation Alcohol / Substance Use: Not Applicable Psych Involvement: No (comment)  Admission diagnosis:  Shortness of breath [R06.02] Bilateral pleural effusion [J90] Acute respiratory failure with hypoxia (HCC) [J96.01] Acute hypoxic respiratory failure (HCC) [J96.01] Patient Active Problem List   Diagnosis Date Noted   Pain 01/29/2024   DNR (do not resuscitate) discussion 01/29/2024   S/P chest tube placement [facility]) - bilateral PleurX catheter placed 01-28-2024 by PCCM. 01/28/2024   Pressure injury of skin 01/28/2024   Abdominal ascites 01/28/2024   Acute  hypoxic respiratory failure (HCC) 01/27/2024   Iron deficiency anemia 01/27/2024   Edema of right upper extremity 01/27/2024   Elevated LFTs 01/27/2024   Need for emotional support 01/27/2024   Counseling and coordination of care 01/27/2024   Bilateral pleural effusion 01/27/2024   Palliative care encounter  01/27/2024   Metastasis from breast cancer [facility]) 01/27/2024   Malignant neoplasm metastatic to brain [facility]) 11/13/2020   Goals of care, counseling/discussion 11/13/2020   Breast cancer metastasized to liver, unspecified laterality (HCC) 10/08/2018   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 [place] [address] Phone: [phone] Fax: [phone]     Social Drivers of Health (SDOH) Social History: SDOH Screenings   Food Insecurity: No Food Insecurity (01/27/2024)  Housing: Low Risk  (01/27/2024)  Recent Concern: Housing - High Risk (01/17/2024)   Received from [facility]  Transportation Needs: No Transportation Needs (01/27/2024)  Recent Concern: Transportation Needs - Unmet Transportation Needs (01/17/2024)   Received from [facility]  Utilities: At Risk (01/27/2024)  Financial Resource Strain: Low Risk  (01/17/2024)   Received from [facility]  Recent Concern: Financial Resource Strain - Medium Risk (12/31/2023)   Received from [facility]  Social Connections: Unknown (03/19/2022)   Received from [facility]  Tobacco Use: Low Risk  (01/27/2024)   SDOH Interventions:     Readmission Risk Interventions     No data to display

## 2024-01-30 NOTE — Assessment & Plan Note (Signed)
 01-30-2024 made DNR by oncology today. I completed a DNR form for pt to take home.

## 2024-01-30 NOTE — Progress Notes (Signed)
 I had a very long talk with Dr. Logan Bores this morning.  I explained to her what I thought the prognosis for a was at this point.  I think the real key is the fact that her prealbumin is only 8.  At this low level, I do not see that there is any role for treatment.  I do not think her body would respond to treatment and would actually worsen her situation.  I told her that in a patient who has metastatic cancer, I have never seen a patient with a prealbumin less than 10 live more than 6 weeks.  I really think this is what we are looking at with her.  She is weak.  She is deconditioned.  I really believe that when she goes home, Hospice is the best way to try to help her.  I think this was very difficult for her to hear.  I totally understand this.  However, I promised her that I would be honest with her whether the news was good or the news was not good.  I really believe that I do respect to her, she needs to know where she stands.  Again, I do not see any role for treatment at this point.  I think it is a matter of making sure that she has comfort, respect, and dignity.  I know that she has tried hard.  I know that she has done things her way and she is felt confident in this.  I have totally respected this.  Hopefully she will go home today.  I just want her to be able to have some semblance of quality of life.  I know that Hospice will do a good job and trying to make sure that she has comfort.  There are no labs on her today.  Her vital signs show temperature 98.6.  Pulse 117.  Blood pressure 113/82.  Her lungs sound pretty clear bilaterally.  I do not hear any wheezing.  There may be a little bit decreased at the bases.  Cardiac exam is tachycardic but regular.  Abdomen is slightly distended.  Bowel sounds are decreased.  There is no guarding or rebound tenderness.  There is some hepatomegaly.  Extremity shows symmetric muscle atrophy in upper lower extremities.  Neurological exam shows no  focal neurological deficits.  We did talk about her desire to be kept alive on machines.  Given the fact that she now has an appreciation as to what the prognosis is, she has decided not to be kept alive artificially.  She does not want to have any heroic measures taken to sustain her life.  I totally agree with this.  As such, she is a DO NOT RESUSCITATE.  I know this has been incredibly difficult for her to hear.  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  I think that she would have done the same for any of her patients.  Hopefully, she will be able to go home today.  I told her that we will be more than happy to see her in the office if she has any problems.  She is post to come to see me on 02/02/2024.  Whether or not she feels that she needs to be at that appointment, I will certainly leave up to her and respect that.  I know that the staff have done a really good job taking care  of her.  They have is shown a lot of compassion.   Taylor Bach, MD  2 Timothy 4: 6-8

## 2024-01-31 LAB — CULTURE, BLOOD (ROUTINE X 2): Culture: NO GROWTH

## 2024-02-02 ENCOUNTER — Inpatient Hospital Stay: Attending: Hematology & Oncology

## 2024-02-02 ENCOUNTER — Inpatient Hospital Stay: Admitting: Hematology & Oncology

## 2024-02-05 ENCOUNTER — Encounter (HOSPITAL_BASED_OUTPATIENT_CLINIC_OR_DEPARTMENT_OTHER): Payer: Self-pay | Admitting: Pulmonary Disease

## 2024-02-05 ENCOUNTER — Ambulatory Visit (HOSPITAL_BASED_OUTPATIENT_CLINIC_OR_DEPARTMENT_OTHER): Admitting: Pulmonary Disease

## 2024-02-05 VITALS — BP 112/68 | HR 117 | Ht 64.0 in | Wt 100.0 lb

## 2024-02-05 DIAGNOSIS — J910 Malignant pleural effusion: Secondary | ICD-10-CM | POA: Diagnosis not present

## 2024-02-05 DIAGNOSIS — J9611 Chronic respiratory failure with hypoxia: Secondary | ICD-10-CM

## 2024-02-05 DIAGNOSIS — C50919 Malignant neoplasm of unspecified site of unspecified female breast: Secondary | ICD-10-CM

## 2024-02-05 NOTE — Progress Notes (Signed)
 Subjective:    Patient ID: Taylor Flynn, female    DOB: [birth year], 48 y.o.   MRN: [id number]  HPI   48 y.o. F with PMH of metastatic breast Ca for follow-up of bilateral pneumonia.  Status post bilateral Pleurx catheters.   most recently treated with cycle 1 of [place] on January 13, 2024 and previously treated by Dr. Myna Hidalgo before moving to [facility] and receiving treatment there.  She was hospitalized there three weeks ago with bilateral pleural effusions that required bilateral chest tube placement.  She has decided to move back to [place] to be closer to family.  Over the last few days she noted worsening shortness of breath and pleuritic CP so presented to the ED.  She was found to have re-accumulation of bilateral effusions therefore PCCM was consulted.  She has a scheduled follow up appt with Dr. Myna Hidalgo.  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  She has drained twice at home , about 1 L from the right side she complains of considerable pain especially on the right side  Accompanied by her sister today who is a surgical tech  Review of Systems neg for any significant sore throat, dysphagia, itching, sneezing, nasal congestion or excess/ purulent secretions, fever, chills, sweats, unintended wt loss, pleuritic or exertional cp, hempoptysis, orthopnea pnd or change in chronic leg swelling. Also denies presyncope, palpitations, heartburn, abdominal pain, nausea, vomiting, diarrhea or change in bowel or urinary habits, dysuria,hematuria, rash, arthralgias, visual complaints, headache, numbness weakness or ataxia.     Objective:   Physical Exam  Gen. 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 She manages drainage at home with hospice support. The right side drains more than the left, necessitating more frequent interventions. Ice packs are recommended over heat due to inflammation. Monitoring for infection is crucial. - Drain the right pleural effusion every other day or three times a week if draining more than 500 mL. - Drain the left pleural effusion twice a week. - Removed sutures today to alleviate pain. - Apply ice packs to reduce inflammation and pain. - Use a soft pillow for support when lying down. - Monitor for signs of infection such as increased redness or discharge. - Contact healthcare provider if drainage is less than 500 mL to adjust frequency.  chronic resp failure with hypoxia  Oxygen saturation is at 93%, which is acceptable but could be improved. Pleural effusions contribute to decreased oxygen levels. Managing effusions should help improve oxygenation. Supplemental oxygen is used for comfort. - Monitor oxygen saturation levels. - Use supplemental oxygen as needed to maintain comfort.  Pain management Experiences significant pain associated with pleural effusions and catheter sites. Oxycodone is used for pain management. Removal of sutures may help alleviate some pain. Ice packs are advised  instead of heat due to inflammation. - Continue oxycodone for pain management. - Remove sutures to alleviate pain. - Apply ice packs to reduce inflammation and pain. - Use a soft pillow for support when lying down.

## 2024-02-05 NOTE — Patient Instructions (Signed)
 Drain RT thrice/ week Drain LT twice/week  Call with questions

## 2024-02-06 ENCOUNTER — Other Ambulatory Visit: Payer: Self-pay

## 2024-02-13 ENCOUNTER — Other Ambulatory Visit (HOSPITAL_COMMUNITY): Payer: Self-pay | Admitting: Hospice and Palliative Medicine

## 2024-02-13 DIAGNOSIS — R180 Malignant ascites: Secondary | ICD-10-CM

## 2024-02-17 ENCOUNTER — Encounter: Payer: Self-pay | Admitting: Family

## 2024-02-17 ENCOUNTER — Encounter: Payer: Self-pay | Admitting: Hematology & Oncology

## 2024-02-18 ENCOUNTER — Ambulatory Visit (HOSPITAL_COMMUNITY)
Admission: RE | Admit: 2024-02-18 | Discharge: 2024-02-18 | Disposition: A | Discharge status: Home / Self Care | Source: Ambulatory Visit | Attending: Hospice and Palliative Medicine | Admitting: Hospice and Palliative Medicine

## 2024-02-18 DIAGNOSIS — Z853 Personal history of malignant neoplasm of breast: Secondary | ICD-10-CM | POA: Insufficient documentation

## 2024-02-18 DIAGNOSIS — R180 Malignant ascites: Secondary | ICD-10-CM | POA: Diagnosis present

## 2024-02-18 MED ORDER — LIDOCAINE HCL 1 % IJ SOLN
INTRAMUSCULAR | Status: AC
  Filled 2024-02-18: qty 20

## 2024-02-18 NOTE — Procedures (Signed)
 PROCEDURE SUMMARY:  Successful image-guided paracentesis from the right lower abdomen.  Yielded 2 liters of blood tinged fluid.  No immediate complications.  EBL: trace Patient tolerated well.   Specimen not sent for labs.  Please see imaging section of Epic for full dictation.  Damian Duke Brooks Kinnan PA-C 02/18/2024 12:02 PM

## 2024-02-24 ENCOUNTER — Ambulatory Visit: Payer: Self-pay

## 2024-02-24 NOTE — Telephone Encounter (Signed)
 FYI

## 2024-02-24 NOTE — Telephone Encounter (Signed)
 E2C2 Pulmonary Triage - Initial Assessment Questions "Chief Complaint (e.g., cough, sob, wheezing, fever, chills, sweat or additional symptoms) *Go to specific symptom protocol after initial questions.  Redness, Discomfort, Pain to the [facility] Nurse is addressing symptoms along with company. [facility]. The patient is only asking that Dr. Villa Greaser is notified.   "How long have symptoms been present? 3-4 Days  Have you tested for COVID or Flu? Note: If not, ask patient if a home test can be taken. If so, instruct patient to call back for positive results. No  MEDICINES:   "Have you used any OTC meds to help with symptoms?" Yes If yes, ask "What medications?" Unsure of medication at this time.    Reason for Disposition  [1] Follow-up call to recent contact AND [2] information only call, no triage required  Answer Assessment - Initial Assessment Questions 1. REASON FOR CALL or QUESTION: "What is your reason for calling today?" or "How can I best help you?" or "What question do you have that I can help answer?"     Drain Site, Redness, Discomfort, and Pain to the site.  Protocols used: Information Only Call - No Triage-A-AH

## 2024-02-27 ENCOUNTER — Other Ambulatory Visit (HOSPITAL_COMMUNITY): Payer: Self-pay | Admitting: Hospice and Palliative Medicine

## 2024-02-27 DIAGNOSIS — R180 Malignant ascites: Secondary | ICD-10-CM

## 2024-03-02 ENCOUNTER — Ambulatory Visit (HOSPITAL_COMMUNITY)
Admission: RE | Admit: 2024-03-02 | Discharge: 2024-03-02 | Disposition: A | Discharge status: Home / Self Care | Source: Ambulatory Visit | Attending: Hospice and Palliative Medicine | Admitting: Hospice and Palliative Medicine

## 2024-03-02 DIAGNOSIS — R180 Malignant ascites: Secondary | ICD-10-CM | POA: Diagnosis present

## 2024-03-02 DIAGNOSIS — Z5309 Procedure and treatment not carried out because of other contraindication: Secondary | ICD-10-CM | POA: Insufficient documentation

## 2024-03-02 DIAGNOSIS — Z853 Personal history of malignant neoplasm of breast: Secondary | ICD-10-CM | POA: Insufficient documentation

## 2024-03-02 MED ORDER — LIDOCAINE HCL 1 % IJ SOLN
INTRAMUSCULAR | Status: AC
  Filled 2024-03-02: qty 20

## 2024-03-02 NOTE — Procedures (Signed)
 Attempted but ultimately aborted therapeutic paracentesis secondary to insufficient volume of ascites to allow for safe passage of catheter due to close proximity to bowel loops.  Medication used-3 mL 1% lidocaine .  No immediate complications.  EBL none.

## 2024-04-04 DEATH — deceased

## 2024-05-05 ENCOUNTER — Encounter: Payer: Self-pay | Admitting: Hematology & Oncology

## 2024-05-05 ENCOUNTER — Encounter: Payer: Self-pay | Admitting: Family

## 2024-05-12 ENCOUNTER — Encounter (HOSPITAL_BASED_OUTPATIENT_CLINIC_OR_DEPARTMENT_OTHER): Payer: Self-pay

## 2024-05-12 ENCOUNTER — Ambulatory Visit (HOSPITAL_BASED_OUTPATIENT_CLINIC_OR_DEPARTMENT_OTHER)

## 2024-05-12 NOTE — Progress Notes (Deleted)
 @Patient  ID: Taylor Flynn, female    DOB: [birth year], 48 y.o.   MRN: [id number]  No chief complaint on file.   Referring provider: Timmy Maude SAUNDERS, MD  HPI: Patient is a 48 y/o female with PMH of metastatic breast CA s/p bilateral Pleurex catheters for malignant effusions who presents today for a follow up appointment.  She was last seen in our office on 02/05/2024.  Briefly, she was treated by Dr. Timmy before a brief move to [facility] for treatment.  She has returned to the area to be closer to family.  She has had issues with re-accumulation of pleural effusions, therefore received bilateral Pleurex catheters; these were confirmed to be malignant in [place].  She is currently under the care of Hospice.  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     Buprenorphine  HCl 75 MCG FILM Place 1 Film inside cheek 2 (two) times daily as needed (For PAin).     dexamethasone  (DECADRON ) 4  MG tablet Take 4 mg by mouth as directed.  Take 1 tablet (4 mg total) by mouth 2 times a day with breakfast & dinner for three days starting the day after outpatient clinic chemotherapy infusion     ibuprofen  (ADVIL ) 200 MG tablet Take 200 mg by mouth every 6 (six) hours as needed for mild pain (pain score 1-3) or moderate pain (pain score 4-6).     metroNIDAZOLE (METROGEL) 1 % gel Apply 1 Application topically daily.     Multiple Vitamins-Minerals (ZINC PO) Take 1 tablet by mouth daily at 6 (six) AM.     PRESCRIPTION MEDICATION Inject 1 mL into the skin every Monday, Wednesday, and Friday. mistletoe     prochlorperazine  (COMPAZINE ) 10 MG tablet Take 10 mg by mouth every 6 (six) hours as needed for nausea or vomiting.     senna (SENOKOT) 8.6 MG tablet Take 1 tablet by mouth 2 (two) times daily.     TURMERIC CURCUMIN PO Take 1 tablet by mouth daily at 6 (six) AM.     No facility-administered medications prior to visit.     Review of Systems:   Constitutional:   No  weight loss, night sweats,  Fevers, chills, fatigue, or  lassitude.  HEENT:   No headaches,  Difficulty swallowing,  Tooth/dental problems, or  Sore throat,                No sneezing, itching, ear ache, nasal congestion, post nasal  drip,   CV:  No chest pain,  Orthopnea, PND, swelling in lower extremities, anasarca, dizziness, palpitations, syncope.   GI  No heartburn, indigestion, abdominal pain, nausea, vomiting, diarrhea, change in bowel habits, loss of appetite, bloody stools.   Resp: No shortness of breath with exertion or at rest.  No excess mucus, no productive cough,  No non-productive cough,  No coughing up of blood.  No change in color of mucus.  No wheezing.  No chest wall deformity  Skin: no rash or lesions.  GU: no dysuria, change in color of urine, no urgency or frequency.  No flank pain, no hematuria   MS:  No joint pain or swelling.  No decreased range of motion.  No back pain.    Physical Exam  LMP  12/07/2018   GEN: A/Ox3; pleasant , NAD, well nourished    HEENT:  [place]/AT,  EACs-clear, TMs-wnl, NOSE-clear, THROAT-clear, no lesions, no postnasal drip or exudate noted.   NECK:  Supple w/ fair ROM; no JVD; normal carotid impulses w/o bruits; no thyromegaly or nodules palpated; no lymphadenopathy.    RESP  Clear  P & A; w/o, wheezes/ rales/ or rhonchi. no accessory muscle use, no dullness to percussion  CARD:  RRR, no m/r/g, no peripheral edema, pulses intact, no cyanosis or clubbing.  GI:   Soft & nt; nml bowel sounds; no organomegaly or masses detected.   Musco: Warm bil, no deformities or joint swelling noted.   Neuro: alert, no focal deficits noted.    Skin: Warm, no lesions or rashes    Lab Results:  CBC    Component Value Date/Time   WBC 3.7 (L) 01/29/2024 0617   RBC 2.90 (L) 01/29/2024 0617   HGB 7.0 (L) 01/29/2024 0617   HGB 11.0 (L) 06/18/2022 0945   HCT 24.4 (L) 01/29/2024 0617   PLT 833 (H) 01/29/2024 0617   PLT 238 06/18/2022 0945   MCV 84.1 01/29/2024 0617   MCH 24.1 (L) 01/29/2024 0617   MCHC 28.7 (L) 01/29/2024 0617   RDW 17.5 (H) 01/29/2024 0617   LYMPHSABS 0.7 01/29/2024 0617   MONOABS 0.4 01/29/2024 0617   EOSABS 0.3 01/29/2024 0617   BASOSABS 0.1 01/29/2024 0617    BMET    Component Value Date/Time   NA 134 (L) 01/29/2024 0617   K 3.8 01/29/2024 0617   CL 103 01/29/2024 0617   CO2 24 01/29/2024 0617   GLUCOSE 59 (L) 01/29/2024 0617   BUN 23 (H) 01/29/2024 0617   CREATININE 0.79 01/29/2024 0617   CREATININE 0.93 06/18/2022 0945   CALCIUM 8.0 (L) 01/29/2024 0617   GFRNONAA >60 01/29/2024 0617   GFRNONAA >60 06/18/2022 0945   GFRAA >60 01/13/2020 0905    BNP    Component Value Date/Time   BNP 47.0 01/26/2024 2239    ProBNP No results found for: PROBNP  Imaging: No results found.  Administration History     None           No data to display          No results found for: NITRICOXIDE      Assessment & Plan:     Metastatic breast cancer with bilateral malignant effusions s/p bilateral Pleurex catheter placement: Chronic respiratory failure with hypoxia Pain management     Candis Dandy, PA-C 05/12/2024
